# Patient Record
Sex: Female | Born: 1935 | Race: White | Hispanic: No | Smoking: Never smoker
Health system: Southern US, Community
[De-identification: ages and names within clinical notes are randomized; demographics above are authoritative.]

## PROBLEM LIST (undated history)

## (undated) DIAGNOSIS — I4891 Unspecified atrial fibrillation: Secondary | ICD-10-CM

## (undated) DIAGNOSIS — Z950 Presence of cardiac pacemaker: Secondary | ICD-10-CM

## (undated) DIAGNOSIS — I5189 Other ill-defined heart diseases: Secondary | ICD-10-CM

## (undated) DIAGNOSIS — I2510 Atherosclerotic heart disease of native coronary artery without angina pectoris: Secondary | ICD-10-CM

## (undated) DIAGNOSIS — I509 Heart failure, unspecified: Secondary | ICD-10-CM

## (undated) DIAGNOSIS — I495 Sick sinus syndrome: Secondary | ICD-10-CM

## (undated) DIAGNOSIS — F0390 Unspecified dementia without behavioral disturbance: Secondary | ICD-10-CM

## (undated) DIAGNOSIS — K922 Gastrointestinal hemorrhage, unspecified: Secondary | ICD-10-CM

## (undated) DIAGNOSIS — I10 Essential (primary) hypertension: Secondary | ICD-10-CM

## (undated) HISTORY — DX: Essential (primary) hypertension: I10

## (undated) HISTORY — DX: Unspecified atrial fibrillation: I48.91

## (undated) HISTORY — DX: Sick sinus syndrome: I49.5

## (undated) HISTORY — DX: Unspecified dementia, unspecified severity, without behavioral disturbance, psychotic disturbance, mood disturbance, and anxiety: F03.90

## (undated) HISTORY — DX: Heart failure, unspecified: I50.9

## (undated) HISTORY — DX: Gastrointestinal hemorrhage, unspecified: K92.2

## (undated) HISTORY — DX: Other ill-defined heart diseases: I51.89

---

## 2005-08-13 ENCOUNTER — Emergency Department (HOSPITAL_COMMUNITY): Admission: EM | Admit: 2005-08-13 | Discharge: 2005-08-14 | Payer: Self-pay | Admitting: Emergency Medicine

## 2009-05-24 ENCOUNTER — Ambulatory Visit: Payer: Self-pay | Admitting: Infectious Diseases

## 2009-05-24 ENCOUNTER — Ambulatory Visit: Payer: Self-pay | Admitting: Vascular Surgery

## 2009-05-26 ENCOUNTER — Encounter (INDEPENDENT_AMBULATORY_CARE_PROVIDER_SITE_OTHER): Payer: Self-pay | Admitting: Emergency Medicine

## 2009-05-27 ENCOUNTER — Inpatient Hospital Stay (HOSPITAL_COMMUNITY): Admission: EM | Admit: 2009-05-27 | Discharge: 2009-05-28 | Payer: Self-pay | Admitting: Emergency Medicine

## 2009-08-08 ENCOUNTER — Emergency Department (HOSPITAL_COMMUNITY): Admission: EM | Admit: 2009-08-08 | Discharge: 2009-08-08 | Payer: Self-pay | Admitting: Emergency Medicine

## 2009-08-23 ENCOUNTER — Encounter (INDEPENDENT_AMBULATORY_CARE_PROVIDER_SITE_OTHER): Payer: Self-pay | Admitting: Internal Medicine

## 2009-08-23 ENCOUNTER — Inpatient Hospital Stay (HOSPITAL_COMMUNITY): Admission: EM | Admit: 2009-08-23 | Discharge: 2009-09-05 | Payer: Self-pay | Admitting: Emergency Medicine

## 2009-08-26 ENCOUNTER — Ambulatory Visit: Payer: Self-pay | Admitting: Vascular Surgery

## 2009-08-26 ENCOUNTER — Encounter (INDEPENDENT_AMBULATORY_CARE_PROVIDER_SITE_OTHER): Payer: Self-pay | Admitting: Internal Medicine

## 2009-09-02 HISTORY — PX: PACEMAKER INSERTION: SHX728

## 2010-01-09 ENCOUNTER — Inpatient Hospital Stay (HOSPITAL_COMMUNITY): Admission: EM | Admit: 2010-01-09 | Discharge: 2010-01-14 | Payer: Self-pay | Admitting: Emergency Medicine

## 2010-01-13 ENCOUNTER — Encounter (INDEPENDENT_AMBULATORY_CARE_PROVIDER_SITE_OTHER): Payer: Self-pay | Admitting: Internal Medicine

## 2010-04-20 ENCOUNTER — Emergency Department (HOSPITAL_COMMUNITY): Admission: EM | Admit: 2010-04-20 | Discharge: 2010-04-20 | Payer: Self-pay | Admitting: Emergency Medicine

## 2010-04-28 ENCOUNTER — Ambulatory Visit: Payer: Self-pay | Admitting: Cardiology

## 2010-06-06 ENCOUNTER — Encounter: Payer: Self-pay | Admitting: Internal Medicine

## 2010-06-17 ENCOUNTER — Emergency Department (HOSPITAL_COMMUNITY): Admission: EM | Admit: 2010-06-17 | Discharge: 2010-06-17 | Payer: Self-pay | Admitting: Emergency Medicine

## 2010-07-09 ENCOUNTER — Ambulatory Visit: Payer: Self-pay | Admitting: Internal Medicine

## 2010-08-28 ENCOUNTER — Ambulatory Visit: Payer: Self-pay | Admitting: Cardiology

## 2010-09-25 ENCOUNTER — Inpatient Hospital Stay (HOSPITAL_COMMUNITY)
Admission: EM | Admit: 2010-09-25 | Discharge: 2010-09-28 | Payer: Self-pay | Source: Home / Self Care | Attending: Internal Medicine | Admitting: Internal Medicine

## 2010-09-25 LAB — CBC
Hemoglobin: 9.1 g/dL — ABNORMAL LOW (ref 12.0–15.0)
MCHC: 34 g/dL (ref 30.0–36.0)
RBC: 3.14 MIL/uL — ABNORMAL LOW (ref 3.87–5.11)
WBC: 5.4 10*3/uL (ref 4.0–10.5)

## 2010-09-25 LAB — DIFFERENTIAL
Basophils Absolute: 0 10*3/uL (ref 0.0–0.1)
Basophils Relative: 1 % (ref 0–1)
Monocytes Absolute: 0.7 10*3/uL (ref 0.1–1.0)
Neutro Abs: 2.2 10*3/uL (ref 1.7–7.7)
Neutrophils Relative %: 40 % — ABNORMAL LOW (ref 43–77)

## 2010-09-25 LAB — PROTIME-INR
INR: 1.04 (ref 0.00–1.49)
Prothrombin Time: 13.8 seconds (ref 11.6–15.2)

## 2010-09-25 LAB — COMPREHENSIVE METABOLIC PANEL
ALT: 21 U/L (ref 0–35)
Calcium: 8.9 mg/dL (ref 8.4–10.5)
Glucose, Bld: 96 mg/dL (ref 70–99)
Sodium: 134 mEq/L — ABNORMAL LOW (ref 135–145)
Total Protein: 5.7 g/dL — ABNORMAL LOW (ref 6.0–8.3)

## 2010-09-25 LAB — DIGOXIN LEVEL: Digoxin Level: 0.4 ng/mL — ABNORMAL LOW (ref 0.8–2.0)

## 2010-09-25 LAB — OCCULT BLOOD, POC DEVICE: Fecal Occult Bld: POSITIVE

## 2010-09-26 LAB — BASIC METABOLIC PANEL
CO2: 25 mEq/L (ref 19–32)
Calcium: 8.4 mg/dL (ref 8.4–10.5)
Chloride: 105 mEq/L (ref 96–112)
GFR calc Af Amer: >60 mL/min (ref >60)
Glucose, Bld: 103 mg/dL — ABNORMAL HIGH (ref 70–99)
Potassium: 3.7 mEq/L (ref 3.5–5.1)
Sodium: 137 mEq/L (ref 135–145)

## 2010-09-26 LAB — CBC
HCT: 23 % — ABNORMAL LOW (ref 36.0–46.0)
Hemoglobin: 7.7 g/dL — ABNORMAL LOW (ref 12.0–15.0)
RBC: 2.71 MIL/uL — ABNORMAL LOW (ref 3.87–5.11)
WBC: 4.5 10*3/uL (ref 4.0–10.5)

## 2010-09-26 LAB — GLUCOSE, CAPILLARY

## 2010-09-26 LAB — PREPARE RBC (CROSSMATCH)

## 2010-09-27 ENCOUNTER — Encounter: Payer: Self-pay | Admitting: Gastroenterology

## 2010-09-27 LAB — BASIC METABOLIC PANEL
BUN: 10 mg/dL (ref 6–23)
Chloride: 107 mEq/L (ref 96–112)
Creatinine, Ser: 0.45 mg/dL (ref 0.4–1.2)
Glucose, Bld: 78 mg/dL (ref 70–99)
Potassium: 3.6 mEq/L (ref 3.5–5.1)

## 2010-09-27 LAB — TYPE AND SCREEN
ABO/RH(D): O POS
Unit division: 0

## 2010-09-27 LAB — CBC
HCT: 28.1 % — ABNORMAL LOW (ref 36.0–46.0)
HCT: 27.1 % — ABNORMAL LOW (ref 36.0–46.0)
Hemoglobin: 9.5 g/dL — ABNORMAL LOW (ref 12.0–15.0)
MCH: 28.4 pg (ref 26.0–34.0)
MCH: 28.6 pg (ref 26.0–34.0)
MCHC: 33.8 g/dL (ref 30.0–36.0)
MCV: 85.2 fL (ref 78.0–100.0)
Platelets: 158 10*3/uL (ref 150–400)
RBC: 3.34 MIL/uL — ABNORMAL LOW (ref 3.87–5.11)
RDW: 14.8 % (ref 11.5–15.5)
WBC: 4.6 10*3/uL (ref 4.0–10.5)

## 2010-09-28 LAB — CBC
HCT: 32 % — ABNORMAL LOW (ref 36.0–46.0)
MCHC: 33.4 g/dL (ref 30.0–36.0)
MCV: 85.6 fL (ref 78.0–100.0)
Platelets: 205 10*3/uL (ref 150–400)
RDW: 14.6 % (ref 11.5–15.5)

## 2010-09-28 NOTE — H&P (Addendum)
Andrea Koch, Andrea Koch             ACCOUNT NO.:  [id number]  MEDICAL RECORD NO.:  [id number]          PATIENT TYPE:  EMS  LOCATION:  ED                           FACILITY:  [facility]  PHYSICIAN:  Peggye Pitt, M.D. DATE OF BIRTH:  1932/08/06  DATE OF ADMISSION:  09/25/2010 DATE OF DISCHARGE:                             HISTORY & PHYSICAL   PRIMARY CARE PHYSICIAN:  Marjory Lies, M.D.  CHIEF COMPLAINTS:  Melena.  HISTORY OF PRESENT ILLNESS:  Andrea Koch is a 77 year old Caucasian lady with a history of sick sinus syndrome, status post pacemaker implantation and insomnia who presents to the hospital today after PCP told her to come back for a low hemoglobin.  Koch of the history is obtained by the patient's son, Andrea Koch as she is somewhat confused of the time line of events.  The patient had been feeling "sick with the flu" for the past 2 to 3 days and because of this went to see her PCP today.  This morning, she had her first episode of black tarry stools. At Dr. Mellody Life office they did a guaiac which was positive and also did some blood work and the patient was sent home.  The patient's son, Andrea Koch was contacted later this evening and asked to bring his mother to the emergency department for findings of hemoglobin of 9.2.  Because of this, he brought his mother into the emergency department today. Charles phone numbers are [phone], [phone], and [phone].  He has asked that we contact him and there tomorrow once we have details of his mother's hospital course.  ALLERGIES:  SHE HAS ALLERGIES TO SULFA DRUGS WHICH CAUSE A RASH.  PAST MEDICAL HISTORY: 1. Significant for sick sinus syndrome. 2. Bilateral lower extremity edema secondary to venous insufficiency. 3. Anemia of chronic disease. 4. History of atrial fibrillation, not maintained on chronic     anticoagulation with Coumadin given history of falls.  HOME MEDICATIONS: 1. Aspirin 81 mg daily. 2. Klonopin 0.5 mg at  bedtime.  She takes half a tablet. 3. Digoxin 0.125 mg daily. 4. Lasix 20 mg daily as needed for lower extremity swelling. 5. Metoprolol 25 mg twice daily. 6. Fluticasone nasal spray 2 sprays in each nostril daily.  SOCIAL HISTORY:  Denies any alcohol, tobacco or illicit drug use.  Lives with her son, Andrea Koch and his wife Andrea Koch.  FAMILY HISTORY:  Nonsignificant for heart disease, stroke and cancer.  REVIEW OF SYSTEMS:  Negative except as mentioned in the history of present illness.  PHYSICAL EXAMINATION:  VITALS:  On admission blood pressure 131/75, heart rate 82, respirations 16, sats of 100% on room air, temperature 97.4. GENERAL:  She is alert, awake, oriented x3, in no distress. HEENT:  Normocephalic, atraumatic.  Her pupils are equally round and reactive to light. NECK:  Supple.  No JVD, no lymphadenopathy, no bruits, no goiter. HEART:  Irregular rhythm.  I do not auscultate any murmurs, rubs or gallops. LUNGS:  Her lungs appear clear to auscultation bilaterally. ABDOMEN:  Soft, nontender, nondistended.  Positive bowel sounds. EXTREMITIES:  No edema and positive pedal pulses. NEUROLOGIC EXAM:  Grossly intact and nonfocal.  LABORATORY  DATA:  Labs on admission, sodium 134, potassium 3.8, chloride 100, bicarb 29, BUN 44, creatinine 0.50, glucose of 96.  All of her LFTs are within normal limits with the exception of a slightly low albumin at 3.1.  WBC is 5.4, hemoglobin 9.1 with an MCV of 85.4, platelet count of 194.  Her coags are within normal limits.  Her digoxin level is subtherapeutic at 0.4 and her FOBT is positive.  ASSESSMENT AND PLAN: 1. Melena.  At this point, we will place 2 large-bore IVs.  We will     cycle her CBCs every 8 hours.  We will type and cross and hold 2     units of PRBCs.  At this point, I do not believe that she requires     transfusion.  I will transfuse if it drops below 7.  Given the fact     that she is hemodynamically stable and her degree of  anemia is not     that significant, I have decided that there is no need to consult     GI emergently overnight and this consult can be obtained in the     morning.  Possibility of endoscopy has been discussed with the     patient and her son, Andrea Koch and they are in agreement.  Please see     H and P for Gary phone numbers.  He wants to be contacted when     we know of the timing of these procedures if they are to be done.     It is likely that the aspirin that she has been taking long-term     for her atrial fibrillation and heart disease may have caused some     degree of gastritis or peptic ulcer disease.  I will give her     Protonix 40 mg IV b.i.d. for now.  We will keep her n.p.o. for now     in anticipation of a possible procedure in the morning. 2. For deep venous thrombosis prophylaxis, I will place her on SCDs.     Peggye Pitt, M.D.     EH/MEDQ  D:  09/25/2010  T:  09/25/2010  Job:  [id number]  cc:   Marjory Lies, M.D. Fax: [phone]  Electronically Signed by Peggye Pitt M.D. on 09/28/2010 08:11:27 AM

## 2010-09-29 NOTE — Discharge Summary (Signed)
Andrea Koch, Andrea Koch             ACCOUNT NO.:  [id number]  MEDICAL RECORD NO.:  [id number]          PATIENT TYPE:  INP  LOCATION:  1306                         FACILITY:  [facility]  PHYSICIAN:  Andreas Blower, MD       DATE OF BIRTH:  [birth year]  DATE OF ADMISSION:  09/25/2010 DATE OF DISCHARGE:                              DISCHARGE SUMMARY   PRIMARY CARE PROVIDER:  Marjory Lies, MD  DISCHARGE DIAGNOSES: 1. 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 2. Hypotension due to GI bleed and dehydration. 3. History of sick sinus syndrome. 4. Anemia due to gastrointestinal bleed and anemia of chronic disease. 5. Hyponatremia 6. History of atrial fibrillation. 7. Hypertension. 8. Bilateral lower extremity edema secondary to venous insufficiency. 9. Dehydration. 10.Dementia.  DISCHARGE MEDICATIONS: 1. Omeprazole 20 mg p.o. twice daily for 1 month, then daily     thereafter. 2. Metoprolol 12.5 mg p.o. twice daily. 3. Clonazepam 0.25 mg p.o. daily at bedtime. 4. Digoxin 0.125 mg p.o. daily. 5. Fluticasone 50 mcg 2 sprays nasally daily. 6. Furosemide 20 mg p.o. daily.  BRIEF ADMITTING HISTORY AND PHYSICAL:  Andrea Koch is a 77 year old Caucasian female with history of sick sinus syndrome, status post pacemaker placement, who presented with melena and low hemoglobin.  PROCEDURES:  The patient had an EGD on September 27, 2010 which showed a clean based gastric ulcer, otherwise normal examination.  This gastric ulcer was deemed most likely the cause for her melena.  RADIOLOGY/IMAGING:  The patient has no imaging.  LABORATORY DATA:  CBC shows white count of 6.9, hemoglobin 10.7, hematocrit 32.0, platelet count 205.  Initially, hemoglobin on presentation was 9.1, dropped to 7.7.  INR was 1.04.  Electrolytes normal with a BUN of 10, creatinine 0.45.  Liver function tests normal except alk phos was 122, total protein was 5.7, albumin was 3.1, and digoxin was 0.4.  DISPOSITION  AND FOLLOWUP:  The patient to follow with Dr. Charna Elizabeth, Gastroenterology, in approximately 2 months and the patient will need repeat EGD at that time to confirm the ulcer is healing.  The patient will need to follow up with her primary care physician in 1-2 weeks. The patient's primary care physician or Dr. Loreta Ave to address the need as to when the aspirin could be restarted.  Her primary care physician to titrate antihypertensive medications as necessary.  HOSPITAL COURSE BY PROBLEM: 1. GI bleed, melena.  The patient received 2 units of PRBC, had an EGD     on September 27, 2010 which showed a clean based gastric ulcer which     is the most likely the cause for her melena.  The patient also had     biopsies and the results are pending.  GI to follow up on the     biopsies for H pylori.  The patient was started on PPI which she     will continue twice daily for 1 month, then daily thereafter.  Will     need a repeat EGD in 2 months to confirm that the gastric ulcer is     healing. 2. Hypotension secondary to GI bleed and  dehydration, improved with IV     hydration and blood. 3. History of sick sinus syndrome, stable.  Status post pacemaker     placement in the past. 4. Anemia due to chronic disease and GI bleed, hemoglobin stable after     transfusion.  No further episodes of bleed. 5. Hyponatremia, resolved. 6. History of atrial fibrillation.  The patient was rate controlled.     Initially held her metoprolol during the course of hospital stay     due to hypotension.  However, after her blood pressure improved,     metoprolol was restarted at half a dose.  Further titration of     antihypertensive medications to be done as an outpatient, will     defer to her primary care physician. 7. Bilateral lower extremity edema secondary to chronic venous stasis     insufficiency, stable during the hospital stay. 8. Dememtia. Stable.  Time spent on discharge talking to the patient, talking to  the patient's son, and coordinating care was 35 minutes.   Andreas Blower, MD   SR/MEDQ  D:  09/28/2010  T:  09/28/2010  Job:  [id number]  cc:   Marjory Lies, M.D. Fax: [phone]  Electronically Signed by Wardell Heath Tatia Petrucci  on 09/29/2010 10:40:39 PM

## 2010-09-29 NOTE — Procedures (Signed)
Summary: pacer ck medtroinc/mt   Current Medications (verified): 1)  Digoxin 0.125 Mg Tabs (Digoxin) .... One By Mouth Daily 2)  Metoprolol Tartrate 25 Mg Tabs (Metoprolol Tartrate) .... One By Mouth Two Times A Day 3)  Aspir-Low 81 Mg Tbec (Aspirin) .... One By Mouth Daily  Allergies (verified): 1)  ! Sulfa  PPM Specifications Following MD:  Hillis Range, MD     Referring MD:  Rolla Plate PPM Vendor:  Medtronic     PPM Model Number:  [id number]     PPM Serial Number:  [id number] PPM DOI:  09/02/2009     PPM Implanting MD:  Rolla Plate  Lead 1    Location: RA     DOI: 09/02/2009     Model #: [id number]     Serial #: [id number]     Status: active Lead 2    Location: RV     DOI: 09/02/2009     Model #: [id number]     Serial #: [id number]     Status: active  Magnet Response Rate:  BOL 85 ERI 65  Indications:  A-fib   PPM Follow Up Remote Check?  No Battery Voltage:  2.79 V     Battery Est. Longevity:  11 years     Pacer Dependent:  Yes       PPM Device Measurements Atrium  Impedance: 654 ohms, Threshold: 0.375 V at 0.4 msec Right Ventricle  Amplitude: 8.0 mV, Impedance: 548 ohms, Threshold: 0.875 V at 0.4 msec  Episodes MS Episodes:  41     Percent Mode Switch:  0.5%     Coumadin:  No Ventricular High Rate:  0     Atrial Pacing:  99.1%     Ventricular Pacing:  1.8%  Parameters Mode:  DDDR     Lower Rate Limit:  60     Upper Rate Limit:  130 Paced AV Delay:  150     Sensed AV Delay:  120 Next Cardiology Appt Due:  12/29/2010 Tech Comments:  No parameter changes. Mode switch episodes 0.5%,- coumadin.  No Carelink @ this time.  ROV 6months with Dr. Johney Frame. Altha Harm, LPN  July 09, 2010 4:42 PM

## 2010-09-29 NOTE — Cardiovascular Report (Signed)
Summary: Office Visit   Office Visit   Imported By: Roderic Ovens 07/20/2010 15:47:02  _____________________________________________________________________  External Attachment:    Type:   Image     Comment:   External Document

## 2010-09-29 NOTE — Miscellaneous (Signed)
Summary: Device preload  Clinical Lists Changes  Observations: Added new observation of PPM INDICATN: A-fib (06/06/2010 15:45) Added new observation of MAGNET RTE: BOL 85 ERI 65 (06/06/2010 15:45) Added new observation of PPMLEADSTAT2: active (06/06/2010 15:45) Added new observation of PPMLEADSER2: [id number] (06/06/2010 15:45) Added new observation of PPMLEADMOD2: 5076  (06/06/2010 15:45) Added new observation of PPMLEADDOI2: 09/02/2009  (06/06/2010 15:45) Added new observation of PPMLEADLOC2: RV  (06/06/2010 15:45) Added new observation of PPMLEADSTAT1: active  (06/06/2010 15:45) Added new observation of PPMLEADSER1: [id number]  (06/06/2010 15:45) Added new observation of PPMLEADMOD1: 5076  (06/06/2010 15:45) Added new observation of PPMLEADDOI1: 09/02/2009  (06/06/2010 15:45) Added new observation of PPMLEADLOC1: RA  (06/06/2010 15:45) Added new observation of PPM IMP MD: Duffy Rhody Tennant,MD  (06/06/2010 15:45) Added new observation of PPM DOI: 09/02/2009  (06/06/2010 15:45) Added new observation of PPM SERL#: [id number]  (06/06/2010 15:45) Added new observation of PPM MODL#: [id number]  (06/06/2010 96:29) Added new observation of PACEMAKERMFG: Medtronic  (06/06/2010 15:45) Added new observation of PPM REFER MD: Duffy Rhody Tennant,MD  (06/06/2010 15:45) Added new observation of PACEMAKER MD: Hillis Range, MD  (06/06/2010 15:45)      PPM Specifications Following MD:  Hillis Range, MD     Referring MD:  Rolla Plate PPM Vendor:  Medtronic     PPM Model Number:  [id number]     PPM Serial Number:  [id number] PPM DOI:  09/02/2009     PPM Implanting MD:  Rolla Plate  Lead 1    Location: RA     DOI: 09/02/2009     Model #: [id number]     Serial #: [id number]     Status: active Lead 2    Location: RV     DOI: 09/02/2009     Model #: [id number]     Serial #: [id number]     Status: active  Magnet Response Rate:  BOL 85 ERI 65  Indications:  A-fib

## 2010-09-30 DIAGNOSIS — K922 Gastrointestinal hemorrhage, unspecified: Secondary | ICD-10-CM

## 2010-09-30 HISTORY — DX: Gastrointestinal hemorrhage, unspecified: K92.2

## 2010-10-06 ENCOUNTER — Emergency Department (HOSPITAL_COMMUNITY)
Admission: EM | Admit: 2010-10-06 | Discharge: 2010-10-07 | Disposition: A | Discharge status: Nursing Home (Includes ICF) | Payer: Medicare Other | Source: Home / Self Care | Attending: Emergency Medicine | Admitting: Emergency Medicine

## 2010-10-06 DIAGNOSIS — I10 Essential (primary) hypertension: Secondary | ICD-10-CM | POA: Insufficient documentation

## 2010-10-06 DIAGNOSIS — I509 Heart failure, unspecified: Secondary | ICD-10-CM | POA: Insufficient documentation

## 2010-10-06 DIAGNOSIS — Z79899 Other long term (current) drug therapy: Secondary | ICD-10-CM | POA: Insufficient documentation

## 2010-10-06 DIAGNOSIS — K922 Gastrointestinal hemorrhage, unspecified: Secondary | ICD-10-CM | POA: Insufficient documentation

## 2010-10-06 DIAGNOSIS — R195 Other fecal abnormalities: Secondary | ICD-10-CM | POA: Insufficient documentation

## 2010-10-06 LAB — COMPREHENSIVE METABOLIC PANEL
ALT: 19 U/L (ref 0–35)
Alkaline Phosphatase: 118 U/L — ABNORMAL HIGH (ref 39–117)
CO2: 30 mEq/L (ref 19–32)
Chloride: 99 mEq/L (ref 96–112)
Glucose, Bld: 87 mg/dL (ref 70–99)
Potassium: 4.1 mEq/L (ref 3.5–5.1)
Sodium: 134 mEq/L — ABNORMAL LOW (ref 135–145)
Total Bilirubin: 0.6 mg/dL (ref 0.3–1.2)
Total Protein: 6.2 g/dL (ref 6.0–8.3)

## 2010-10-06 LAB — PROTIME-INR: Prothrombin Time: 14.3 seconds (ref 11.6–15.2)

## 2010-10-06 LAB — CBC
MCH: 28.3 pg (ref 26.0–34.0)
MCHC: 32.6 g/dL (ref 30.0–36.0)
Platelets: 255 10*3/uL (ref 150–400)
RDW: 14.2 % (ref 11.5–15.5)

## 2010-10-06 LAB — DIFFERENTIAL
Basophils Absolute: 0 10*3/uL (ref 0.0–0.1)
Basophils Relative: 1 % (ref 0–1)
Eosinophils Absolute: 0.3 10*3/uL (ref 0.0–0.7)
Eosinophils Relative: 8 % — ABNORMAL HIGH (ref 0–5)
Monocytes Absolute: 0.5 10*3/uL (ref 0.1–1.0)
Monocytes Relative: 12 % (ref 3–12)

## 2010-10-07 ENCOUNTER — Inpatient Hospital Stay (HOSPITAL_COMMUNITY)
Admission: EM | Admit: 2010-10-07 | Discharge: 2010-10-10 | DRG: 378 | Disposition: A | Discharge status: Skilled Nursing Facility | Payer: Medicare Other | Source: Ambulatory Visit | Attending: Internal Medicine | Admitting: Internal Medicine

## 2010-10-07 DIAGNOSIS — I509 Heart failure, unspecified: Secondary | ICD-10-CM | POA: Diagnosis present

## 2010-10-07 DIAGNOSIS — E871 Hypo-osmolality and hyponatremia: Secondary | ICD-10-CM | POA: Diagnosis present

## 2010-10-07 DIAGNOSIS — I495 Sick sinus syndrome: Secondary | ICD-10-CM | POA: Diagnosis present

## 2010-10-07 DIAGNOSIS — E876 Hypokalemia: Secondary | ICD-10-CM | POA: Diagnosis present

## 2010-10-07 DIAGNOSIS — Z950 Presence of cardiac pacemaker: Secondary | ICD-10-CM

## 2010-10-07 DIAGNOSIS — A048 Other specified bacterial intestinal infections: Secondary | ICD-10-CM | POA: Diagnosis present

## 2010-10-07 DIAGNOSIS — Z781 Physical restraint status: Secondary | ICD-10-CM | POA: Diagnosis not present

## 2010-10-07 DIAGNOSIS — I10 Essential (primary) hypertension: Secondary | ICD-10-CM | POA: Diagnosis present

## 2010-10-07 DIAGNOSIS — D62 Acute posthemorrhagic anemia: Secondary | ICD-10-CM | POA: Diagnosis present

## 2010-10-07 DIAGNOSIS — F0390 Unspecified dementia without behavioral disturbance: Secondary | ICD-10-CM | POA: Diagnosis present

## 2010-10-07 DIAGNOSIS — I4891 Unspecified atrial fibrillation: Secondary | ICD-10-CM | POA: Diagnosis not present

## 2010-10-07 DIAGNOSIS — K254 Chronic or unspecified gastric ulcer with hemorrhage: Principal | ICD-10-CM | POA: Diagnosis present

## 2010-10-07 DIAGNOSIS — R109 Unspecified abdominal pain: Secondary | ICD-10-CM | POA: Diagnosis present

## 2010-10-07 LAB — RENAL FUNCTION PANEL
Albumin: 3.3 g/dL — ABNORMAL LOW (ref 3.5–5.2)
BUN: 12 mg/dL (ref 6–23)
Creatinine, Ser: 0.53 mg/dL (ref 0.4–1.2)
Phosphorus: 3 mg/dL (ref 2.3–4.6)
Potassium: 3.9 mEq/L (ref 3.5–5.1)

## 2010-10-07 LAB — CBC
MCV: 87.2 fL (ref 78.0–100.0)
Platelets: 232 10*3/uL (ref 150–400)
RDW: 14.3 % (ref 11.5–15.5)
WBC: 4.2 10*3/uL (ref 4.0–10.5)

## 2010-10-07 LAB — TYPE AND SCREEN: Antibody Screen: NEGATIVE

## 2010-10-07 NOTE — Procedures (Signed)
Summary: Upper Endoscopy  Patient: [name] Note: All result statuses are Final unless otherwise noted.  Tests: (1) Upper Endoscopy (EGD)   EGD Upper Endoscopy       DONE     [facility]     [address] [place], [place]  [address]           ENDOSCOPY PROCEDURE REPORT           PATIENT:  Andrea, Koch  MR#:  [id number]     BIRTHDATE:  15-Dec-1931, 78 yrs. old  GENDER:  female     ENDOSCOPIST:  Rachael Fee, MD (covering Dr. Loreta Ave this weekend     for "unassigned at [facility]")     PROCEDURE DATE:  09/27/2010     PROCEDURE:  EGD with biopsy, [address]     ASA CLASS:  Class II     INDICATIONS:  melena, anemia     MEDICATIONS:  Fentanyl 50 mcg IV, Versed 4 mg IV     TOPICAL ANESTHETIC:  none     DESCRIPTION OF PROCEDURE:   After the risks benefits and     alternatives of the procedure were thoroughly explained, informed     consent was obtained.  The Pentax Gastroscope [id number] endoscope     was introduced through the mouth and advanced to the second     portion of the duodenum, without limitations.  The instrument was     slowly withdrawn as the mucosa was fully examined.     <<PROCEDUREIMAGES>>     There was a benign appearing antral ulcer that measured 1cm     across. The ulcer was shallow, clean based without bleeding.     Surrounding mucosa was edematous. There was mild, non-specific pan     gastritis that was biopsied to check for H. pylori (see image6 and     image2).  Otherwise the examination was normal (see image3,     image4, and image1).    Retroflexed views revealed no     abnormalities.    The scope was then withdrawn from the patient     and the procedure completed.     COMPLICATIONS:  None           ENDOSCOPIC IMPRESSION:     1) Clean based gastric ulcer. This is the cause of her melena.     Gastritis, biopsied to check for H. pylori.     2) Otherwise normal examination           RECOMMENDATIONS:     If biopsies show H. pylori, she will be  started on appropriate     antibiotics.     She should stay on PPI twice daily for one month, then OK to     decrease to once daily indefinitely.     She will need repeat EGD to confirm ulcer healing in about 2     months.  I will leave this to Dr. Loreta Ave for whom I am covering this     weekend.           ______________________________     Rachael Fee, MD           n.     eSIGNED:   Rachael Fee at 09/27/2010 08:30 AM           [name], [place], [id number]  Note: An exclamation mark (!) indicates a result that was not dispersed into the flowsheet. Document Creation  Date: 09/27/2010 8:31 AM _______________________________________________________________________  (1) Order result status: Final Collection or observation date-time: 09/27/2010 08:22 Requested date-time:  Receipt date-time:  Reported date-time:  Referring Physician:   Ordering Physician: Rob Bunting [phone]) Specimen Source:  Source: Launa Grill Order Number: [id number] Lab site:

## 2010-10-08 LAB — CBC
Hemoglobin: 10.5 g/dL — ABNORMAL LOW (ref 12.0–15.0)
MCH: 28.5 pg (ref 26.0–34.0)
MCV: 87.5 fL (ref 78.0–100.0)
RBC: 3.68 MIL/uL — ABNORMAL LOW (ref 3.87–5.11)

## 2010-10-08 LAB — BASIC METABOLIC PANEL
BUN: 4 mg/dL — ABNORMAL LOW (ref 6–23)
CO2: 26 mEq/L (ref 19–32)
Chloride: 104 mEq/L (ref 96–112)
Creatinine, Ser: 0.5 mg/dL (ref 0.4–1.2)

## 2010-10-09 LAB — BASIC METABOLIC PANEL
BUN: 4 mg/dL — ABNORMAL LOW (ref 6–23)
CO2: 25 mEq/L (ref 19–32)
Calcium: 8.4 mg/dL (ref 8.4–10.5)
GFR calc non Af Amer: >60 mL/min (ref >60)
Glucose, Bld: 80 mg/dL (ref 70–99)

## 2010-10-09 LAB — CBC
HCT: 35.1 % — ABNORMAL LOW (ref 36.0–46.0)
Hemoglobin: 11.4 g/dL — ABNORMAL LOW (ref 12.0–15.0)
MCH: 28.6 pg (ref 26.0–34.0)
MCHC: 32.5 g/dL (ref 30.0–36.0)
MCV: 88 fL (ref 78.0–100.0)
RDW: 14.3 % (ref 11.5–15.5)

## 2010-10-09 LAB — DIGOXIN LEVEL: Digoxin Level: <0.2 ng/mL — ABNORMAL LOW (ref 0.8–2.0)

## 2010-10-10 LAB — BASIC METABOLIC PANEL
BUN: 7 mg/dL (ref 6–23)
CO2: 28 mEq/L (ref 19–32)
Calcium: 8.3 mg/dL — ABNORMAL LOW (ref 8.4–10.5)
GFR calc non Af Amer: >60 mL/min (ref >60)
Glucose, Bld: 85 mg/dL (ref 70–99)
Potassium: 4.3 mEq/L (ref 3.5–5.1)
Sodium: 138 mEq/L (ref 135–145)

## 2010-10-10 LAB — CBC
HCT: 31.1 % — ABNORMAL LOW (ref 36.0–46.0)
Hemoglobin: 10.1 g/dL — ABNORMAL LOW (ref 12.0–15.0)
MCHC: 32.5 g/dL (ref 30.0–36.0)
MCV: 87.1 fL (ref 78.0–100.0)
RDW: 14.2 % (ref 11.5–15.5)

## 2010-10-29 NOTE — Consult Note (Signed)
NAME:  Andrea Koch, Andrea Koch             ACCOUNT NO.:  [id number]  MEDICAL RECORD NO.:  [id number]           PATIENT TYPE:  I  LOCATION:  6706                         FACILITY:  [facility]  PHYSICIAN:  Bernette Redbird, M.D.   DATE OF BIRTH:  June 21, 1932  DATE OF CONSULTATION:  10/07/2010 DATE OF DISCHARGE:                                CONSULTATION   GASTROENTEROLOGY CONSULTATION  Dr. Ladell Pier of the [facility] asked [place] to see this 77 year old female because of GI bleeding.  The patient was discharged from the hospital about 10 days ago following an upper GI bleed, characterized by heme-positive stool and drop in hemoglobin.  At that time, the patient had been on a daily 81 mg aspirin and was discharged on omeprazole 20 mg p.o. b.i.d.  Biopsies of the antrum at that time, performed by another gastroenterologist as an unassigned patient, showed H. pylori infection.  The patient was brought to the hospital today because of a couple of dark stools.  The history is obtained from the chart and the attending physician since the patient herself is currently disoriented.  Of note, the patient's hemoglobin on admission and following admission is actually higher than it was 10 days ago at the time of hospital discharge, and her BUN is in the normal range, arguing against an active upper GI bleed.  The patient was not sent home on aspirin but at the moment, it is not 100% clear whether or not the patient was possibly taking aspirin following her discharge.  Allergy to SULFA.  OUTPATIENT MEDICATIONS:  Theoretically: 1. Omeprazole 20 b.i.d. 2. Metoprolol 12.5 mg b.i.d. 3. Clonazepam 0.25 mg nightly. 4. Digoxin 0.125 mg daily. 5. Fluticasone nasal spray daily. 6. Furosemide 20 mg daily.  MEDICAL ILLNESSES: 1. Sick sinus syndrome status post pacemaker. 2. History of atrial fibrillation, not on chronic anticoagulation, but     on aspirin up until her last hospitalization a week  ago. 3. Hypertension. 4. CHF. 5. Bilateral chronic venous insufficiency. 6. Anemia.  It is noted that the patient's hemoglobin in May 2011,     approximately 9 months ago, was 10.0, so she does run a chronic     anemia.  PHYSICAL EXAMINATION:  GENERAL:  This is a pleasant Caucasian female, eating her clear liquid supper at the time of my evaluation.  She is anicteric and without frank pallor. SKIN:  Warm and dry and the radial pulses full. VITAL SIGNS:  Blood pressure 109/43, pulse 67, temperature 98.2. HEENT:  Anicteric, no frank pallor. CHEST:  Clear anteriorly. HEART:  Without murmurs or arrhythmias. ABDOMEN:  Minimally adipose, nontender. NEUROLOGIC:  Mental status at this time is oriented to name only, not oriented to place, year, or situation.  LABORATORY DATA:  Hemoglobin 10.9 in the emergency room yesterday, 11.2 this morning, 10.1 on recheck.  BUN was 18 last night in the emergency room and is 12 after overnight hydration.  Liver chemistries essentially normal, albumin 3.3.  Fecal occult blood test positive.  IMPRESSION: 1. History of dark stool. 2. Heme-positive stool. 3. Recent gastrointestinal bleed, approximately 10 days ago, due to an  antral ulcer associated with chronic low-dose aspirin therapy     without proton pump inhibitor coverage. 4. Posthemorrhagic anemia, chronic, stable from last admission.  DISCUSSION:  The patient does not appear to have had a clinically significant GI bleed in view of the fact that her hemoglobin is at baseline and not appreciably changed from the time of her last admission, plus her BUN did not go up significantly.  The heme positivity could reflect low-grade GI blood loss or simply residual blood from her recent hospitalization, depending on the frequency of her bowel movements.  I wonder about the possibility whether she could be inadvertently getting aspirin, although she was not discharged on that following her recent GI  bleed.  RECOMMENDATIONS: 1. I do not feel that endoscopy is needed at this time in view of her     stable hemoglobin and normal BUN. 2. I do certainly agree with continued high-dose PPI therapy.  She can     probably be converted to oral therapy in the morning if she is     stable. 3. For safety sake, I will add sucralfate as an adjunctive anti-peptic     agent at least while she is in-house and is confirmed to be stable. 4. I would probably keep this patient off aspirin therapy for a month,     and thereafter, we would keep her on prophylactic PPI therapy at     least once daily.  If resumption of aspirin prophylaxis is felt to     be clinically necessary, in which case I would use the lowest dose     of aspirin that would be thought to be clinically effective. 5. The patient was found to be H. pylori positive at the time of her     last visit.  Since it is thought that H. pylori may be a     facilitative factor in the development of aspirin-induced     ulceration, it would probably be prudent to try to eradicate it, so     I agree with the attempt to treat it while in the hospital.  To     help lessen the risk of antibiotic associated C. diff colitis, I     will add Florastor probiotic therapy to her regimen and we would     recommend that this be continued for approximately 2 weeks     following the completion of her H. pylori     therapy.  The patient should probably have followup endoscopy in 2     months and it appears that Dr. Christella Hartigan has planned for Dr. Loreta Ave to     do this, although Dr. Loreta Ave declined to see the patient on this     hospitalization as an "unassigned" the patient, so I will put her     on my followup list for that purpose.          ______________________________ Bernette Redbird, M.D.     RB/MEDQ  D:  10/07/2010  T:  10/08/2010  Job:  [id number]  cc:   Marjory Lies, M.D.  Electronically Signed by Bernette Redbird M.D. on 10/29/2010 07:57:00 AM

## 2010-11-02 NOTE — H&P (Signed)
NAMERUCHA, WISSINGER             ACCOUNT NO.:  [id number]  MEDICAL RECORD NO.:  [id number]           PATIENT TYPE:  I  LOCATION:  6706                         FACILITY:  [facility]  PHYSICIAN:  Della Goo, M.D. DATE OF BIRTH:  [birth year]  DATE OF ADMISSION:  10/07/2010 DATE OF DISCHARGE:                             HISTORY & PHYSICAL   DATE OF ADMISSION:  October 07, 2010.  PRIMARY CARE PHYSICIAN:  Marjory Lies, M.D.  CHIEF COMPLAINT:  Black stools.  HISTORY OF PRESENT ILLNESS:  This is a 77 year old female who presents to the emergency department secondary to complaints of passing 2 black tarry stools in the afternoon.  The patient reports having some lower abdominal discomfort.  She denies having any weakness or dizziness or shortness of breath or chest pain.  She denies having any nausea, vomiting, or hematemesis.  The patient was recently hospitalized January 21 until January 30 for a GI bleed where she underwent a workup which included an EGD performed by Dr. Christella Hartigan which revealed a gastric ulcer. The patient was started on treatment with a proton pump inhibitor and H. Pylori studies were done at that time.  The results of H. Pylori studies have returned and are positive.  PAST MEDICAL HISTORY:  Significant for; 1. Sick sinus syndrome, status post pacemaker placement. 2. Atrial fibrillation, but not on anticoagulation with Coumadin     secondary to falls and now because of GI bleeding. 3. Hypertension. 4. Congestive heart failure syndrome. 5. Chronic venous insufficiency of bilateral lower extremities. 6. Anemia.  PAST SURGICAL HISTORY:  History of surgeries to the left lower extremities secondary to a complex laceration.  MEDICATIONS:  Her medications will need to be further verified but the medications on discharge were; 1. Omeprazole 20 mg one p.o. b.i.d. for 1 month and once daily. 2. Metoprolol 12.5 mg one p.o. b.i.d. 3. Clonazepam 0.25 mg one p.o.  q.h.s. 4. Digoxin 0.125 mg one p.o. daily. 5. Fluticasone nasal spray 2 sprays each nostril daily. 6. Furosemide 20 mg 1 p.o. daily.  ALLERGIES:  To SULFA causing a rash.  SOCIAL HISTORY:  Negative for tobacco, alcohol, or illicit drug usage. She lives with her son and daughter-in-law.  FAMILY HISTORY:  Positive for cancer, unknown type in her mother.  REVIEW OF SYSTEMS:  Pertinents as mentioned above.  PHYSICAL EXAMINATION FINDINGS:  GENERAL:  This is an elderly obese 14- year-old Caucasian female who is currently in no acute distress. VITAL SIGNS:  Her temperature 97.5, blood pressure 119/74, heart rate 62, respirations 22, O2 sats 99%. HEENT EXAMINATION:  Normocephalic, atraumatic.  Pupils equally round and reactive to light.  Extraocular movements are intact.  Funduscopic benign.  There is no scleral icterus.  Nares are patent bilaterally. Oropharynx is clear.  The patient has poor sparse dentition. NECK:  Supple.  Full range of motion.  No thyromegaly, adenopathy, jugular venous distention.  No carotid bruits.  CARDIOVASCULAR:  Regular rate and rhythm.  No murmurs, gallops, or rubs appreciated. LUNGS:  Clear to auscultation bilaterally.  No rales, rhonchi, wheezes. ABDOMEN:  Positive bowel sounds.  Soft, nontender, nondistended.  No hepatosplenomegaly.  No rebound or guarding.  EXTREMITIES:  Without cyanosis, clubbing.  She does have some venous stasis changes and has 2+ edema in the bilateral lower extremities which is chronic. NEUROLOGIC EXAMINATION:  The patient is alert and oriented x3.  Her cranial nerves are intact.  There are no focal deficits on examination. RECTAL EXAMINATION:  Performed by the EDP, found to be Hemoccult positive.  LABORATORY STUDIES:  White blood cell count 4.5, hemoglobin 10.9, hematocrit 33.4, MCV 86.8, platelets 255,000, neutrophils 44%, lymphocytes 36%.  Sodium 134, potassium 4.1, chloride 99, carbon dioxide 30, BUN 18, creatinine 0.61,  glucose 87.  Protime 14.3, INR 1.09, and PTT 37.  ASSESSMENT:  A 77 year old female being admitted with; 1. Gastrointestinal bleeding/melena. 2. Anemia secondary gastrointestinal bleeding. 3. Abdominal pain. 4. Mild hyponatremia.  PLAN:  The patient will be admitted.  She has requested to transfer to the [facility].  The patient will be transferred to [facility] hospitalist.  A type and screen has been sent and the patient has been placed on IV fluids for fluid resuscitation at this time and serial H and H will be performed.  The patient will be placed on clear liquids at this time and an IV Protonix drip has been ordered.  [facility] will be notified of the patient's admission.  Her regular medications will be reconciled and DVT prophylaxis with SCDs have been ordered.  Further workup will ensue pending results of the patient's clinical course and her studies.     Della Goo, M.D.     HJ/MEDQ  D:  10/07/2010  T:  10/07/2010  Job:  [id number]  cc:   Marjory Lies, M.D. Fax: [phone]  Electronically Signed by Della Goo M.D. on 11/02/2010 08:12:27 PM

## 2010-11-05 NOTE — Discharge Summary (Signed)
Andrea Koch, Andrea Koch             ACCOUNT NO.:  [id number]  MEDICAL RECORD NO.:  [id number]           PATIENT TYPE:  I  LOCATION:  6706                         FACILITY:  [facility]  PHYSICIAN:  Ladell Pier, M.D.   DATE OF BIRTH:  Mar 19, 1932  DATE OF ADMISSION:  10/07/2010 DATE OF DISCHARGE:  10/10/2010                              DISCHARGE SUMMARY   DISCHARGE DIAGNOSES: 1. Gastrointestinal bleed/melena secondary to gastric ulcer. 2. Acute blood loss anemia. 3. Abdominal pain. 4. Mild hyponatremia. 5. Sick sinus syndrome status post pacemaker placement. 6. Atrial fibrillation, not a Coumadin candidate secondary to falls     and now gastrointestinal bleed. 7. Hypertension. 8. Congestive heart failure syndrome. 9. Chronic venous insufficiency of bilateral lower extremity. 10.Anemia. 11.Helicobacter pylori positive infection. 12.Dementia.  DISCHARGE MEDICATIONS: 1. Amoxicillin 1000 mg twice daily for 11 days. 2. Clarithromycin 500 mg twice daily for 11 days. 3. Prevacid 30 mg twice daily for 2 months until follow up with GI. 4. Sucralfate 1 g/10 mL, 1 gram 4 times daily for 1 week. 5. Clonazepam half tablet daily at bedtime. 6. Digoxin 0.125 mg every other day while on Prevpac and then back to     daily. 7. Fluticasone nasal spray 50 mcg 2 sprays per nostril daily as     needed. 8. Metoprolol 12.5 mg twice daily, hold for systolic blood pressure     less than 110. 9. Lasix 20 mg every other day.  FOLLOW-UP APPOINTMENT: 1. The patient is to follow up with Dr. Matthias Hughs.  Dr. Donavan Burnet office     will call the patient for an appointment.  The patient have repeat     upper endoscopy in 2 months. 2. Follow up with Dr. Doristine Counter in 1 week.  PROCEDURES:  None.  CONSULTANTS:  [facility] Dr. Matthias Hughs.  HISTORY OF PRESENT ILLNESS:  The patient is a 77 year old female, who presents to the emergency department secondary to complaints of passing two black tarry stools in the  afternoon. The patient reports having some lower abdominal discomfort.  She denies having any weakness or dizziness or shortness of breath or chest pain.  She denies having any nausea, vomiting, or hematemesis.  The patient was recently hospitalized on January 21 until January 30 for GI bleed where she underwent workup with EGD by Dr. Christella Hartigan, which revealed a gastric also.  The patient was started on treatment with PPI.  H. pylori test was done, which now comes back and his positive.  Please see admission note for remainder of history.  Past medical history, family history, social history, meds, allergies, review of systems per admission H and P.  PHYSICAL EXAM:  VITAL SIGNS:  At the time of discharge, temperature 98.1, pulse 69, respirations 18, blood pressure 95/56, pulse ox 98% on room air. GENERAL:  The patient is sitting up in bed, well-nourished white female. HEENT:  Normocephalic, atraumatic.  Pupils are reactive to light without erythema.  Poor dentition. CARDIOVASCULAR:  Regular rate and rhythm. LUNGS:  Clear bilaterally. ABDOMEN:  Soft, nontender, nondistended.  Positive bowel sounds. EXTREMITIES:  Trace edema bilaterally.  HOSPITAL COURSE: 1. GI bleed secondary to  gastric ulcer.  The patient was admitted to     the hospital, given IV fluids.  Hemoglobin remained stable.  GI Dr.     Matthias Hughs was consulted and think that since the patient just had a     EGD and is not actively bleeding at this time, to treat her     conservatively with sucralfate, a PPI, and to treat her H. pylori     infection and repeat endoscopy in 2 months.  The patient remained     stable.  Hemoglobin remained stable.  The patient was treated     accordingly and will be discharged with the treatments and to     follow up with Dr. Matthias Hughs in 2 months.  This was discussed with     the patient's son. 2. Acute blood loss anemia.  Hemoglobin is now stable.  No further     bleeding noted. 3. AFib:  The  patient is on metoprolol for atrial fibrillation;     however, her blood pressure has been running low in the 120s to     upper 90s, so discussed with son to check a blood pressure prior to     giving medication, and if blood pressure is less than 110, to hold     onto her metoprolol also with the digoxin, we will change to every     other day while she is on Prevpac as per pharmacy, and the     clarithromycin can increase the digoxin level; however, checking     the digoxin level it is low, but I did not rate, we will change     digoxin to every other day while she is on the Prevpac and then go     back to daily while when she completes that, also the Lasix since     her blood pressure is running low, we will change it to every other     day, I think she takes that for CHF syndrome.  DISCHARGE LABS:  Sodium 138, potassium 4.3, chloride 107, CO2 of 28, glucose 85, BUN 7, creatinine 0.51.  WBC 5.2, hemoglobin 10.1, MCV 87.1, platelets 218.  Digoxin level less than 0.2.  Time spent with the patient and discussing treatment plan with her son doing is approximately 45 minutes.     Ladell Pier, M.D.     N[place]/MEDQ  D:  10/10/2010  T:  10/10/2010  Job:  [id number]  cc:   Marjory Lies, M.D. Bernette Redbird, M.D.  Electronically Signed by Ladell Pier M.D. on 11/05/2010 07:53:39 AM

## 2010-11-11 LAB — URINE CULTURE: Culture: NO GROWTH

## 2010-11-11 LAB — CBC
HCT: 36.2 % (ref 36.0–46.0)
MCHC: 33.3 g/dL (ref 30.0–36.0)
MCV: 86.5 fL (ref 78.0–100.0)
Platelets: 182 10*3/uL (ref 150–400)
RDW: 13.5 % (ref 11.5–15.5)

## 2010-11-11 LAB — URINALYSIS, ROUTINE W REFLEX MICROSCOPIC
Bilirubin Urine: NEGATIVE
Hgb urine dipstick: NEGATIVE
Ketones, ur: NEGATIVE mg/dL
Protein, ur: NEGATIVE mg/dL
Specific Gravity, Urine: 1.018 (ref 1.005–1.030)
Urobilinogen, UA: 1 mg/dL (ref 0.0–1.0)

## 2010-11-11 LAB — DIFFERENTIAL
Basophils Absolute: 0.2 10*3/uL — ABNORMAL HIGH (ref 0.0–0.1)
Basophils Relative: 2 % — ABNORMAL HIGH (ref 0–1)
Eosinophils Absolute: 0 10*3/uL (ref 0.0–0.7)
Eosinophils Relative: 1 % (ref 0–5)
Monocytes Absolute: 0.4 10*3/uL (ref 0.1–1.0)

## 2010-11-11 LAB — BASIC METABOLIC PANEL
BUN: 17 mg/dL (ref 6–23)
Chloride: 99 mEq/L (ref 96–112)
Creatinine, Ser: 0.56 mg/dL (ref 0.4–1.2)
Glucose, Bld: 101 mg/dL — ABNORMAL HIGH (ref 70–99)
Potassium: 4.3 mEq/L (ref 3.5–5.1)

## 2010-11-11 LAB — DIGOXIN LEVEL: Digoxin Level: 0.7 ng/mL — ABNORMAL LOW (ref 0.8–2.0)

## 2010-11-15 LAB — BASIC METABOLIC PANEL
BUN: 4 mg/dL — ABNORMAL LOW (ref 6–23)
BUN: 8 mg/dL (ref 6–23)
BUN: 9 mg/dL (ref 6–23)
Calcium: 7.7 mg/dL — ABNORMAL LOW (ref 8.4–10.5)
Calcium: 8 mg/dL — ABNORMAL LOW (ref 8.4–10.5)
Chloride: 105 mEq/L (ref 96–112)
Creatinine, Ser: 0.5 mg/dL (ref 0.4–1.2)
Creatinine, Ser: 0.52 mg/dL (ref 0.4–1.2)
GFR calc Af Amer: >60 mL/min (ref >60)
GFR calc Af Amer: >60 mL/min (ref >60)
GFR calc non Af Amer: >60 mL/min (ref >60)
GFR calc non Af Amer: >60 mL/min (ref >60)
GFR calc non Af Amer: >60 mL/min (ref >60)
GFR calc non Af Amer: >60 mL/min (ref >60)
Glucose, Bld: 104 mg/dL — ABNORMAL HIGH (ref 70–99)
Potassium: 3.5 mEq/L (ref 3.5–5.1)
Potassium: 3.9 mEq/L (ref 3.5–5.1)
Potassium: 4 mEq/L (ref 3.5–5.1)
Sodium: 135 mEq/L (ref 135–145)
Sodium: 135 mEq/L (ref 135–145)

## 2010-11-15 LAB — PHOSPHORUS
Phosphorus: 2.2 mg/dL — ABNORMAL LOW (ref 2.3–4.6)
Phosphorus: 2.6 mg/dL (ref 2.3–4.6)

## 2010-11-15 LAB — CBC
HCT: 34.9 % — ABNORMAL LOW (ref 36.0–46.0)
HCT: 35.7 % — ABNORMAL LOW (ref 36.0–46.0)
Hemoglobin: 11.4 g/dL — ABNORMAL LOW (ref 12.0–15.0)
Hemoglobin: 11.9 g/dL — ABNORMAL LOW (ref 12.0–15.0)
MCV: 78.9 fL (ref 78.0–100.0)
Platelets: 260 10*3/uL (ref 150–400)
Platelets: 274 10*3/uL (ref 150–400)
RBC: 4.3 MIL/uL (ref 3.87–5.11)
RBC: 4.4 MIL/uL (ref 3.87–5.11)
RDW: 15.6 % — ABNORMAL HIGH (ref 11.5–15.5)
RDW: 15.8 % — ABNORMAL HIGH (ref 11.5–15.5)
WBC: 4.8 10*3/uL (ref 4.0–10.5)
WBC: 5 10*3/uL (ref 4.0–10.5)
WBC: 5.9 10*3/uL (ref 4.0–10.5)

## 2010-11-15 LAB — DIFFERENTIAL
Basophils Absolute: 0 10*3/uL (ref 0.0–0.1)
Eosinophils Relative: 8 % — ABNORMAL HIGH (ref 0–5)
Lymphocytes Relative: 16 % (ref 12–46)
Lymphocytes Relative: 27 % (ref 12–46)
Lymphs Abs: 0.8 10*3/uL (ref 0.7–4.0)
Monocytes Absolute: 0.5 10*3/uL (ref 0.1–1.0)
Monocytes Relative: 15 % — ABNORMAL HIGH (ref 3–12)
Neutro Abs: 2.5 10*3/uL (ref 1.7–7.7)
Neutrophils Relative %: 61 % (ref 43–77)

## 2010-11-15 LAB — BRAIN NATRIURETIC PEPTIDE
Pro B Natriuretic peptide (BNP): 108 pg/mL — ABNORMAL HIGH (ref 0.0–100.0)
Pro B Natriuretic peptide (BNP): 110 pg/mL — ABNORMAL HIGH (ref 0.0–100.0)
Pro B Natriuretic peptide (BNP): 155 pg/mL — ABNORMAL HIGH (ref 0.0–100.0)

## 2010-11-15 LAB — PROTIME-INR
INR: 1.13 (ref 0.00–1.49)
Prothrombin Time: 14.4 seconds (ref 11.6–15.2)

## 2010-11-15 LAB — APTT: aPTT: 37 seconds (ref 24–37)

## 2010-11-15 LAB — MAGNESIUM: Magnesium: 2.2 mg/dL (ref 1.5–2.5)

## 2010-11-16 LAB — BASIC METABOLIC PANEL
BUN: 10 mg/dL (ref 6–23)
BUN: 13 mg/dL (ref 6–23)
BUN: 15 mg/dL (ref 6–23)
CO2: 26 mEq/L (ref 19–32)
CO2: 27 mEq/L (ref 19–32)
Calcium: 8 mg/dL — ABNORMAL LOW (ref 8.4–10.5)
Calcium: 8.4 mg/dL (ref 8.4–10.5)
Calcium: 8.4 mg/dL (ref 8.4–10.5)
Chloride: 101 mEq/L (ref 96–112)
Chloride: 95 mEq/L — ABNORMAL LOW (ref 96–112)
Chloride: 96 mEq/L (ref 96–112)
Creatinine, Ser: 0.45 mg/dL (ref 0.4–1.2)
Creatinine, Ser: 0.56 mg/dL (ref 0.4–1.2)
Creatinine, Ser: 0.63 mg/dL (ref 0.4–1.2)
GFR calc Af Amer: >60 mL/min (ref >60)
GFR calc Af Amer: >60 mL/min (ref >60)
GFR calc Af Amer: >60 mL/min (ref >60)
GFR calc non Af Amer: >60 mL/min (ref >60)
GFR calc non Af Amer: >60 mL/min (ref >60)
GFR calc non Af Amer: >60 mL/min (ref >60)
Glucose, Bld: 80 mg/dL (ref 70–99)
Glucose, Bld: 90 mg/dL (ref 70–99)
Glucose, Bld: 99 mg/dL (ref 70–99)
Potassium: 3.6 mEq/L (ref 3.5–5.1)
Potassium: 3.7 mEq/L (ref 3.5–5.1)
Potassium: 3.8 mEq/L (ref 3.5–5.1)
Sodium: 126 mEq/L — ABNORMAL LOW (ref 135–145)
Sodium: 127 mEq/L — ABNORMAL LOW (ref 135–145)

## 2010-11-16 LAB — HEPATIC FUNCTION PANEL
ALT: 26 U/L (ref 0–35)
Albumin: 2.6 g/dL — ABNORMAL LOW (ref 3.5–5.2)
Alkaline Phosphatase: 189 U/L — ABNORMAL HIGH (ref 39–117)
Indirect Bilirubin: 1 mg/dL — ABNORMAL HIGH (ref 0.3–0.9)
Total Protein: 5.6 g/dL — ABNORMAL LOW (ref 6.0–8.3)

## 2010-11-16 LAB — CBC
Platelets: 233 10*3/uL (ref 150–400)
RDW: 13.4 % (ref 11.5–15.5)
WBC: 6.1 10*3/uL (ref 4.0–10.5)

## 2010-11-16 LAB — CARDIAC PANEL(CRET KIN+CKTOT+MB+TROPI)
CK, MB: 7.4 ng/mL (ref 0.3–4.0)
Relative Index: 2 (ref 0.0–2.5)
Total CK: 366 U/L — ABNORMAL HIGH (ref 7–177)

## 2010-11-16 LAB — TSH: TSH: 4.776 u[IU]/mL — ABNORMAL HIGH (ref 0.350–4.500)

## 2010-11-16 LAB — CORTISOL-AM, BLOOD: Cortisol - AM: 16 ug/dL (ref 4.3–22.4)

## 2010-11-16 LAB — MAGNESIUM: Magnesium: 1.8 mg/dL (ref 1.5–2.5)

## 2010-11-17 LAB — BASIC METABOLIC PANEL
BUN: 6 mg/dL (ref 6–23)
BUN: 6 mg/dL (ref 6–23)
BUN: 9 mg/dL (ref 6–23)
CO2: 25 mEq/L (ref 19–32)
CO2: 26 mEq/L (ref 19–32)
CO2: 27 mEq/L (ref 19–32)
CO2: 27 mEq/L (ref 19–32)
Calcium: 8.1 mg/dL — ABNORMAL LOW (ref 8.4–10.5)
Calcium: 8.3 mg/dL — ABNORMAL LOW (ref 8.4–10.5)
Calcium: 8.3 mg/dL — ABNORMAL LOW (ref 8.4–10.5)
Chloride: 90 mEq/L — ABNORMAL LOW (ref 96–112)
Chloride: 91 mEq/L — ABNORMAL LOW (ref 96–112)
Chloride: 95 mEq/L — ABNORMAL LOW (ref 96–112)
Creatinine, Ser: 0.37 mg/dL — ABNORMAL LOW (ref 0.4–1.2)
Creatinine, Ser: 0.44 mg/dL (ref 0.4–1.2)
Creatinine, Ser: 0.47 mg/dL (ref 0.4–1.2)
GFR calc Af Amer: >60 mL/min (ref >60)
GFR calc Af Amer: >60 mL/min (ref >60)
GFR calc Af Amer: >60 mL/min (ref >60)
GFR calc non Af Amer: >60 mL/min (ref >60)
Glucose, Bld: 126 mg/dL — ABNORMAL HIGH (ref 70–99)
Glucose, Bld: 130 mg/dL — ABNORMAL HIGH (ref 70–99)
Glucose, Bld: 88 mg/dL (ref 70–99)
Glucose, Bld: 96 mg/dL (ref 70–99)
Potassium: 3.7 mEq/L (ref 3.5–5.1)
Potassium: 3.8 mEq/L (ref 3.5–5.1)
Sodium: 123 mEq/L — ABNORMAL LOW (ref 135–145)
Sodium: 128 mEq/L — ABNORMAL LOW (ref 135–145)

## 2010-11-17 LAB — CARDIAC PANEL(CRET KIN+CKTOT+MB+TROPI)
CK, MB: 10.7 ng/mL (ref 0.3–4.0)
Relative Index: 2.9 — ABNORMAL HIGH (ref 0.0–2.5)
Relative Index: 3.5 — ABNORMAL HIGH (ref 0.0–2.5)
Total CK: 303 U/L — ABNORMAL HIGH (ref 7–177)
Total CK: 448 U/L — ABNORMAL HIGH (ref 7–177)
Troponin I: 0.02 ng/mL (ref 0.00–0.06)
Troponin I: 0.04 ng/mL (ref 0.00–0.06)

## 2010-11-17 LAB — COMPREHENSIVE METABOLIC PANEL
ALT: 20 U/L (ref 0–35)
AST: 31 U/L (ref 0–37)
Albumin: 3.5 g/dL (ref 3.5–5.2)
CO2: 24 mEq/L (ref 19–32)
Calcium: 8.7 mg/dL (ref 8.4–10.5)
Creatinine, Ser: 0.48 mg/dL (ref 0.4–1.2)
GFR calc Af Amer: >60 mL/min (ref >60)
GFR calc non Af Amer: >60 mL/min (ref >60)
Sodium: 123 mEq/L — ABNORMAL LOW (ref 135–145)
Total Protein: 6.6 g/dL (ref 6.0–8.3)

## 2010-11-17 LAB — CREATININE, URINE, RANDOM: Creatinine, Urine: 32.2 mg/dL

## 2010-11-17 LAB — CK TOTAL AND CKMB (NOT AT ARMC)
CK, MB: 7.7 ng/mL (ref 0.3–4.0)
Relative Index: 3.6 — ABNORMAL HIGH (ref 0.0–2.5)

## 2010-11-17 LAB — BRAIN NATRIURETIC PEPTIDE
Pro B Natriuretic peptide (BNP): 267 pg/mL — ABNORMAL HIGH (ref 0.0–100.0)
Pro B Natriuretic peptide (BNP): 406 pg/mL — ABNORMAL HIGH (ref 0.0–100.0)

## 2010-11-17 LAB — LIPID PANEL
Cholesterol: 151 mg/dL (ref 0–200)
HDL: 66 mg/dL (ref >39)
LDL Cholesterol: 76 mg/dL (ref 0–99)

## 2010-11-17 LAB — CBC
MCHC: 33.7 g/dL (ref 30.0–36.0)
MCV: 83.5 fL (ref 78.0–100.0)
Platelets: 288 10*3/uL (ref 150–400)
RDW: 13.9 % (ref 11.5–15.5)

## 2010-11-17 LAB — POCT CARDIAC MARKERS: Troponin i, poc: <0.05 ng/mL (ref 0.00–0.09)

## 2010-11-17 LAB — DIFFERENTIAL
Eosinophils Relative: 2 % (ref 0–5)
Lymphocytes Relative: 24 % (ref 12–46)
Lymphs Abs: 1.5 10*3/uL (ref 0.7–4.0)
Monocytes Relative: 8 % (ref 3–12)

## 2010-11-17 LAB — HEMOGLOBIN A1C: Hgb A1c MFr Bld: 5.4 % (ref <5.7)

## 2010-11-17 LAB — PHOSPHORUS: Phosphorus: 3.1 mg/dL (ref 2.3–4.6)

## 2010-11-17 LAB — APTT: aPTT: 35 seconds (ref 24–37)

## 2010-11-17 LAB — SODIUM, URINE, RANDOM: Sodium, Ur: 95 mEq/L

## 2010-11-30 LAB — POCT I-STAT, CHEM 8
BUN: 11 mg/dL (ref 6–23)
Calcium, Ion: 1.04 mmol/L — ABNORMAL LOW (ref 1.12–1.32)
Chloride: 104 mEq/L (ref 96–112)
Creatinine, Ser: 0.7 mg/dL (ref 0.4–1.2)
Glucose, Bld: 94 mg/dL (ref 70–99)

## 2010-11-30 LAB — CBC
HCT: 33.6 % — ABNORMAL LOW (ref 36.0–46.0)
HCT: 34.1 % — ABNORMAL LOW (ref 36.0–46.0)
HCT: 35.7 % — ABNORMAL LOW (ref 36.0–46.0)
HCT: 37.4 % (ref 36.0–46.0)
HCT: 37.7 % (ref 36.0–46.0)
Hemoglobin: 11.3 g/dL — ABNORMAL LOW (ref 12.0–15.0)
Hemoglobin: 12.3 g/dL (ref 12.0–15.0)
MCHC: 33 g/dL (ref 30.0–36.0)
MCHC: 33.3 g/dL (ref 30.0–36.0)
MCHC: 33.6 g/dL (ref 30.0–36.0)
MCV: 78.9 fL (ref 78.0–100.0)
MCV: 79 fL (ref 78.0–100.0)
MCV: 79.2 fL (ref 78.0–100.0)
MCV: 79.2 fL (ref 78.0–100.0)
MCV: 79.3 fL (ref 78.0–100.0)
Platelets: 257 10*3/uL (ref 150–400)
Platelets: 261 10*3/uL (ref 150–400)
Platelets: 268 10*3/uL (ref 150–400)
Platelets: 286 10*3/uL (ref 150–400)
Platelets: 296 10*3/uL (ref 150–400)
RBC: 4.21 MIL/uL (ref 3.87–5.11)
RBC: 4.26 MIL/uL (ref 3.87–5.11)
RBC: 4.76 MIL/uL (ref 3.87–5.11)
RDW: 15 % (ref 11.5–15.5)
RDW: 15.1 % (ref 11.5–15.5)
WBC: 4.9 10*3/uL (ref 4.0–10.5)
WBC: 7.2 10*3/uL (ref 4.0–10.5)
WBC: 7.2 10*3/uL (ref 4.0–10.5)

## 2010-11-30 LAB — LIPID PANEL
HDL: 63 mg/dL (ref >39)
HDL: 65 mg/dL (ref >39)
LDL Cholesterol: 82 mg/dL (ref 0–99)
Total CHOL/HDL Ratio: 2.4 RATIO
Triglycerides: 46 mg/dL (ref <150)
Triglycerides: 51 mg/dL (ref <150)
VLDL: 9 mg/dL (ref 0–40)

## 2010-11-30 LAB — BASIC METABOLIC PANEL
BUN: 10 mg/dL (ref 6–23)
BUN: 11 mg/dL (ref 6–23)
BUN: 11 mg/dL (ref 6–23)
BUN: 12 mg/dL (ref 6–23)
BUN: 5 mg/dL — ABNORMAL LOW (ref 6–23)
CO2: 25 mEq/L (ref 19–32)
Calcium: 8.2 mg/dL — ABNORMAL LOW (ref 8.4–10.5)
Chloride: 100 mEq/L (ref 96–112)
Chloride: 100 mEq/L (ref 96–112)
Chloride: 103 mEq/L (ref 96–112)
Chloride: 104 mEq/L (ref 96–112)
Creatinine, Ser: 0.65 mg/dL (ref 0.4–1.2)
Creatinine, Ser: 0.75 mg/dL (ref 0.4–1.2)
Creatinine, Ser: 0.86 mg/dL (ref 0.4–1.2)
GFR calc Af Amer: >60 mL/min (ref >60)
GFR calc Af Amer: >60 mL/min (ref >60)
GFR calc Af Amer: >60 mL/min (ref >60)
GFR calc non Af Amer: >60 mL/min (ref >60)
GFR calc non Af Amer: >60 mL/min (ref >60)
Glucose, Bld: 88 mg/dL (ref 70–99)
Glucose, Bld: 94 mg/dL (ref 70–99)
Potassium: 3.5 mEq/L (ref 3.5–5.1)
Potassium: 3.5 mEq/L (ref 3.5–5.1)
Sodium: 135 mEq/L (ref 135–145)

## 2010-11-30 LAB — DIFFERENTIAL
Basophils Absolute: 0.1 10*3/uL (ref 0.0–0.1)
Basophils Relative: 0 % (ref 0–1)
Eosinophils Absolute: 0.2 10*3/uL (ref 0.0–0.7)
Eosinophils Absolute: 0.6 10*3/uL (ref 0.0–0.7)
Eosinophils Relative: 12 % — ABNORMAL HIGH (ref 0–5)
Eosinophils Relative: 9 % — ABNORMAL HIGH (ref 0–5)
Lymphocytes Relative: 16 % (ref 12–46)
Lymphocytes Relative: 23 % (ref 12–46)
Lymphs Abs: 0.8 10*3/uL (ref 0.7–4.0)
Lymphs Abs: 2.4 10*3/uL (ref 0.7–4.0)
Monocytes Absolute: 0.5 10*3/uL (ref 0.1–1.0)
Monocytes Absolute: 0.7 10*3/uL (ref 0.1–1.0)
Monocytes Relative: 10 % (ref 3–12)
Monocytes Relative: 12 % (ref 3–12)
Neutro Abs: 4.6 10*3/uL (ref 1.7–7.7)
Neutrophils Relative %: 58 % (ref 43–77)

## 2010-11-30 LAB — PHOSPHORUS
Phosphorus: 3 mg/dL (ref 2.3–4.6)
Phosphorus: 3.1 mg/dL (ref 2.3–4.6)

## 2010-11-30 LAB — URINALYSIS, ROUTINE W REFLEX MICROSCOPIC
Bilirubin Urine: NEGATIVE
Glucose, UA: NEGATIVE mg/dL
Hgb urine dipstick: NEGATIVE
Ketones, ur: NEGATIVE mg/dL
pH: 8 (ref 5.0–8.0)

## 2010-11-30 LAB — POCT CARDIAC MARKERS
Myoglobin, poc: 90.7 ng/mL (ref 12–200)
Troponin i, poc: <0.05 ng/mL (ref 0.00–0.09)

## 2010-11-30 LAB — WOUND CULTURE
Gram Stain: NONE SEEN
Gram Stain: NONE SEEN

## 2010-11-30 LAB — BRAIN NATRIURETIC PEPTIDE: Pro B Natriuretic peptide (BNP): 77 pg/mL (ref 0.0–100.0)

## 2010-11-30 LAB — MAGNESIUM
Magnesium: 1.6 mg/dL (ref 1.5–2.5)
Magnesium: 1.6 mg/dL (ref 1.5–2.5)

## 2010-11-30 LAB — TROPONIN I
Troponin I: 0.01 ng/mL (ref 0.00–0.06)
Troponin I: 0.03 ng/mL (ref 0.00–0.06)

## 2010-11-30 LAB — HEPARIN LEVEL (UNFRACTIONATED): Heparin Unfractionated: 0.31 IU/mL (ref 0.30–0.70)

## 2010-11-30 LAB — CARDIAC PANEL(CRET KIN+CKTOT+MB+TROPI)
CK, MB: 15.7 ng/mL — ABNORMAL HIGH (ref 0.3–4.0)
Relative Index: 4.3 — ABNORMAL HIGH (ref 0.0–2.5)
Relative Index: 4.6 — ABNORMAL HIGH (ref 0.0–2.5)
Total CK: 408 U/L — ABNORMAL HIGH (ref 7–177)
Troponin I: 0.02 ng/mL (ref 0.00–0.06)
Troponin I: 0.02 ng/mL (ref 0.00–0.06)
Troponin I: 0.06 ng/mL (ref 0.00–0.06)

## 2010-12-01 LAB — BASIC METABOLIC PANEL
CO2: 28 mEq/L (ref 19–32)
Calcium: 8.6 mg/dL (ref 8.4–10.5)
GFR calc Af Amer: >60 mL/min (ref >60)
GFR calc non Af Amer: >60 mL/min (ref >60)
Potassium: 4 mEq/L (ref 3.5–5.1)
Sodium: 136 mEq/L (ref 135–145)

## 2010-12-01 LAB — URINE MICROSCOPIC-ADD ON

## 2010-12-01 LAB — URINALYSIS, ROUTINE W REFLEX MICROSCOPIC
Bilirubin Urine: NEGATIVE
Glucose, UA: NEGATIVE mg/dL
Hgb urine dipstick: NEGATIVE
Specific Gravity, Urine: 1.019 (ref 1.005–1.030)
pH: 6.5 (ref 5.0–8.0)

## 2010-12-01 LAB — CBC
HCT: 34.2 % — ABNORMAL LOW (ref 36.0–46.0)
Hemoglobin: 10.9 g/dL — ABNORMAL LOW (ref 12.0–15.0)
RBC: 4.24 MIL/uL (ref 3.87–5.11)

## 2010-12-01 LAB — DIFFERENTIAL
Eosinophils Relative: 6 % — ABNORMAL HIGH (ref 0–5)
Lymphocytes Relative: 30 % (ref 12–46)
Monocytes Absolute: 0.7 10*3/uL (ref 0.1–1.0)
Monocytes Relative: 8 % (ref 3–12)
Neutro Abs: 4.6 10*3/uL (ref 1.7–7.7)

## 2010-12-04 ENCOUNTER — Emergency Department (HOSPITAL_COMMUNITY)
Admission: EM | Admit: 2010-12-04 | Discharge: 2010-12-04 | Disposition: A | Discharge status: Home / Self Care | Payer: Medicare Other | Attending: Emergency Medicine | Admitting: Emergency Medicine

## 2010-12-04 DIAGNOSIS — Y92009 Unspecified place in unspecified non-institutional (private) residence as the place of occurrence of the external cause: Secondary | ICD-10-CM | POA: Insufficient documentation

## 2010-12-04 DIAGNOSIS — W010XXA Fall on same level from slipping, tripping and stumbling without subsequent striking against object, initial encounter: Secondary | ICD-10-CM | POA: Insufficient documentation

## 2010-12-04 DIAGNOSIS — Z950 Presence of cardiac pacemaker: Secondary | ICD-10-CM | POA: Insufficient documentation

## 2010-12-04 DIAGNOSIS — I10 Essential (primary) hypertension: Secondary | ICD-10-CM | POA: Insufficient documentation

## 2010-12-04 DIAGNOSIS — S81009A Unspecified open wound, unspecified knee, initial encounter: Secondary | ICD-10-CM | POA: Insufficient documentation

## 2010-12-04 DIAGNOSIS — I509 Heart failure, unspecified: Secondary | ICD-10-CM | POA: Insufficient documentation

## 2010-12-04 LAB — LACTIC ACID, PLASMA: Lactic Acid, Venous: 2.3 mmol/L — ABNORMAL HIGH (ref 0.5–2.2)

## 2010-12-04 LAB — T4, FREE: Free T4: 1.16 ng/dL (ref 0.80–1.80)

## 2010-12-04 LAB — CBC
HCT: 28.2 % — ABNORMAL LOW (ref 36.0–46.0)
HCT: 29.3 % — ABNORMAL LOW (ref 36.0–46.0)
HCT: 30.2 % — ABNORMAL LOW (ref 36.0–46.0)
HCT: 30.9 % — ABNORMAL LOW (ref 36.0–46.0)
HCT: 31 % — ABNORMAL LOW (ref 36.0–46.0)
Hemoglobin: 10.1 g/dL — ABNORMAL LOW (ref 12.0–15.0)
Hemoglobin: 10.3 g/dL — ABNORMAL LOW (ref 12.0–15.0)
Hemoglobin: 10.6 g/dL — ABNORMAL LOW (ref 12.0–15.0)
Hemoglobin: 11 g/dL — ABNORMAL LOW (ref 12.0–15.0)
MCHC: 32.7 g/dL (ref 30.0–36.0)
MCHC: 33.3 g/dL (ref 30.0–36.0)
MCHC: 33.4 g/dL (ref 30.0–36.0)
MCHC: 33.8 g/dL (ref 30.0–36.0)
MCV: 85.3 fL (ref 78.0–100.0)
MCV: 85.5 fL (ref 78.0–100.0)
MCV: 85.9 fL (ref 78.0–100.0)
MCV: 87.5 fL (ref 78.0–100.0)
Platelets: 188 10*3/uL (ref 150–400)
Platelets: 196 10*3/uL (ref 150–400)
Platelets: 207 10*3/uL (ref 150–400)
RBC: 3.65 MIL/uL — ABNORMAL LOW (ref 3.87–5.11)
RBC: 3.84 MIL/uL — ABNORMAL LOW (ref 3.87–5.11)
RBC: 4.36 MIL/uL (ref 3.87–5.11)
RDW: 13.4 % (ref 11.5–15.5)
RDW: 14 % (ref 11.5–15.5)
RDW: 14.1 % (ref 11.5–15.5)
RDW: 14.2 % (ref 11.5–15.5)
WBC: 4.8 10*3/uL (ref 4.0–10.5)
WBC: 5.2 10*3/uL (ref 4.0–10.5)
WBC: 5.9 10*3/uL (ref 4.0–10.5)
WBC: 7.9 10*3/uL (ref 4.0–10.5)
WBC: 9.1 10*3/uL (ref 4.0–10.5)
WBC: 9.4 10*3/uL (ref 4.0–10.5)

## 2010-12-04 LAB — BASIC METABOLIC PANEL
BUN: 6 mg/dL (ref 6–23)
BUN: 7 mg/dL (ref 6–23)
BUN: 8 mg/dL (ref 6–23)
CO2: 23 mEq/L (ref 19–32)
CO2: 25 mEq/L (ref 19–32)
Chloride: 105 mEq/L (ref 96–112)
Chloride: 105 mEq/L (ref 96–112)
Chloride: 106 mEq/L (ref 96–112)
Chloride: 106 mEq/L (ref 96–112)
Creatinine, Ser: 0.47 mg/dL (ref 0.4–1.2)
GFR calc non Af Amer: >60 mL/min (ref >60)
GFR calc non Af Amer: >60 mL/min (ref >60)
Glucose, Bld: 134 mg/dL — ABNORMAL HIGH (ref 70–99)
Glucose, Bld: 139 mg/dL — ABNORMAL HIGH (ref 70–99)
Glucose, Bld: 93 mg/dL (ref 70–99)
Potassium: 3.6 mEq/L (ref 3.5–5.1)
Potassium: 3.7 mEq/L (ref 3.5–5.1)
Potassium: 3.9 mEq/L (ref 3.5–5.1)
Potassium: 4 mEq/L (ref 3.5–5.1)
Sodium: 135 mEq/L (ref 135–145)
Sodium: 137 mEq/L (ref 135–145)
Sodium: 137 mEq/L (ref 135–145)

## 2010-12-04 LAB — COMPREHENSIVE METABOLIC PANEL
ALT: 13 U/L (ref 0–35)
Calcium: 8.2 mg/dL — ABNORMAL LOW (ref 8.4–10.5)
Creatinine, Ser: 0.66 mg/dL (ref 0.4–1.2)
GFR calc Af Amer: >60 mL/min (ref >60)
Glucose, Bld: 205 mg/dL — ABNORMAL HIGH (ref 70–99)
Sodium: 137 mEq/L (ref 135–145)
Total Protein: 5.5 g/dL — ABNORMAL LOW (ref 6.0–8.3)

## 2010-12-04 LAB — CROSSMATCH: ABO/RH(D): O POS

## 2010-12-04 LAB — POCT CARDIAC MARKERS: CKMB, poc: <1 ng/mL — ABNORMAL LOW (ref 1.0–8.0)

## 2010-12-04 LAB — URINALYSIS, ROUTINE W REFLEX MICROSCOPIC
Glucose, UA: NEGATIVE mg/dL
Hgb urine dipstick: NEGATIVE
Ketones, ur: 15 mg/dL — AB
Protein, ur: NEGATIVE mg/dL

## 2010-12-04 LAB — POCT I-STAT, CHEM 8
BUN: 14 mg/dL (ref 6–23)
Calcium, Ion: 1.03 mmol/L — ABNORMAL LOW (ref 1.12–1.32)
Chloride: 104 mEq/L (ref 96–112)
HCT: 38 % (ref 36.0–46.0)
Sodium: 138 mEq/L (ref 135–145)
TCO2: 26 mmol/L (ref 0–100)

## 2010-12-04 LAB — PREPARE PLATELETS

## 2010-12-04 LAB — DIFFERENTIAL
Basophils Relative: 0 % (ref 0–1)
Eosinophils Absolute: 0.1 10*3/uL (ref 0.0–0.7)
Eosinophils Relative: 1 % (ref 0–5)
Lymphocytes Relative: 8 % — ABNORMAL LOW (ref 12–46)
Lymphs Abs: 0.4 10*3/uL — ABNORMAL LOW (ref 0.7–4.0)
Lymphs Abs: 1.6 10*3/uL (ref 0.7–4.0)
Monocytes Absolute: 0 10*3/uL — ABNORMAL LOW (ref 0.1–1.0)
Monocytes Absolute: 0.7 10*3/uL (ref 0.1–1.0)
Monocytes Relative: 1 % — ABNORMAL LOW (ref 3–12)
Monocytes Relative: 8 % (ref 3–12)
Neutro Abs: 4.7 10*3/uL (ref 1.7–7.7)
Neutrophils Relative %: 91 % — ABNORMAL HIGH (ref 43–77)

## 2010-12-04 LAB — URINE CULTURE
Colony Count: NO GROWTH
Culture: NO GROWTH

## 2010-12-04 LAB — CK TOTAL AND CKMB (NOT AT ARMC)
CK, MB: 2.1 ng/mL (ref 0.3–4.0)
Total CK: 57 U/L (ref 7–177)

## 2010-12-04 LAB — CARDIAC PANEL(CRET KIN+CKTOT+MB+TROPI)
CK, MB: 10.7 ng/mL — ABNORMAL HIGH (ref 0.3–4.0)
CK, MB: 30.4 ng/mL — ABNORMAL HIGH (ref 0.3–4.0)
CK, MB: 51 ng/mL — ABNORMAL HIGH (ref 0.3–4.0)
Relative Index: 6.9 — ABNORMAL HIGH (ref 0.0–2.5)
Relative Index: 7.4 — ABNORMAL HIGH (ref 0.0–2.5)
Total CK: 165 U/L (ref 7–177)
Troponin I: 0.02 ng/mL (ref 0.00–0.06)

## 2010-12-04 LAB — APTT: aPTT: 27 seconds (ref 24–37)

## 2010-12-04 LAB — ABO/RH: ABO/RH(D): O POS

## 2010-12-04 LAB — PROTIME-INR
INR: 1 (ref 0.00–1.49)
Prothrombin Time: 13.1 seconds (ref 11.6–15.2)

## 2011-02-01 ENCOUNTER — Encounter: Payer: Self-pay | Admitting: Internal Medicine

## 2011-02-19 ENCOUNTER — Encounter: Payer: Self-pay | Admitting: *Deleted

## 2011-02-19 ENCOUNTER — Encounter: Payer: Self-pay | Admitting: Internal Medicine

## 2011-02-22 ENCOUNTER — Ambulatory Visit (INDEPENDENT_AMBULATORY_CARE_PROVIDER_SITE_OTHER): Payer: Medicare Other | Admitting: Internal Medicine

## 2011-02-22 ENCOUNTER — Encounter: Payer: Self-pay | Admitting: Internal Medicine

## 2011-02-22 DIAGNOSIS — I4891 Unspecified atrial fibrillation: Secondary | ICD-10-CM

## 2011-02-22 DIAGNOSIS — R609 Edema, unspecified: Secondary | ICD-10-CM | POA: Insufficient documentation

## 2011-02-22 DIAGNOSIS — I495 Sick sinus syndrome: Secondary | ICD-10-CM

## 2011-02-22 DIAGNOSIS — I10 Essential (primary) hypertension: Secondary | ICD-10-CM

## 2011-02-22 NOTE — Assessment & Plan Note (Signed)
Presently maintaining sinus rhythm and rate controlled She is not a candidate for coumadin or even ASA at this time due to recent GI bleeding.  Check CBC today given recent GI bleed. Check digoxin level today.

## 2011-02-22 NOTE — Progress Notes (Signed)
Andrea Koch is a pleasant 77 y.o. yo patient with a h/o paroxysmal atrial fibrillation and tachycardia/ bradycardia syndrome sp PPM (MDT) by Dr Andrea Koch  who presents today to establish care in the Electrophysiology device clinic.   The patient is frail and chronically ill.  She has dementia and is frequently aggitated according to her son.  She developed acute GI bleed 2/12 for which she was found to have an ulcer due to ASA.  She is therefore felt to not be a candidate for anticoagulation.  She has had no further syncope since her pacemaker.  She reports occasional CP at night as well as infrequent SOB.  Her son feels that she is doing reasonably well.  She has stable edema.   Today, she  denies symptoms of palpitations, chest pain, orthopnea, PND, dizziness, presyncope, syncope, or neurologic sequela.  The patientis tolerating medications without difficulties and is otherwise without complaint today.   Past Medical History  Diagnosis Date  . Dementia   . HTN (hypertension)   . Atrial fibrillation     paroxysmal  . Tachycardia-bradycardia syndrome     s/p PPM by Dr Andrea Koch  . GI bleed 2/12    ulcer to ASA  . Diastolic dysfunction     Past Surgical History  Procedure Date  . Pacemaker insertion 09/02/09    by Dr Andrea Koch due to tachycardia bradycardia syndrome and syncope (MDT)    History   Social History  . Marital Status: Widowed    Spouse Name: N/A    Number of Children: N/A  . Years of Education: N/A   Occupational History  . Not on file.   Social History Main Topics  . Smoking status: Never Smoker   . Smokeless tobacco: Not on file  . Alcohol Use: No  . Drug Use: No  . Sexually Active: Not on file   Other Topics Concern  . Not on file   Social History Narrative  . No narrative on file    No family history on file.  Allergies  Allergen Reactions  . Sulfonamide Derivatives     Current Outpatient Prescriptions  Medication Sig Dispense Refill  . clonazePAM  (KLONOPIN) 0.5 MG tablet 1/2 to 1 tab po prn       . digoxin (LANOXIN) 0.125 MG tablet Take 125 mcg by mouth daily.        . furosemide (LASIX) 20 MG tablet Take 20 mg by mouth 2 (two) times daily.        . metoprolol tartrate (LOPRESSOR) 25 MG tablet 1/2 tab po qd if lower then 110 hold medication      . DISCONTD: amoxicillin (AMOXIL) 500 MG capsule Take 1,000 mg by mouth 2 (two) times daily. Take for 14 days.       Marland Kitchen DISCONTD: aspirin (ASPIR-LOW) 81 MG EC tablet Take 81 mg by mouth daily.        Marland Kitchen DISCONTD: clarithromycin (BIAXIN) 500 MG tablet Take 500 mg by mouth 2 (two) times daily. Take for 2 weeks.         ROS- pt unable to provide due to dementia  Physical Exam: Filed Vitals:   02/22/11 1601  BP: 153/73  Pulse: 86  Resp: 16  Height: 5\' 7"  (1.702 m)  Weight: 120 lb (54.432 kg)    GEN- The patient is thin, frail, and ill appearing, alert but confused.   Head- normocephalic, atraumatic, alopecia Eyes-  Sclera clear, conjunctiva pink Ears- hearing intact Oropharynx- clear Neck- supple,  no JVP Lymph- no cervical lymphadenopathy Lungs- Clear to ausculation bilaterally, normal work of breathing Chest- pacemaker pocket is well healed Heart- Regular rate and rhythm, no murmurs, rubs or gallops, PMI not laterally displaced GI- soft, NT, ND, + BS Extremities- no clubbing, cyanosis, 2+ pitting edema MS-  Diffuse muscle atrophy, ambulating by wheelchair Skin- diffuse severe ecchymosis over arms Psych- pleasantly demented mood, full affect Neuro- strength and sensation are intact  Pacemaker interrogation- reviewed in detail today,  See PACEART report  Assessment and Plan:

## 2011-02-22 NOTE — Assessment & Plan Note (Signed)
Normal pacemaker function See Pace Art report No changes today  

## 2011-02-22 NOTE — Assessment & Plan Note (Signed)
Preserved EF Due to venous insufficiency She declines support hose Salt restriction advised  Caution with lasix to avoid intravascular dehydration bmet today

## 2011-02-22 NOTE — Patient Instructions (Signed)
Your physician wants you to follow-up in: 6 months in the device clinic  You will receive a reminder letter in the mail two months in advance. If you don't receive a letter, please call our office to schedule the follow-up appointment.   Lab work today---BMP/CBC/DIG Level

## 2011-02-23 LAB — CBC WITH DIFFERENTIAL/PLATELET
Basophils Absolute: 0.1 10*3/uL (ref 0.0–0.1)
Basophils Relative: 1.1 % (ref 0.0–3.0)
Eosinophils Absolute: 0.1 10*3/uL (ref 0.0–0.7)
Lymphocytes Relative: 18.6 % (ref 12.0–46.0)
MCHC: 33.3 g/dL (ref 30.0–36.0)
Monocytes Relative: 7.7 % (ref 3.0–12.0)
Neutrophils Relative %: 70.3 % (ref 43.0–77.0)
RBC: 4 Mil/uL (ref 3.87–5.11)

## 2011-02-23 LAB — BASIC METABOLIC PANEL
CO2: 28 mEq/L (ref 19–32)
Calcium: 8.6 mg/dL (ref 8.4–10.5)
Creatinine, Ser: 0.6 mg/dL (ref 0.4–1.2)
GFR: 113.85 mL/min (ref >60.00)
Sodium: 130 mEq/L — ABNORMAL LOW (ref 135–145)

## 2011-03-30 ENCOUNTER — Other Ambulatory Visit: Payer: Self-pay | Admitting: *Deleted

## 2011-03-30 MED ORDER — DIGOXIN 125 MCG PO TABS: 125.0000 ug | ORAL_TABLET | Freq: Every day | ORAL | Status: DC

## 2011-08-05 ENCOUNTER — Emergency Department (HOSPITAL_COMMUNITY)
Admission: EM | Admit: 2011-08-05 | Discharge: 2011-08-05 | Disposition: A | Discharge status: Home / Self Care | Payer: Medicare Other | Attending: Emergency Medicine | Admitting: Emergency Medicine

## 2011-08-05 ENCOUNTER — Emergency Department (HOSPITAL_COMMUNITY): Payer: Medicare Other

## 2011-08-05 ENCOUNTER — Encounter (HOSPITAL_COMMUNITY): Payer: Self-pay | Admitting: Emergency Medicine

## 2011-08-05 DIAGNOSIS — R231 Pallor: Secondary | ICD-10-CM | POA: Insufficient documentation

## 2011-08-05 DIAGNOSIS — W19XXXA Unspecified fall, initial encounter: Secondary | ICD-10-CM

## 2011-08-05 DIAGNOSIS — I10 Essential (primary) hypertension: Secondary | ICD-10-CM | POA: Insufficient documentation

## 2011-08-05 DIAGNOSIS — F0390 Unspecified dementia without behavioral disturbance: Secondary | ICD-10-CM | POA: Insufficient documentation

## 2011-08-05 DIAGNOSIS — W1809XA Striking against other object with subsequent fall, initial encounter: Secondary | ICD-10-CM | POA: Insufficient documentation

## 2011-08-05 DIAGNOSIS — S0180XA Unspecified open wound of other part of head, initial encounter: Secondary | ICD-10-CM | POA: Insufficient documentation

## 2011-08-05 DIAGNOSIS — S1093XA Contusion of unspecified part of neck, initial encounter: Secondary | ICD-10-CM | POA: Insufficient documentation

## 2011-08-05 DIAGNOSIS — M542 Cervicalgia: Secondary | ICD-10-CM | POA: Insufficient documentation

## 2011-08-05 DIAGNOSIS — S0003XA Contusion of scalp, initial encounter: Secondary | ICD-10-CM | POA: Insufficient documentation

## 2011-08-05 DIAGNOSIS — M4802 Spinal stenosis, cervical region: Secondary | ICD-10-CM | POA: Insufficient documentation

## 2011-08-05 DIAGNOSIS — S0181XA Laceration without foreign body of other part of head, initial encounter: Secondary | ICD-10-CM

## 2011-08-05 MED ORDER — TETANUS-DIPHTHERIA TOXOIDS TD 5-2 LFU IM INJ
0.5000 mL | INJECTION | Freq: Once | INTRAMUSCULAR | Status: AC
  Administered 2011-08-05: 0.5 mL via INTRAMUSCULAR
  Filled 2011-08-05: qty 0.5

## 2011-08-05 MED ORDER — LIDOCAINE-EPINEPHRINE (PF) 2 %-1:200000 IJ SOLN
20.0000 mL | Freq: Once | INTRAMUSCULAR | Status: DC
  Filled 2011-08-05: qty 20

## 2011-08-05 MED ORDER — BACITRACIN-NEOMYCIN-POLYMYXIN OINTMENT TUBE
TOPICAL_OINTMENT | Freq: Every day | CUTANEOUS | Status: DC
  Administered 2011-08-05: 07:00:00 via TOPICAL
  Filled 2011-08-05: qty 15

## 2011-08-05 NOTE — ED Notes (Signed)
Per EMS pt fell at home and has a laceration to her forehead  Bleeding controlled at this time  Pt was back in bed upon EMS arrival  Denies LOC   Pt lives at home with her son

## 2011-08-05 NOTE — ED Provider Notes (Signed)
History     CSN: [id number] Arrival date & time: 08/05/2011  2:18 AM   First MD Initiated Contact with Patient 08/05/11 0257      Chief Complaint  Patient presents with  . Fall    (Consider location/radiation/quality/duration/timing/severity/associated sxs/prior treatment) HPI Comments: Frail elderly, demented female got out of bed to use the bathroom, fell for unknown reason, striking her head on unknown object, now with laceration to right anterior for head.  Her son heard her fall and immediately went to the room.  There was no loss of consciousness.  No vomiting.  EMS called for transport  Patient is a 77 y.o. female presenting with fall. The history is provided by a relative and a caregiver. The history is limited by the condition of the patient.  Fall The accident occurred less than 1 hour ago. The fall occurred while walking. She fell from a height of 3 to 5 ft. She landed on a hard floor. The volume of blood lost was moderate. The point of impact was the head.    Past Medical History  Diagnosis Date  . Dementia   . HTN (hypertension)   . Atrial fibrillation     paroxysmal  . Tachycardia-bradycardia syndrome     s/p PPM by Dr Deborah Chalk  . GI bleed 2/12    ulcer to ASA  . Diastolic dysfunction     Past Surgical History  Procedure Date  . Pacemaker insertion 09/02/09    by Dr Deborah Chalk due to tachycardia bradycardia syndrome and syncope (MDT)    No family history on file.  History  Substance Use Topics  . Smoking status: Never Smoker   . Smokeless tobacco: Not on file  . Alcohol Use: No    OB History    Grav Para Term Preterm Abortions TAB SAB Ect Mult Living                  Review of Systems  Constitutional: Negative.   HENT: Positive for neck pain.   Eyes: Negative.   Respiratory: Negative.   Cardiovascular: Negative.   Gastrointestinal: Negative.   Genitourinary: Negative.   Skin: Positive for wound.  Neurological: Negative.   Hematological:  Negative.   Psychiatric/Behavioral: Negative.     Allergies  Sulfonamide derivatives  Home Medications   Current Outpatient Rx  Name Route Sig Dispense Refill  . CLONAZEPAM 0.5 MG PO TABS  1/2 to 1 tab po prn     . DIGOXIN 0.125 MG PO TABS Oral Take 1 tablet (125 mcg total) by mouth daily. 30 tablet 5  . METOPROLOL TARTRATE 25 MG PO TABS  1/2 tab po qd if lower then 110 hold medication    . FUROSEMIDE 20 MG PO TABS Oral Take 20 mg by mouth 2 (two) times daily.        BP 122/64  Pulse 62  Resp 18  SpO2 100%  Physical Exam  Constitutional: She appears well-developed. No distress.  HENT:  Head:         4 cm laceration in u shape  Neck: Spinous process tenderness present. Rigidity present. Decreased range of motion present.  Cardiovascular: Normal rate.   Pulmonary/Chest: Effort normal.  Abdominal: Soft.  Musculoskeletal: She exhibits no edema and no tenderness.       Bruises noted over both anterior knees, elbows, shins.  Pelvis stable, range of motion.  Knees, ankles, elbows, wrists, normal for patient, although diminished due to arthritis.  No obvious deformities  Skin: Skin  is warm. There is pallor.    ED Course  LACERATION REPAIR Date/Time: 08/05/2011 6:17 AM Performed by: Arman Filter Authorized by: Arman Filter Consent: Verbal consent obtained. Consent given by: guardian Site marked: the operative site was not marked Imaging studies: imaging studies available Time out: Immediately prior to procedure a "time out" was called to verify the correct patient, procedure, equipment, support staff and site/side marked as required. Body area: head/neck Location details: forehead Laceration length: 3 cm Foreign bodies: wood Tendon involvement: none Nerve involvement: none Vascular damage: no Anesthesia: local infiltration Local anesthetic: lidocaine 1% without epinephrine Patient sedated: no Irrigation solution: saline Skin closure: 6-0 Prolene Number of  sutures: 5 Technique: simple Approximation: close Approximation difficulty: simple Dressing: antibiotic ointment Patient tolerance: Patient tolerated the procedure well with no immediate complications.   (including critical care time)  Labs Reviewed - No data to display Ct Head Wo Contrast  08/05/2011  *RADIOLOGY REPORT*  Clinical Data:  Status post fall, with a scalp hematoma at the left forehead.  Concern for cervical spine injury.  CT HEAD WITHOUT CONTRAST AND CT CERVICAL SPINE WITHOUT CONTRAST  Technique:  Multidetector CT imaging of the head and cervical spine was performed following the standard protocol without intravenous contrast.  Multiplanar CT image reconstructions of the cervical spine were also generated.  Comparison: CT of the head performed 08/23/2009, and CT of the cervical spine performed 05/24/2009  CT HEAD  Findings: There is no evidence of acute infarction, mass lesion, or intra- or extra-axial hemorrhage on CT.  Prominence of the ventricles and sulci reflects moderate cortical volume loss.  Cerebellar atrophy is noted.  Diffuse periventricular and subcortical white matter change likely reflects small vessel ischemic microangiopathy.  The brainstem and fourth ventricle are within normal limits.  The basal ganglia are unremarkable in appearance.  The cerebral hemispheres demonstrate grossly normal gray-white differentiation. No mass effect or midline shift is seen.  There is no evidence of fracture; visualized osseous structures are unremarkable in appearance.  The visualized portions of the orbits are within normal limits.  The paranasal sinuses and mastoid air cells are well-aerated.  Focal scalp hematoma is noted overlying the left frontal calvarium.  IMPRESSION:  1.  No evidence of traumatic intracranial injury or fracture. 2.  Focal scalp hematoma overlying the left frontal calvarium. 3.  Moderate cortical volume loss and diffuse small vessel ischemic microangiopathy.  CT CERVICAL  SPINE  Findings: There is no evidence of acute fracture or subluxation. There is grade 1 retrolisthesis of C3 on C4.  As before, this results in narrowing of the central spinal canal to 7 mm in AP dimension on sagittal images.  In addition, there is significant bilateral foraminal narrowing at C3-C4, worse on the right.  There is diffuse narrowing of the intervertebral disc spaces along the cervical spine.  Mild grade 1 anterolisthesis is noted of C6 on C7.  Prevertebral soft tissues are within normal limits.  There is incomplete fusion of the posterior spinous process at C3.  The thyroid gland is unremarkable in appearance.  The visualized lung apices are clear.  Prominent calcification is noted at the carotid bifurcations bilaterally, worse on the right.  IMPRESSION:  1.  No evidence of acute fracture or subluxation along the cervical spine. 2.  Stable significant degenerative change noted along the cervical spine, particularly at C3-C4.  Central canal stenosis and foraminal narrowing noted at C3-C4, unchanged from 2010. 3.  Prominent calcification at the carotid bifurcations bilaterally, worse  on the right.  Carotid ultrasound would be helpful for further evaluation, when and as deemed clinically appropriate.  Original Report Authenticated By: Tonia Ghent, M.D.   Ct Cervical Spine Wo Contrast  08/05/2011  *RADIOLOGY REPORT*  Clinical Data:  Status post fall, with a scalp hematoma at the left forehead.  Concern for cervical spine injury.  CT HEAD WITHOUT CONTRAST AND CT CERVICAL SPINE WITHOUT CONTRAST  Technique:  Multidetector CT imaging of the head and cervical spine was performed following the standard protocol without intravenous contrast.  Multiplanar CT image reconstructions of the cervical spine were also generated.  Comparison: CT of the head performed 08/23/2009, and CT of the cervical spine performed 05/24/2009  CT HEAD  Findings: There is no evidence of acute infarction, mass lesion, or intra- or  extra-axial hemorrhage on CT.  Prominence of the ventricles and sulci reflects moderate cortical volume loss.  Cerebellar atrophy is noted.  Diffuse periventricular and subcortical white matter change likely reflects small vessel ischemic microangiopathy.  The brainstem and fourth ventricle are within normal limits.  The basal ganglia are unremarkable in appearance.  The cerebral hemispheres demonstrate grossly normal gray-white differentiation. No mass effect or midline shift is seen.  There is no evidence of fracture; visualized osseous structures are unremarkable in appearance.  The visualized portions of the orbits are within normal limits.  The paranasal sinuses and mastoid air cells are well-aerated.  Focal scalp hematoma is noted overlying the left frontal calvarium.  IMPRESSION:  1.  No evidence of traumatic intracranial injury or fracture. 2.  Focal scalp hematoma overlying the left frontal calvarium. 3.  Moderate cortical volume loss and diffuse small vessel ischemic microangiopathy.  CT CERVICAL SPINE  Findings: There is no evidence of acute fracture or subluxation. There is grade 1 retrolisthesis of C3 on C4.  As before, this results in narrowing of the central spinal canal to 7 mm in AP dimension on sagittal images.  In addition, there is significant bilateral foraminal narrowing at C3-C4, worse on the right.  There is diffuse narrowing of the intervertebral disc spaces along the cervical spine.  Mild grade 1 anterolisthesis is noted of C6 on C7.  Prevertebral soft tissues are within normal limits.  There is incomplete fusion of the posterior spinous process at C3.  The thyroid gland is unremarkable in appearance.  The visualized lung apices are clear.  Prominent calcification is noted at the carotid bifurcations bilaterally, worse on the right.  IMPRESSION:  1.  No evidence of acute fracture or subluxation along the cervical spine. 2.  Stable significant degenerative change noted along the cervical  spine, particularly at C3-C4.  Central canal stenosis and foraminal narrowing noted at C3-C4, unchanged from 2010. 3.  Prominent calcification at the carotid bifurcations bilaterally, worse on the right.  Carotid ultrasound would be helpful for further evaluation, when and as deemed clinically appropriate.  Original Report Authenticated By: Tonia Ghent, M.D.     1. Fall   2. Laceration of face       MDM  Physical exam difficult due to patient's mental status does wince with movement or palpation of neck and under her laceration of right anterior forehead.  Will CT head and neck suture passer        Arman Filter, NP 08/05/11 0416  Arman Filter, NP 08/05/11 1610  Arman Filter, NP 08/05/11 9604  Arman Filter, NP 08/05/11 [phone]

## 2011-08-05 NOTE — ED Notes (Signed)
Family at bedside. 

## 2011-08-05 NOTE — ED Notes (Signed)
[id number]<AV> Expected date:<BR> Expected time:<BR> Means of arrival:<BR> Comments:<BR> 77 yo female-fall at home- 1 &quot; lac to forehead

## 2011-08-30 NOTE — ED Provider Notes (Signed)
Medical screening examination/treatment/procedure(s) were conducted as a shared visit with non-physician practitioner(s) and myself.  I personally evaluated the patient during the encounter.  Pt with unwitnessed fall, has history of same in past.  Laceration repaired. CT unremarkable  Olivia Mackie, MD 08/30/11 1308

## 2011-09-17 ENCOUNTER — Encounter: Payer: Self-pay | Admitting: *Deleted

## 2011-10-14 ENCOUNTER — Encounter: Payer: Self-pay | Admitting: Internal Medicine

## 2011-10-14 ENCOUNTER — Telehealth: Payer: Self-pay | Admitting: Internal Medicine

## 2011-10-14 NOTE — Telephone Encounter (Signed)
10-14-11 called pt to set up pacemaker ck with device, was due in December, n/a unable to leave msg, will send past due letter/mt

## 2011-11-27 ENCOUNTER — Emergency Department (HOSPITAL_COMMUNITY): Payer: Medicare Other

## 2011-11-27 ENCOUNTER — Encounter: Payer: Self-pay | Admitting: Ophthalmology

## 2011-11-27 ENCOUNTER — Inpatient Hospital Stay (HOSPITAL_COMMUNITY)
Admission: EM | Admit: 2011-11-27 | Discharge: 2011-12-04 | DRG: 939 | Disposition: A | Discharge status: Skilled Nursing Facility | Payer: Medicare Other | Attending: Internal Medicine | Admitting: Internal Medicine

## 2011-11-27 ENCOUNTER — Other Ambulatory Visit: Payer: Self-pay

## 2011-11-27 ENCOUNTER — Encounter (HOSPITAL_COMMUNITY): Payer: Self-pay | Admitting: *Deleted

## 2011-11-27 DIAGNOSIS — H2510 Age-related nuclear cataract, unspecified eye: Secondary | ICD-10-CM | POA: Diagnosis present

## 2011-11-27 DIAGNOSIS — R680 Hypothermia, not associated with low environmental temperature: Principal | ICD-10-CM | POA: Diagnosis present

## 2011-11-27 DIAGNOSIS — E43 Unspecified severe protein-calorie malnutrition: Secondary | ICD-10-CM | POA: Diagnosis present

## 2011-11-27 DIAGNOSIS — H04309 Unspecified dacryocystitis of unspecified lacrimal passage: Secondary | ICD-10-CM | POA: Diagnosis present

## 2011-11-27 DIAGNOSIS — R460 Very low level of personal hygiene: Secondary | ICD-10-CM | POA: Diagnosis present

## 2011-11-27 DIAGNOSIS — D638 Anemia in other chronic diseases classified elsewhere: Secondary | ICD-10-CM | POA: Diagnosis present

## 2011-11-27 DIAGNOSIS — L8993 Pressure ulcer of unspecified site, stage 3: Secondary | ICD-10-CM | POA: Diagnosis present

## 2011-11-27 DIAGNOSIS — I10 Essential (primary) hypertension: Secondary | ICD-10-CM | POA: Diagnosis present

## 2011-11-27 DIAGNOSIS — R1310 Dysphagia, unspecified: Secondary | ICD-10-CM | POA: Diagnosis present

## 2011-11-27 DIAGNOSIS — B952 Enterococcus as the cause of diseases classified elsewhere: Secondary | ICD-10-CM | POA: Diagnosis present

## 2011-11-27 DIAGNOSIS — E039 Hypothyroidism, unspecified: Secondary | ICD-10-CM | POA: Diagnosis present

## 2011-11-27 DIAGNOSIS — I509 Heart failure, unspecified: Secondary | ICD-10-CM | POA: Diagnosis present

## 2011-11-27 DIAGNOSIS — I4891 Unspecified atrial fibrillation: Secondary | ICD-10-CM | POA: Diagnosis present

## 2011-11-27 DIAGNOSIS — Z66 Do not resuscitate: Secondary | ICD-10-CM | POA: Diagnosis present

## 2011-11-27 DIAGNOSIS — F0390 Unspecified dementia without behavioral disturbance: Secondary | ICD-10-CM | POA: Diagnosis present

## 2011-11-27 DIAGNOSIS — L89153 Pressure ulcer of sacral region, stage 3: Secondary | ICD-10-CM | POA: Diagnosis present

## 2011-11-27 DIAGNOSIS — Z681 Body mass index (BMI) 19 or less, adult: Secondary | ICD-10-CM

## 2011-11-27 DIAGNOSIS — I495 Sick sinus syndrome: Secondary | ICD-10-CM | POA: Diagnosis present

## 2011-11-27 DIAGNOSIS — R64 Cachexia: Secondary | ICD-10-CM | POA: Diagnosis present

## 2011-11-27 DIAGNOSIS — N390 Urinary tract infection, site not specified: Secondary | ICD-10-CM | POA: Diagnosis present

## 2011-11-27 DIAGNOSIS — E871 Hypo-osmolality and hyponatremia: Secondary | ICD-10-CM | POA: Diagnosis present

## 2011-11-27 DIAGNOSIS — I959 Hypotension, unspecified: Secondary | ICD-10-CM | POA: Diagnosis present

## 2011-11-27 DIAGNOSIS — R627 Adult failure to thrive: Secondary | ICD-10-CM | POA: Diagnosis present

## 2011-11-27 DIAGNOSIS — Z79899 Other long term (current) drug therapy: Secondary | ICD-10-CM

## 2011-11-27 DIAGNOSIS — T68XXXA Hypothermia, initial encounter: Secondary | ICD-10-CM | POA: Diagnosis present

## 2011-11-27 DIAGNOSIS — R7989 Other specified abnormal findings of blood chemistry: Secondary | ICD-10-CM | POA: Diagnosis present

## 2011-11-27 DIAGNOSIS — H04002 Unspecified dacryoadenitis, left lacrimal gland: Secondary | ICD-10-CM | POA: Diagnosis present

## 2011-11-27 DIAGNOSIS — Z950 Presence of cardiac pacemaker: Secondary | ICD-10-CM

## 2011-11-27 DIAGNOSIS — I5032 Chronic diastolic (congestive) heart failure: Secondary | ICD-10-CM | POA: Diagnosis present

## 2011-11-27 DIAGNOSIS — I5189 Other ill-defined heart diseases: Secondary | ICD-10-CM | POA: Diagnosis present

## 2011-11-27 DIAGNOSIS — L89109 Pressure ulcer of unspecified part of back, unspecified stage: Secondary | ICD-10-CM | POA: Diagnosis present

## 2011-11-27 HISTORY — DX: Presence of cardiac pacemaker: Z95.0

## 2011-11-27 HISTORY — DX: Atherosclerotic heart disease of native coronary artery without angina pectoris: I25.10

## 2011-11-27 LAB — CARDIAC PANEL(CRET KIN+CKTOT+MB+TROPI)
CK, MB: 7.9 ng/mL (ref 0.3–4.0)
Total CK: 131 U/L (ref 7–177)
Total CK: 117 U/L (ref 7–177)
Troponin I: <0.3 ng/mL (ref <0.30)

## 2011-11-27 LAB — COMPREHENSIVE METABOLIC PANEL
ALT: 98 U/L — ABNORMAL HIGH (ref 0–35)
Calcium: 8.3 mg/dL — ABNORMAL LOW (ref 8.4–10.5)
GFR calc Af Amer: >90 mL/min (ref >90)
Glucose, Bld: 74 mg/dL (ref 70–99)
Sodium: 124 mEq/L — ABNORMAL LOW (ref 135–145)
Total Protein: 5.2 g/dL — ABNORMAL LOW (ref 6.0–8.3)

## 2011-11-27 LAB — URINE MICROSCOPIC-ADD ON

## 2011-11-27 LAB — IRON AND TIBC
Saturation Ratios: 34 % (ref 20–55)
TIBC: 232 ug/dL — ABNORMAL LOW (ref 250–470)

## 2011-11-27 LAB — CBC
MCH: 30.9 pg (ref 26.0–34.0)
MCV: 87.6 fL (ref 78.0–100.0)
Platelets: 193 10*3/uL (ref 150–400)
RDW: 13.8 % (ref 11.5–15.5)

## 2011-11-27 LAB — DIFFERENTIAL
Eosinophils Absolute: 0.1 10*3/uL (ref 0.0–0.7)
Eosinophils Relative: 1 % (ref 0–5)
Lymphs Abs: 1.5 10*3/uL (ref 0.7–4.0)

## 2011-11-27 LAB — URINALYSIS, ROUTINE W REFLEX MICROSCOPIC
Bilirubin Urine: NEGATIVE
Nitrite: NEGATIVE
Specific Gravity, Urine: 1.017 (ref 1.005–1.030)
pH: 6 (ref 5.0–8.0)

## 2011-11-27 LAB — GLUCOSE, CAPILLARY
Glucose-Capillary: 77 mg/dL (ref 70–99)
Glucose-Capillary: 86 mg/dL (ref 70–99)

## 2011-11-27 LAB — RETICULOCYTES
RBC.: 3.23 MIL/uL — ABNORMAL LOW (ref 3.87–5.11)
Retic Ct Pct: 0.8 % (ref 0.4–3.1)

## 2011-11-27 LAB — LIPASE, BLOOD: Lipase: 18 U/L (ref 11–59)

## 2011-11-27 LAB — FERRITIN: Ferritin: 333 ng/mL — ABNORMAL HIGH (ref 10–291)

## 2011-11-27 LAB — OSMOLALITY, URINE: Osmolality, Ur: 520 mOsm/kg (ref 390–1090)

## 2011-11-27 MED ORDER — DIGOXIN 125 MCG PO TABS
125.0000 ug | ORAL_TABLET | Freq: Every day | ORAL | Status: DC
  Administered 2011-11-27: 125 ug via ORAL
  Filled 2011-11-27 (×2): qty 1

## 2011-11-27 MED ORDER — SODIUM CHLORIDE 0.9 % IV BOLUS (SEPSIS)
500.0000 mL | Freq: Once | INTRAVENOUS | Status: AC
  Administered 2011-11-27: 500 mL via INTRAVENOUS

## 2011-11-27 MED ORDER — ONDANSETRON HCL 4 MG/2ML IJ SOLN
4.0000 mg | Freq: Four times a day (QID) | INTRAMUSCULAR | Status: DC | PRN
  Administered 2011-12-01: 4 mg via INTRAVENOUS
  Filled 2011-11-27: qty 2

## 2011-11-27 MED ORDER — MAGNESIUM SULFATE 40 MG/ML IJ SOLN
2.0000 g | Freq: Once | INTRAMUSCULAR | Status: AC
  Administered 2011-11-27: 2 g via INTRAVENOUS
  Filled 2011-11-27: qty 50

## 2011-11-27 MED ORDER — SODIUM CHLORIDE 0.9 % IV SOLN: Freq: Once | INTRAVENOUS | Status: DC

## 2011-11-27 MED ORDER — ONDANSETRON HCL 4 MG PO TABS: 4.0000 mg | ORAL_TABLET | Freq: Four times a day (QID) | ORAL | Status: DC | PRN

## 2011-11-27 MED ORDER — SODIUM CHLORIDE 0.9 % IJ SOLN
3.0000 mL | Freq: Two times a day (BID) | INTRAMUSCULAR | Status: DC
  Administered 2011-11-27 – 2011-11-29 (×5): 3 mL via INTRAVENOUS
  Administered 2011-11-29: 11:00:00 via INTRAVENOUS
  Administered 2011-11-30 – 2011-12-04 (×5): 3 mL via INTRAVENOUS

## 2011-11-27 MED ORDER — ALBUTEROL SULFATE (5 MG/ML) 0.5% IN NEBU: 2.5000 mg | INHALATION_SOLUTION | RESPIRATORY_TRACT | Status: DC | PRN

## 2011-11-27 MED ORDER — PIPERACILLIN-TAZOBACTAM 3.375 G IVPB
3.3750 g | Freq: Three times a day (TID) | INTRAVENOUS | Status: DC
  Administered 2011-11-27 – 2011-12-03 (×18): 3.375 g via INTRAVENOUS
  Filled 2011-11-27 (×23): qty 50

## 2011-11-27 MED ORDER — GUAIFENESIN-DM 100-10 MG/5ML PO SYRP
5.0000 mL | ORAL_SOLUTION | ORAL | Status: DC | PRN
  Filled 2011-11-27: qty 5

## 2011-11-27 MED ORDER — SODIUM CHLORIDE 0.9 % IV SOLN
INTRAVENOUS | Status: AC
  Administered 2011-11-27: 11:00:00 via INTRAVENOUS

## 2011-11-27 MED ORDER — CIPROFLOXACIN HCL 0.3 % OP SOLN
2.0000 [drp] | Freq: Two times a day (BID) | OPHTHALMIC | Status: DC
  Administered 2011-11-27 – 2011-12-04 (×14): 2 [drp] via OPHTHALMIC
  Filled 2011-11-27 (×3): qty 2.5

## 2011-11-27 MED ORDER — SODIUM CHLORIDE 0.9 % IV SOLN
INTRAVENOUS | Status: AC
  Administered 2011-11-27 (×2): via INTRAVENOUS

## 2011-11-27 MED ORDER — HYDROCODONE-ACETAMINOPHEN 5-325 MG PO TABS
1.0000 | ORAL_TABLET | ORAL | Status: DC | PRN
  Filled 2011-11-27: qty 1

## 2011-11-27 NOTE — Progress Notes (Signed)
PT Cancellation Note  Treatment cancelled today due to medical issues with patient which prohibited therapy. Spoke with RN who suggested to hold PT eval until Monday, pt is severely dehydrated. Will attempt evaluation Monday pending medical stability.  Thanks, 11/27/2011 Milana Kidney DPT PAGER: [phone] OFFICE: [phone]    Milana Kidney 11/27/2011, 3:16 PM

## 2011-11-27 NOTE — H&P (Addendum)
Andrea Koch [id number]  Outpatient Primary MD for the patient is Provider Not In System  With History of -  Past Medical History  Diagnosis Date  . Dementia   . HTN (hypertension)   . Atrial fibrillation     paroxysmal  . Tachycardia-bradycardia syndrome     s/p PPM by Dr Deborah Chalk  . GI bleed 2/12    ulcer to ASA  . Diastolic dysfunction   . CHF (congestive heart failure)       Past Surgical History  Procedure Date  . Pacemaker insertion 09/02/09    by Dr Deborah Chalk due to tachycardia bradycardia syndrome and syncope (MDT)    in for   Chief Complaint  Patient presents with  . Altered Mental Status     HPI  Andrea Koch  is a 78 y.o. female, with advanced dementia, atrial fibrillation, sick sinus syndrome who lives with her son, was brought in after son noted that her blood pressure was high according to the son they had a family member visiting who had a blood pressure instrument he proceeded with checking her blood pressure and found it to be high and called EMS for patient to be brought to the hospital.  Upon arrival to the hospital patient was noted to to be cachectic, hypothermic, extremely poor hygiene, hypotensive tachycardic hyponatremic with multiple skin tears, left lacrimal gland infection, cellulitis in her sacral region and I was called to admit the patient.    Review of Systems    Unobtainable due to dementia   Social History History  Substance Use Topics  . Smoking status: Never Smoker   . Smokeless tobacco: Not on file  . Alcohol Use: No     Family History No dementia in the family her son  Prior to Admission medications   Medication Sig Start Date End Date Taking? Authorizing Provider  clonazePAM (KLONOPIN) 0.5 MG tablet Take 0.25-0.5 mg by mouth at bedtime as needed. For anxiety / sleep   Yes Historical Provider, MD  digoxin (LANOXIN) 0.125 MG tablet Take 1 tablet (125 mcg total) by mouth daily. 03/30/11  Yes Peter M  [place], MD  furosemide (LASIX) 20 MG tablet Take 20 mg by mouth daily as needed. For leg swelling   Yes Historical Provider, MD  metoprolol tartrate (LOPRESSOR) 25 MG tablet Take 12.5-25 mg by mouth 2 (two) times daily. if lower then 110 hold medication   Yes Historical Provider, MD    Allergies  Allergen Reactions  . Aspirin Other (See Comments)    Ulcer - bleeding  . Sulfonamide Derivatives     Physical Exam  Vitals  Blood pressure 97/47, pulse 61, temperature 94.6 F (34.8 C), temperature source Rectal, resp. rate 16, SpO2 96.00%.   1. General frail cachectic elderly white female in extremely poor personal hygiene lying in bed in NAD,   2. Awake Alert, Oriented X0. Unreliable historian  3. No F.N deficits, ALL C.Nerves Intact, Strength 5/5 all 4 extremities, Sensation intact all 4 extremities, Plantars down going.  4. Swollen left lacrimal gland with evidence of Dacryoadenitis of left lacrimal gland , PERRLA. Moist Oral Mucosa.  5. Supple Neck, No JVD, No cervical lymphadenopathy appriciated, No Carotid Bruits.  6. Symmetrical Chest wall movement, Good air movement bilaterally, CTAB.  7. RRR, No Gallops, Rubs or Murmurs, No Parasternal Heave.  8. Positive Bowel Sounds, Abdomen Soft, Non tender, No organomegaly appriciated,No rebound -guarding or rigidity.  9.  No Cyanosis, Normal Skin Turgor, multiple skin tears and decubitus  ulcer stage III between the gluteal folds noted with surrounding cellulitis  10. Good muscle tone,  joints appear normal , no effusions, Normal ROM.  11. No Palpable Lymph Nodes in Neck or Axillae     Data Review  CBC  Lab 11/27/11 0747  WBC 4.2  HGB 10.2*  HCT 28.9*  PLT 193  MCV 87.6  MCH 30.9  MCHC 35.3  RDW 13.8  LYMPHSABS 1.5  MONOABS 0.3  EOSABS 0.1  BASOSABS 0.0  BANDABS --   ------------------------------------------------------------------------------------------------------------------ Chemistries   Lab 11/27/11 0747   NA 124*  K 4.2  CL 88*  CO2 26  GLUCOSE 74  BUN 17  CREATININE 0.36*  CALCIUM 8.3*  MG --  AST 79*  ALT 98*  ALKPHOS 217*  BILITOT 0.4   ------------------------------------------------------------------------------------------------------------------ CrCl is unknown because both a height and weight (above a minimum accepted value) are required for this calculation. ------------------------------------------------------------------------------------------------------------------ No results found for this basename: TSH,T4TOTAL,FREET3,T3FREE,THYROIDAB in the last 72 hours  Coagulation profile No results found for this basename: INR:5,PROTIME:5 in the last 168 hours ------------------------------------------------------------------------------------------------------------------- No results found for this basename: DDIMER:2 in the last 72 hours ------------------------------------------------------------------------------------------------------------------- Cardiac Enzymes  Lab 11/27/11 0747  CKMB 7.9*  TROPONINI <0.30  MYOGLOBIN --   ------------------------------------------------------------------------------------------------------------------ No components found with this basename: POCBNP:3 ------------------------------------------------------------------------------------------------------------------  Imaging results:   Dg Chest 1 View  11/27/2011  *RADIOLOGY REPORT*  Clinical Data: Dimension, CHF, atrial fibrillation, elevated blood pressure  CHEST - 1 VIEW  Comparison: June 17, 2010  Findings: The cardiac silhouette, mediastinum, pulmonary vasculature are within normal limits.  Pacemaker leads are intact and stable, in good position.  Both lungs are clear.  IMPRESSION: Stable chest x-ray with no evidence of acute cardiac or pulmonary process.  Original Report Authenticated By: Brandon Melnick, M.D.   Ct Head Wo Contrast  11/27/2011  *RADIOLOGY REPORT*  Clinical  Data: Mental status change  CT HEAD WITHOUT CONTRAST  Technique:  Contiguous axial images were obtained from the base of the skull through the vertex without contrast.  Comparison: 08/05/2011  Findings: There is diffuse patchy low density throughout the subcortical and periventricular white matter consistent with chronic small vessel ischemic change.  There is prominence of the sulci and ventricles consistent with brain atrophy.  There is no evidence for acute brain infarct, hemorrhage or mass.  The mastoid air cells and paranasal sinuses are clear.  The skull appears intact.  IMPRESSION:  1.  Small vessel ischemic change and brain atrophy. 2.  No acute intracranial abnormalities.  Original Report Authenticated By: Rosealee Albee, M.D.    My personal review of EKG: Rhythm NSR, Rate 60 /min, QTc 424 , Lat T wave inversions     Assessment & Plan  1.Hypothermia -in a patient with dementia which is advanced, in extremely poor hygiene, cachexia with family unable to take care of her -patient already is on a warming blanket it will be continued, she will be monitored in step down, as her temperature is rising blood pressure is starting to drop, we'll give her normal saline bolus as her systolic is an 80 tachycardia, she clearly is dehydrated by visual inspection, she will continue to get temperature monitoring in step down unit.   2. Hyponatremia, cachexia, poor personal hygiene, dehydration - IV fluids, social work PT OT to see patient, will have speech also see the patient and evaluate for aspiration risk, nursing staff follow full aspiration precautions and assist patient with feeding, IV fluids for hydration for  now, nutritionist input will be obtained.   3. History of  Atrial fibrillation, Sinoatrial node dysfunction, Tachycardia-bradycardia syndrome, chronic Diastolic dysfunction -for now digoxin will be continued, I will obtain a digoxin level, beta blocker will be resumed once blood pressure is  better, patient currently appears to be in sinus rhythm, on bedside telemetry monitor QTC did prolonged due to hypothermia and 2 g of magnesium already been given IV. She will get telemetry, he is extremely poor candidate for anti-coagulation, and is allergic to aspirin.   4. T wave depression on EKG, patient denies any chest pain although review of systems is unreliable, cardiac enzymes are negative, beta blocker will be initiated once blood pressure is better, she is allergic to aspirin.   5.Dacryoadenitis of left lacrimal gland -I have requested ophthalmologist on call to see the patient left message for Dr. Karleen Hampshire, for now Zosyn will be continued as for #6 below.   6.Sacral decubitus ulcer, stage III-old care consultation and Zosyn to be continued.   7.Hyponatremia -due to dehydration, urine and serum osmolality and urine sodium ordered.   8. Elevated liver enzymes unclear etiology Will check right upper quadrant ultrasound right upper quadrant of the abdomen does not appear to be tender. We'll check lipase.   9. Anemia of chronic disease will monitor likely to fall after hydration, will order anemia panel.    DVT Prophylaxis  SCDs    AM Labs Ordered, also please review Full Orders  Admission, patients condition and plan of care including tests being ordered have been discussed with the patient and son who indicate understanding and agree with the plan and Code Status.  Code Status DNR  Condition GUARDED   Leroy Sea M.D on 11/27/2011 at 10:31 AM  [facility] Office  [phone]

## 2011-11-27 NOTE — Consult Note (Signed)
ANTIBIOTIC CONSULT NOTE - INITIAL  Pharmacy Consult for Zosyn Indication: sacral decubitus ulcer w/ cellulitis  Allergies  Allergen Reactions  . Aspirin Other (See Comments)    Ulcer - bleeding  . Sulfonamide Derivatives    Patient Measurements: Height: 5' 6.93" (170 cm) Weight: 119 lb 14.9 oz (54.4 kg) IBW/kg (Calculated) : 61.44   Vital Signs: Temp: 95.2 F (35.1 C) (03/30 1130) Temp src: Rectal (03/30 1039) BP: 80/41 mmHg (03/30 1130) Pulse Rate: 62  (03/30 1130)  Labs:  [facility] 11/27/11 0747  WBC 4.2  HGB 10.2*  PLT 193  LABCREA --  CREATININE 0.36*   Estimated Creatinine Clearance: 50.6 ml/min (by C-G formula based on Cr of 0.36).  Microbiology: No results found for this or any previous visit (from the past 720 hour(s)).  Medical History: Past Medical History  Diagnosis Date  . Dementia   . HTN (hypertension)   . Atrial fibrillation     paroxysmal  . Tachycardia-bradycardia syndrome     s/p PPM by Dr Deborah Chalk  . GI bleed 2/12    ulcer to ASA  . Diastolic dysfunction   . CHF (congestive heart failure)    Assessment: 79yof with a stage III sacral decubitus ulcer and surrounding cellulitis to begin empiric zosyn.   Goal of Therapy:  Resolution of infection  Plan:  1) Zosyn 3.375g q8 2) Follow up renal function, cultures, clinical course   Fredrik Rigger 11/27/2011,1:22 PM

## 2011-11-27 NOTE — ED Notes (Signed)
Attempted to call report. Extension taken and nurse will call back

## 2011-11-27 NOTE — Consult Note (Addendum)
Reason for Consult:79 yo WF with Periocular inflammation os and lacrimal sac inflammation R/O dacryocystitis os Referring Physician: Thedore Mins MD  Andrea Koch is an 78 y.o. female.  HPI:  78 year old female with a history of hyponatremia hypotension and decubiti she has been consulted for a periocular inflammation with minimal discharge, and swelling of the nasal lacrimal sac left of the left eye. Past Medical History  Diagnosis Date  . Dementia   . HTN (hypertension)   . Atrial fibrillation     paroxysmal  . Tachycardia-bradycardia syndrome     s/p PPM by Dr Deborah Chalk  . GI bleed 2/12    ulcer to ASA  . Diastolic dysfunction   . CHF (congestive heart failure)     Past Surgical History  Procedure Date  . Pacemaker insertion 09/02/09    by Dr Deborah Chalk due to tachycardia bradycardia syndrome and syncope (MDT)    History reviewed. No pertinent family history.  Social History:  reports that she has never smoked. She does not have any smokeless tobacco history on file. She reports that she does not drink alcohol or use illicit drugs.  Allergies:  Allergies  Allergen Reactions  . Aspirin Other (See Comments)    Ulcer - bleeding  . Sulfonamide Derivatives     Medications: I have reviewed the patient's current medications.  Results for orders placed during the hospital encounter of 11/27/11 (from the past 48 hour(s))  CBC     Status: Abnormal   Collection Time   11/27/11  7:47 AM      Component Value Range Comment   WBC 4.2  4.0 - 10.5 (K/uL)    RBC 3.30 (*) 3.87 - 5.11 (MIL/uL)    Hemoglobin 10.2 (*) 12.0 - 15.0 (g/dL)    HCT 14.7 (*) 82.9 - 46.0 (%)    MCV 87.6  78.0 - 100.0 (fL)    MCH 30.9  26.0 - 34.0 (pg)    MCHC 35.3  30.0 - 36.0 (g/dL)    RDW 56.2  13.0 - 86.5 (%)    Platelets 193  150 - 400 (K/uL)   DIFFERENTIAL     Status: Normal   Collection Time   11/27/11  7:47 AM      Component Value Range Comment   Neutrophils Relative 55  43 - 77 (%)    Neutro Abs 2.3  1.7  - 7.7 (K/uL)    Lymphocytes Relative 37  12 - 46 (%)    Lymphs Abs 1.5  0.7 - 4.0 (K/uL)    Monocytes Relative 7  3 - 12 (%)    Monocytes Absolute 0.3  0.1 - 1.0 (K/uL)    Eosinophils Relative 1  0 - 5 (%)    Eosinophils Absolute 0.1  0.0 - 0.7 (K/uL)    Basophils Relative 1  0 - 1 (%)    Basophils Absolute 0.0  0.0 - 0.1 (K/uL)   COMPREHENSIVE METABOLIC PANEL     Status: Abnormal   Collection Time   11/27/11  7:47 AM      Component Value Range Comment   Sodium 124 (*) 135 - 145 (mEq/L)    Potassium 4.2  3.5 - 5.1 (mEq/L)    Chloride 88 (*) 96 - 112 (mEq/L)    CO2 26  19 - 32 (mEq/L)    Glucose, Bld 74  70 - 99 (mg/dL)    BUN 17  6 - 23 (mg/dL)    Creatinine, Ser 7.84 (*) 0.50 - 1.10 (mg/dL)  Calcium 8.3 (*) 8.4 - 10.5 (mg/dL)    Total Protein 5.2 (*) 6.0 - 8.3 (g/dL)    Albumin 2.6 (*) 3.5 - 5.2 (g/dL)    AST 79 (*) 0 - 37 (U/L)    ALT 98 (*) 0 - 35 (U/L)    Alkaline Phosphatase 217 (*) 39 - 117 (U/L)    Total Bilirubin 0.4  0.3 - 1.2 (mg/dL)    GFR calc non Af Amer >90  >90 (mL/min)    GFR calc Af Amer >90  >90 (mL/min)   LACTIC ACID, PLASMA     Status: Normal   Collection Time   11/27/11  7:47 AM      Component Value Range Comment   Lactic Acid, Venous 1.0  0.5 - 2.2 (mmol/L)   DIGOXIN LEVEL     Status: Abnormal   Collection Time   11/27/11  7:47 AM      Component Value Range Comment   Digoxin Level 0.6 (*) 0.8 - 2.0 (ng/mL)   CARDIAC PANEL(CRET KIN+CKTOT+MB+TROPI)     Status: Abnormal   Collection Time   11/27/11  7:47 AM      Component Value Range Comment   Total CK 131  7 - 177 (U/L)    CK, MB 7.9 (*) 0.3 - 4.0 (ng/mL) CRITICAL VALUE NOTED.  VALUE IS CONSISTENT WITH PREVIOUSLY REPORTED AND CALLED VALUE.   Troponin I <0.30  <0.30 (ng/mL)    Relative Index 6.0 (*) 0.0 - 2.5    GLUCOSE, CAPILLARY     Status: Normal   Collection Time   11/27/11  7:50 AM      Component Value Range Comment   Glucose-Capillary 86  70 - 99 (mg/dL)   GLUCOSE, CAPILLARY     Status: Normal    Collection Time   11/27/11  7:52 AM      Component Value Range Comment   Glucose-Capillary 77  70 - 99 (mg/dL)    Comment 1 Documented in Chart      Comment 2 Notify RN     URINALYSIS, ROUTINE W REFLEX MICROSCOPIC     Status: Abnormal   Collection Time   11/27/11  8:04 AM      Component Value Range Comment   Color, Urine YELLOW  YELLOW     APPearance CLEAR  CLEAR     Specific Gravity, Urine 1.017  1.005 - 1.030     pH 6.0  5.0 - 8.0     Glucose, UA NEGATIVE  NEGATIVE (mg/dL)    Hgb urine dipstick NEGATIVE  NEGATIVE     Bilirubin Urine NEGATIVE  NEGATIVE     Ketones, ur NEGATIVE  NEGATIVE (mg/dL)    Protein, ur NEGATIVE  NEGATIVE (mg/dL)    Urobilinogen, UA 1.0  0.0 - 1.0 (mg/dL)    Nitrite NEGATIVE  NEGATIVE     Leukocytes, UA SMALL (*) NEGATIVE    URINE MICROSCOPIC-ADD ON     Status: Normal   Collection Time   11/27/11  8:04 AM      Component Value Range Comment   Squamous Epithelial / LPF RARE  RARE     WBC, UA 0-2  <3 (WBC/hpf)   POCT I-STAT TROPONIN I     Status: Normal   Collection Time   11/27/11  8:43 AM      Component Value Range Comment   Troponin i, poc 0.00  0.00 - 0.08 (ng/mL)    Comment 3  OSMOLALITY     Status: Abnormal   Collection Time   11/27/11  9:40 AM      Component Value Range Comment   Osmolality 255 (*) 275 - 300 (mOsm/kg)   TSH     Status: Abnormal   Collection Time   11/27/11  9:40 AM      Component Value Range Comment   TSH 13.906 (*) 0.350 - 4.500 (uIU/mL)   T4     Status: Normal   Collection Time   11/27/11  9:40 AM      Component Value Range Comment   T4, Total 8.4  5.0 - 12.5 (ug/dL)   SODIUM, URINE, RANDOM     Status: Normal   Collection Time   11/27/11 10:29 AM      Component Value Range Comment   Sodium, Ur 33     LIPASE, BLOOD     Status: Normal   Collection Time   11/27/11 10:58 AM      Component Value Range Comment   Lipase 18  11 - 59 (U/L)   RETICULOCYTES     Status: Abnormal   Collection Time   11/27/11 10:58 AM       Component Value Range Comment   Retic Ct Pct 0.8  0.4 - 3.1 (%)    RBC. 3.23 (*) 3.87 - 5.11 (MIL/uL)    Retic Count, Manual 25.8  19.0 - 186.0 (K/uL)   MRSA PCR SCREENING     Status: Normal   Collection Time   11/27/11  1:16 PM      Component Value Range Comment   MRSA by PCR NEGATIVE  NEGATIVE    DIGOXIN LEVEL     Status: Abnormal   Collection Time   11/27/11  1:50 PM      Component Value Range Comment   Digoxin Level 0.5 (*) 0.8 - 2.0 (ng/mL)     Dg Chest 1 View  11/27/2011  *RADIOLOGY REPORT*  Clinical Data: Dimension, CHF, atrial fibrillation, elevated blood pressure  CHEST - 1 VIEW  Comparison: June 17, 2010  Findings: The cardiac silhouette, mediastinum, pulmonary vasculature are within normal limits.  Pacemaker leads are intact and stable, in good position.  Both lungs are clear.  IMPRESSION: Stable chest x-ray with no evidence of acute cardiac or pulmonary process.  Original Report Authenticated By: Brandon Melnick, M.D.   Ct Head Wo Contrast  11/27/2011  *RADIOLOGY REPORT*  Clinical Data: Mental status change  CT HEAD WITHOUT CONTRAST  Technique:  Contiguous axial images were obtained from the base of the skull through the vertex without contrast.  Comparison: 08/05/2011  Findings: There is diffuse patchy low density throughout the subcortical and periventricular white matter consistent with chronic small vessel ischemic change.  There is prominence of the sulci and ventricles consistent with brain atrophy.  There is no evidence for acute brain infarct, hemorrhage or mass.  The mastoid air cells and paranasal sinuses are clear.  The skull appears intact.  IMPRESSION:  1.  Small vessel ischemic change and brain atrophy. 2.  No acute intracranial abnormalities.  Original Report Authenticated By: Rosealee Albee, M.D.   US Abdomen Complete  11/27/2011  *RADIOLOGY REPORT*  Clinical Data:  Unresponsive, elevated LFTs  COMPLETE ABDOMINAL ULTRASOUND  Comparison:  None.  Findings:   Gallbladder:  Is distended with a striated thickened wall measuring up to 4 mm in thickness.  There is layering sludge in the lumen. Equivocal sonographic Murphy's sign.  There is a trace  amount of pericholecystic fluid.  Common bile duct:  4 mm diameter, unremarkable.  Liver:  No focal lesion identified.  Within normal limits in parenchymal echogenicity.  IVC:  Appears normal.  Pancreas:  Largely obscured by overlying bowel gas.  Spleen:  8.4 cm craniocaudal length, unremarkable  Right Kidney:  9.7 cm, isoechoic to the adjacent liver.  No focal lesion or hydronephrosis.  Left Kidney:  8.3 cm. No hydronephrosis.  Well-preserved cortex. Normal size and parenchymal echotexture without focal abnormalities.  Abdominal aorta:  Atheromatous calcifications without aneurysm.  Note made of a right pleural effusion.  IMPRESSION:  1. Distended gallbladder with thickened   wall and pericholecystic fluid, suggestive of cholecystitis.  If there is continued clinical uncertainty, hepatobiliary scintigraphy may be useful. 2.  Small right pleural effusion.  Original Report Authenticated By: Osa Craver, M.D.    Review of Systems  Eyes: Positive for discharge.  Skin: Positive for rash.  Neurological: Positive for speech change and weakness.   Blood pressure 102/38, pulse 65, temperature 97.6 F (36.4 C), temperature source Oral, resp. rate 10, height 5' 6.93" (1.7 m), weight 54.4 kg (119 lb 14.9 oz), SpO2 99.00%. Physical Exam  HENT:  Head:    Eyes: EOM are normal. Pupils are equal, round, and reactive to light. Left eye exhibits discharge.      Assessment/Plan: #1 dacryocystitis OS  #2 nuclear sclerotic cataracts each eye  #3 nuclear sclerotic cataracts each eye  #4 normal fundus each eye.  Plan: Nasolacrimal duct probing and irrigation with  topical anesthesia at bedside. # 2 :  Continue  Nasolacrimal sac massage QID :  Start Ciloxan gtts os BID . # 3 : Re- evaluate in 1 wk if  NI.  Danarius Mcconathy A 11/27/2011, 5:42 PM

## 2011-11-27 NOTE — ED Notes (Signed)
Son's work number if needed [phone]

## 2011-11-27 NOTE — Progress Notes (Signed)
INITIAL ADULT NUTRITION ASSESSMENT Date: 11/27/2011   Time: 1:37 PM  Reason for Assessment: Consult, Patient appears malnourished  ASSESSMENT: Female 78 y.o.  Dx: Hypothermia  Hx:  Past Medical History  Diagnosis Date  . Dementia   . HTN (hypertension)   . Atrial fibrillation     paroxysmal  . Tachycardia-bradycardia syndrome     s/p PPM by Dr Deborah Chalk  . GI bleed 2/12    ulcer to ASA  . Diastolic dysfunction   . CHF (congestive heart failure)     Related Meds:     . digoxin  125 mcg Oral Daily  . magnesium sulfate 1 - 4 g bolus IVPB  2 g Intravenous Once  . piperacillin-tazobactam (ZOSYN)  IV  3.375 g Intravenous Q8H  . sodium chloride  3 mL Intravenous Q12H  . DISCONTD: sodium chloride   Intravenous Once    Ht: 5' 6.93" (170 cm)  Wt: 119 lb 14.9 oz (54.4 kg)  Ideal Wt: 61.44 kg  % Ideal Wt: 89%  Usual Wt: Unknown % Usual Wt: Unknown  Body mass index is 18.82 kg/(m^2).  Food/Nutrition Related Hx: Unable to obtain history from patient due to advanced dementia. No family present at this time. Patient lives with son. Patient is cachectic, with stage 2 pressure ulcer on sacrum. No diet ordered at this time. SLP consult noted. Suspect some degree of malnutrition, but unable to diagnose due to lack of diet history.   Labs:  CMP     Component Value Date/Time   NA 124* 11/27/2011 0747   K 4.2 11/27/2011 0747   CL 88* 11/27/2011 0747   CO2 26 11/27/2011 0747   GLUCOSE 74 11/27/2011 0747   BUN 17 11/27/2011 0747   CREATININE 0.36* 11/27/2011 0747   CALCIUM 8.3* 11/27/2011 0747   PROT 5.2* 11/27/2011 0747   ALBUMIN 2.6* 11/27/2011 0747   AST 79* 11/27/2011 0747   ALT 98* 11/27/2011 0747   ALKPHOS 217* 11/27/2011 0747   BILITOT 0.4 11/27/2011 0747   GFRNONAA >90 11/27/2011 0747   GFRAA >90 11/27/2011 0747    No intake or output data in the 24 hours ending 11/27/11 1338   Diet Order:  None  Supplements/Tube Feeding: None  IVF:    sodium chloride Last Rate: 500 mL/hr  at 11/27/11 1037  sodium chloride Last Rate: 75 mL/hr at 11/27/11 1308    Estimated Nutritional Needs:   Kcal:1475-1600 kcal Protein:80-92 g Fluid:1.6 L  NUTRITION DIAGNOSIS: -Inadequate oral intake (NI-2.1).  Status: Ongoing  RELATED TO: advanced dementia  AS EVIDENCE BY: NPO status  MONITORING/EVALUATION(Goals): Patient will meet 90-100% of estimated nutrition needs.   Monitor: SLP assessment, diet advancement, weight trends  EDUCATION NEEDS: -No education needs identified at this time  INTERVENTION: 1. Advance diet per SLP recommendations.  2. Patient may benefit from nutrition supplement due to cachectic appearance and pressure ulcer. RD will monitor PO intake when diet advanced and add nutrition interventions as needed.   DOCUMENTATION CODES Per approved criteria  -Not Applicable    Andrea Koch 11/27/2011, 1:37 PM

## 2011-11-27 NOTE — ED Notes (Signed)
Home - son called b/c c/o cp but? When ems arrived pt. Not c/o cp and sbp 84 and now sbp 135. "she doesn't seem right." hx. Afib.

## 2011-11-27 NOTE — ED Provider Notes (Signed)
History     CSN: [id number]  Arrival date & time 11/27/11  0720   First MD Initiated Contact with Patient 11/27/11 [phone]      Chief Complaint  Patient presents with  . Altered Mental Status    (Consider location/radiation/quality/duration/timing/severity/associated sxs/prior treatment) Patient is a 78 y.o. female presenting with altered mental status. The history is provided by the EMS personnel. The history is limited by the condition of the patient.  Altered Mental Status This is a new problem. The current episode started 12 to 24 hours ago. The problem occurs constantly. The problem has not changed since onset.The symptoms are aggravated by nothing. The symptoms are relieved by nothing. She has tried nothing for the symptoms.  family called ems due to ams and possible chest pain--h/o dementia--family not here at this time  Past Medical History  Diagnosis Date  . Dementia   . HTN (hypertension)   . Atrial fibrillation     paroxysmal  . Tachycardia-bradycardia syndrome     s/p PPM by Dr Deborah Chalk  . GI bleed 2/12    ulcer to ASA  . Diastolic dysfunction   . CHF (congestive heart failure)     Past Surgical History  Procedure Date  . Pacemaker insertion 09/02/09    by Dr Deborah Chalk due to tachycardia bradycardia syndrome and syncope (MDT)    History reviewed. No pertinent family history.  History  Substance Use Topics  . Smoking status: Never Smoker   . Smokeless tobacco: Not on file  . Alcohol Use: No    OB History    Grav Para Term Preterm Abortions TAB SAB Ect Mult Living                  Review of Systems  Unable to perform ROS Psychiatric/Behavioral: Positive for altered mental status.    Allergies  Sulfonamide derivatives  Home Medications   Current Outpatient Rx  Name Route Sig Dispense Refill  . CLONAZEPAM 0.5 MG PO TABS  1/2 to 1 tab po prn     . DIGOXIN 0.125 MG PO TABS Oral Take 1 tablet (125 mcg total) by mouth daily. 30 tablet 5  . FUROSEMIDE  20 MG PO TABS Oral Take 20 mg by mouth 2 (two) times daily.      Marland Kitchen METOPROLOL TARTRATE 25 MG PO TABS  1/2 tab po qd if lower then 110 hold medication      BP 114/49  Pulse 62  Temp(Src) 95.5 F (35.3 C) (Rectal)  Resp 18  SpO2 98%  Physical Exam  Nursing note and vitals reviewed. Constitutional: She appears well-nourished. She appears lethargic. She appears cachectic. She appears toxic. She has a sickly appearance. She appears ill. No distress.  HENT:  Head: Normocephalic and atraumatic.       Dry mucus membranes  Eyes: Conjunctivae, EOM and lids are normal. Pupils are equal, round, and reactive to light.  Neck: Normal range of motion. Neck supple. No tracheal deviation present. No mass present.  Cardiovascular: Normal rate, regular rhythm and normal heart sounds.  Exam reveals no gallop.   No murmur heard. Pulmonary/Chest: Effort normal and breath sounds normal. No stridor. No respiratory distress. She has no decreased breath sounds. She has no wheezes. She has no rhonchi. She has no rales.  Abdominal: Soft. Normal appearance and bowel sounds are normal. She exhibits no distension. There is no tenderness. There is no rebound and no CVA tenderness.  Musculoskeletal: Normal range of motion. She exhibits no edema  and no tenderness.  Neurological: She appears lethargic.       Opens eyes to voice  Skin: Skin is warm and dry. No abrasion and no rash noted.       Skin breakdown at gluteal fold, bilateral le redness  Psychiatric: She is slowed.    ED Course  Procedures (including critical care time)   Labs Reviewed  CBC  DIFFERENTIAL  COMPREHENSIVE METABOLIC PANEL  URINALYSIS, ROUTINE W REFLEX MICROSCOPIC  URINE CULTURE  LACTIC ACID, PLASMA  DIGOXIN LEVEL   No results found.   No diagnosis found.    MDM   Date: 11/27/2011  Rate: 60  Rhythm: sinus bradycardia  QRS Axis: normal  Intervals: normal  ST/T Wave abnormalities: ST depressions inferiorly  Conduction  Disutrbances:none  Narrative Interpretation:   Old EKG Reviewed: changes noted  9:40 AM Patient placed on a warming blanket. Given IV fluids for her blood pressure. We assessed and remained stable. She'll be admitted to the triad hospitalist service  CRITICAL CARE Performed by: Toy Baker   Total critical care time: 50  Critical care time was exclusive of separately billable procedures and treating other patients.  Critical care was necessary to treat or prevent imminent or life-threatening deterioration.  Critical care was time spent personally by me on the following activities: development of treatment plan with patient and/or surrogate as well as nursing, discussions with consultants, evaluation of patient's response to treatment, examination of patient, obtaining history from patient or surrogate, ordering and performing treatments and interventions, ordering and review of laboratory studies, ordering and review of radiographic studies, pulse oximetry and re-evaluation of patient's condition.         Toy Baker, MD 11/27/11 [phone]

## 2011-11-27 NOTE — ED Notes (Signed)
Pt still not back from U/S. Will go to floor upon return to ED

## 2011-11-27 NOTE — ED Notes (Addendum)
[place] EMT and I cleaned pt up got rectal temp and I placed a temp foley in pt. 14 FRENCH Britney got pts CBG which was 77. 8:06AM JG.

## 2011-11-27 NOTE — ED Notes (Signed)
CBG- 77 

## 2011-11-27 NOTE — ED Notes (Signed)
Bed request changed to stepdown per Dr Thedore Mins

## 2011-11-27 NOTE — Procedures (Signed)
Preprocedure diagnosis: Nasolacrimal sac occusion OS. with dacryocystitis.  Procedure: Nasolacrimal duct Probing and irrigation with topical anesthetic.  Surgeon surgeon: Aura Camps M.D.  Indications for procedure: Dacryocystitis left eye.  Description of procedure:  Informed consent was obtained from the patient's relative by telephone.  The left nasolacrimal area including the lids and the  canaliculus was prepped and draped in a sterile fashion. Next a 23-gauge cannula attached to a syringe containing  saline was introduced via the left puncta and traversing a proximal obstruction , continued  to the nasal bone and rotated into the nasolacrimal sac: past the nasolacrimal sac obstruction and then into the nasopharynx .While probing was performed compression was applied to the nasolacrimal sac .   The system was irrigated and the cannula was removed pressure was applied to the nasolacrimal sac decompressing the cyst. At the conclusion of the procedure antibiotic drops were applied to the left nasolacrimal systemand TobraDex ointment was applied.  There were no apparent complications.

## 2011-11-28 ENCOUNTER — Inpatient Hospital Stay (HOSPITAL_COMMUNITY): Payer: Medicare Other

## 2011-11-28 ENCOUNTER — Encounter (HOSPITAL_COMMUNITY): Payer: Self-pay | Admitting: Nurse Practitioner

## 2011-11-28 DIAGNOSIS — K819 Cholecystitis, unspecified: Secondary | ICD-10-CM

## 2011-11-28 LAB — HEPATIC FUNCTION PANEL
AST: 61 U/L — ABNORMAL HIGH (ref 0–37)
Bilirubin, Direct: 0.1 mg/dL (ref 0.0–0.3)
Total Bilirubin: 0.5 mg/dL (ref 0.3–1.2)

## 2011-11-28 LAB — BASIC METABOLIC PANEL
Chloride: 93 mEq/L — ABNORMAL LOW (ref 96–112)
GFR calc Af Amer: >90 mL/min (ref >90)
GFR calc non Af Amer: >90 mL/min (ref >90)
Potassium: 3.9 mEq/L (ref 3.5–5.1)
Sodium: 124 mEq/L — ABNORMAL LOW (ref 135–145)

## 2011-11-28 LAB — CBC
Hemoglobin: 10.1 g/dL — ABNORMAL LOW (ref 12.0–15.0)
RBC: 3.23 MIL/uL — ABNORMAL LOW (ref 3.87–5.11)
WBC: 10.1 10*3/uL (ref 4.0–10.5)

## 2011-11-28 LAB — CARDIAC PANEL(CRET KIN+CKTOT+MB+TROPI)
CK, MB: 4.6 ng/mL — ABNORMAL HIGH (ref 0.3–4.0)
Troponin I: <0.3 ng/mL (ref <0.30)

## 2011-11-28 LAB — T4, FREE: Free T4: 0.72 ng/dL — ABNORMAL LOW (ref 0.80–1.80)

## 2011-11-28 MED ORDER — SODIUM CHLORIDE 0.9 % IV SOLN
INTRAVENOUS | Status: AC
  Administered 2011-11-28 – 2011-11-29 (×2): via INTRAVENOUS

## 2011-11-28 MED ORDER — DIGOXIN 0.25 MG/ML IJ SOLN
0.1250 mg | Freq: Every day | INTRAMUSCULAR | Status: DC
  Administered 2011-11-28 – 2011-12-02 (×5): 0.125 mg via INTRAVENOUS
  Filled 2011-11-28 (×6): qty 0.5

## 2011-11-28 MED ORDER — SODIUM CHLORIDE 0.9 % IV BOLUS (SEPSIS)
500.0000 mL | Freq: Once | INTRAVENOUS | Status: AC
  Administered 2011-11-28: 500 mL via INTRAVENOUS

## 2011-11-28 MED ORDER — SODIUM CHLORIDE 0.9 % IV BOLUS (SEPSIS)
500.0000 mL | Freq: Once | INTRAVENOUS | Status: AC
  Administered 2011-11-28: via INTRAVENOUS

## 2011-11-28 MED ORDER — TECHNETIUM TC 99M MEBROFENIN IV KIT
5.0000 | PACK | Freq: Once | INTRAVENOUS | Status: AC | PRN
  Administered 2011-11-28: 5 via INTRAVENOUS

## 2011-11-28 MED ORDER — PANTOPRAZOLE SODIUM 40 MG IV SOLR
40.0000 mg | Freq: Two times a day (BID) | INTRAVENOUS | Status: DC
Start: 2011-11-28 — End: 2011-11-30
  Administered 2011-11-28 – 2011-11-30 (×5): 40 mg via INTRAVENOUS
  Filled 2011-11-28 (×8): qty 40

## 2011-11-28 NOTE — Consult Note (Signed)
  This patient is not a surgical candidate regardless of what her HIDA scan shows.  If she becomes septic then she needs a percutaneous drain of her gallbladder.  Otherwise I would just treat her with antibiotics if her HIDA is positive.  She has no abdominal pain on examination.  Marta Lamas. Gae Bon, MD, FACS [phone] [facility]

## 2011-11-28 NOTE — Progress Notes (Signed)
Nursing Note:  Spoke with son today about his mother's baseline at home.  He says she was diagnosed with dementia about a year ago and has declined rapidly.  She was able to care for herself, but now lays in bed and refuses to eat or even drink.  She communicates very little and sleeps often.  He has not been able to lift her since a recent hernia surgery and wants her to be placed in a SNF at discharge to get full-time care. 

## 2011-11-28 NOTE — Progress Notes (Signed)
Andrea Koch ZOX:[id number],[id number] is a 78 y.o. female,  Outpatient Primary MD for the patient is Provider Not In System  Chief Complaint  Patient presents with  . Altered Mental Status        Subjective:   Andrea Koch today is in bed, again appears cachetic, unable to provide reliable ROS.  Objective:   Filed Vitals:   11/28/11 0213 11/28/11 0330 11/28/11 0430 11/28/11 0500  BP: 92/44 93/41 101/67   Pulse: 59 61    Temp:  97.7 F (36.5 C)    TempSrc:  Axillary    Resp: 19 18    Height:      Weight:    52.3 kg (115 lb 4.8 oz)  SpO2: 100% 100%      Wt Readings from Last 3 Encounters:  11/28/11 52.3 kg (115 lb 4.8 oz)  02/22/11 54.432 kg (120 lb)     Intake/Output Summary (Last 24 hours) at 11/28/11 0735 Last data filed at 11/28/11 0300  Gross per 24 hour  Intake    525 ml  Output    875 ml  Net   -350 ml    Exam Awake  No new F.N deficits, Normal affect [place].AT,L eye lacrimal duct skin  Inflammation better Supple Neck,No JVD, No cervical lymphadenopathy appriciated.  Symmetrical Chest wall movement, Good air movement bilaterally, CTAB RRR,No Gallops,Rubs or new Murmurs, No Parasternal Heave +ve B.Sounds, Abd Soft, Non tender, No organomegaly appriciated, No rebound -guarding or rigidity. No Cyanosis, Clubbing or edema, multiple old skin tears and bruises     Data Review  CBC  Lab 11/28/11 0423 11/27/11 0747  WBC 10.1 4.2  HGB 10.1* 10.2*  HCT 28.6* 28.9*  PLT 153 193  MCV 88.5 87.6  MCH 31.3 30.9  MCHC 35.3 35.3  RDW 14.1 13.8  LYMPHSABS -- 1.5  MONOABS -- 0.3  EOSABS -- 0.1  BASOSABS -- 0.0  BANDABS -- --    Chemistries   Lab 11/28/11 0423 11/27/11 0747  NA 124* 124*  K 3.9 4.2  CL 93* 88*  CO2 23 26  GLUCOSE 85 74  BUN 14 17  CREATININE 0.41* 0.36*  CALCIUM 8.2* 8.3*  MG -- --  AST -- 79*  ALT -- 98*  ALKPHOS -- 217*  BILITOT -- 0.4    ------------------------------------------------------------------------------------------------------------------ estimated creatinine clearance is 48.6 ml/min (by C-G formula based on Cr of 0.41). ------------------------------------------------------------------------------------------------------------------ No results found for this basename: HGBA1C:2 in the last 72 hours ------------------------------------------------------------------------------------------------------------------ No results found for this basename: CHOL:2,HDL:2,LDLCALC:2,TRIG:2,CHOLHDL:2,LDLDIRECT:2 in the last 72 hours ------------------------------------------------------------------------------------------------------------------  [facility] 11/27/11 1350 11/27/11 0940  TSH 11.780* --  T4TOTAL -- 8.4  T3FREE -- --  THYROIDAB -- --   ------------------------------------------------------------------------------------------------------------------  Alvira Philips 11/27/11 1058  VITAMINB12 556  FOLATE 6.7  FERRITIN 333*  TIBC 232*  IRON 78  RETICCTPCT 0.8    Coagulation profile No results found for this basename: INR:5,PROTIME:5 in the last 168 hours  No results found for this basename: DDIMER:2 in the last 72 hours  Cardiac Enzymes  Lab 11/28/11 0425 11/27/11 1742 11/27/11 0747  CKMB 4.6* 7.1* 7.9*  TROPONINI <0.30 <0.30 <0.30  MYOGLOBIN -- -- --   ------------------------------------------------------------------------------------------------------------------ No components found with this basename: POCBNP:3  Micro Results Recent Results (from the past 240 hour(s))  MRSA PCR SCREENING     Status: Normal   Collection Time   11/27/11  1:16 PM      Component Value Range Status Comment   MRSA by PCR NEGATIVE  NEGATIVE  Final     Radiology Reports Dg Chest 1 View  11/27/2011  *RADIOLOGY REPORT*  Clinical Data: Dimension, CHF, atrial fibrillation, elevated blood pressure  CHEST - 1 VIEW   Comparison: June 17, 2010  Findings: The cardiac silhouette, mediastinum, pulmonary vasculature are within normal limits.  Pacemaker leads are intact and stable, in good position.  Both lungs are clear.  IMPRESSION: Stable chest x-ray with no evidence of acute cardiac or pulmonary process.  Original Report Authenticated By: Brandon Melnick, M.D.   Ct Head Wo Contrast  11/27/2011  *RADIOLOGY REPORT*  Clinical Data: Mental status change  CT HEAD WITHOUT CONTRAST  Technique:  Contiguous axial images were obtained from the base of the skull through the vertex without contrast.  Comparison: 08/05/2011  Findings: There is diffuse patchy low density throughout the subcortical and periventricular white matter consistent with chronic small vessel ischemic change.  There is prominence of the sulci and ventricles consistent with brain atrophy.  There is no evidence for acute brain infarct, hemorrhage or mass.  The mastoid air cells and paranasal sinuses are clear.  The skull appears intact.  IMPRESSION:  1.  Small vessel ischemic change and brain atrophy. 2.  No acute intracranial abnormalities.  Original Report Authenticated By: Rosealee Albee, M.D.   US Abdomen Complete  11/27/2011  *RADIOLOGY REPORT*  Clinical Data:  Unresponsive, elevated LFTs  COMPLETE ABDOMINAL ULTRASOUND  Comparison:  None.  Findings:  Gallbladder:  Is distended with a striated thickened wall measuring up to 4 mm in thickness.  There is layering sludge in the lumen. Equivocal sonographic Murphy's sign.  There is a trace amount of pericholecystic fluid.  Common bile duct:  4 mm diameter, unremarkable.  Liver:  No focal lesion identified.  Within normal limits in parenchymal echogenicity.  IVC:  Appears normal.  Pancreas:  Largely obscured by overlying bowel gas.  Spleen:  8.4 cm craniocaudal length, unremarkable  Right Kidney:  9.7 cm, isoechoic to the adjacent liver.  No focal lesion or hydronephrosis.  Left Kidney:  8.3 cm. No hydronephrosis.   Well-preserved cortex. Normal size and parenchymal echotexture without focal abnormalities.  Abdominal aorta:  Atheromatous calcifications without aneurysm.  Note made of a right pleural effusion.  IMPRESSION:  1. Distended gallbladder with thickened   wall and pericholecystic fluid, suggestive of cholecystitis.  If there is continued clinical uncertainty, hepatobiliary scintigraphy may be useful. 2.  Small right pleural effusion.  Original Report Authenticated By: Osa Craver, M.D.    Scheduled Meds:   . ciprofloxacin  2 drop Left Eye BID  . digoxin  125 mcg Oral Daily  . magnesium sulfate 1 - 4 g bolus IVPB  2 g Intravenous Once  . piperacillin-tazobactam (ZOSYN)  IV  3.375 g Intravenous Q8H  . sodium chloride  500 mL Intravenous Once  . sodium chloride  500 mL Intravenous Once  . sodium chloride  500 mL Intravenous Once  . sodium chloride  3 mL Intravenous Q12H  . DISCONTD: sodium chloride   Intravenous Once   Continuous Infusions:   . sodium chloride 500 mL/hr at 11/27/11 1037  . sodium chloride 75 mL/hr at 11/27/11 1854  . sodium chloride     PRN Meds:.albuterol, guaiFENesin-dextromethorphan, HYDROcodone-acetaminophen, ondansetron (ZOFRAN) IV, ondansetron  Assessment & Plan    1.Hypothermia - resolved post Warming blankets.     2. Dehydration - Hyponatremia, cachexia, poor personal hygiene, dehydration - will bolus 1 lit now, then continue maintenance  IV fluids, Ur  sodium under 40, also Osm levels suggested combined SIADH, but in the presence of Hypotension needs fluids, will monitor.     3. History of Atrial fibrillation, Sinoatrial node dysfunction, Tachycardia-bradycardia syndrome, chronic Diastolic dysfunction - currently in NSR, for now digoxin will be continued, pharm to switch to IV as PO status ??, tele monitor, Dig level <1, pt is status post pacemaker in the past.     4. T wave depression on EKG, patient denies any chest pain although review of  systems is unreliable, cardiac enzymes are negative x 3 sets, beta blocker will be initiated once blood pressure is better, she is allergic to aspirin.      5.Dacryocystitis of left lacrimal gland - post bedside Nasolacrimal duct Probing and irrigation with topical anesthetic by Dr Karleen Hampshire on 11-28-11, continue Cipro eye drops, looks better.    6.Sacral decubitus ulcer, stage III-old care consultation and Zosyn to be continued.      9. Elevated liver enzymes unclear etiology- ? Cholecystitis on right upper quadrant ultrasound- pt unable to give any H/O pain, ?  right upper quadrant of the abdomen does not appear to be tender. stable lipase ,TB and Lactate . D/W Surgeon Dr Lindie Spruce - who will see the pt shortly, have ordered HIDA for now, may need GB drain, poor operative candidate. NPO except Meds for now, Hep panel- repeat, continue Zosyn.     9. Anemia of chronic disease will monitor likely to fall after hydration, stable anemia panel.    10. TSH elevated, will check free T4.    DVT Prophylaxis    SCDs      Leroy Sea M.D on 11/28/2011 at 7:35 AM  [facility] Office  [phone]

## 2011-11-28 NOTE — Progress Notes (Signed)
Pacemaker sometimes fails to capture.  Son says he was not able to get his mother to the Cardiologist's office in December for her scheduled checkup.  Dr. Maylon Cos placed the pacemaker 2 years ago.

## 2011-11-28 NOTE — Evaluation (Signed)
Clinical/Bedside Swallow Evaluation Patient Details  Name: Andrea Koch MRN: [id number] DOB: 04-24-1932 Today's Date: 11/28/2011  Past Medical History:  Past Medical History  Diagnosis Date  . Dementia   . HTN (hypertension)   . Atrial fibrillation     paroxysmal  . Tachycardia-bradycardia syndrome     s/p PPM by Dr Deborah Chalk  . GI bleed 2/12    ulcer to ASA  . Diastolic dysfunction   . CHF (congestive heart failure)   . Pacemaker   . Coronary artery disease    Past Surgical History:  Past Surgical History  Procedure Date  . Pacemaker insertion 09/02/09    by Dr Deborah Chalk due to tachycardia bradycardia syndrome and syncope (MDT)   HPI:  78 y/o female admitted to ED from home due to high blood pressure. Per medical report upon arrival to ED patient was noted to be cahectic, hypothermic, hypotensive, tachycardic hyponatremic wtih mulitple skin tears, left lacrimal gland infection, cellulitis in sacral region .  Patient NPO this am for  Hepatobiliary Imaging to evaluate cholecystitis as questioned from ultrasound.  MD confirmed to proceed with BSE and initiate appropriate diet this date when contacted by treating SLP.    Assessment/Recommendations/Treatment Plan    SLP Assessment Clinical Impression Statement: Moderate oral phase dysphagia marked by poor bolus awareness with delay in oral transit with all PO trials.  Minimal pharyngeal dysphagia characterized by delay in initiation with +s/s of aspiration s/p swallow of thin liquid by cup.  Patient noted with intermittent LOA but was arousable  for short periods of time.  Recommend to proceed with conservative diet of dysphagia 1 with nectar thick liquids with full supervision secondary to cognitive deficits.  ST to follow in acute care setting for diet tolerance and possible diet advancement.  Risk for Aspiration: Moderate Other Related Risk Factors: Lethargy;Cognitive impairment  Swallow Evaluation Recommendations Diet  Recommendations: Dysphagia 1 (Puree);Nectar-thick liquid Liquid Administration via: Cup Medication Administration: Crushed with puree Supervision: Full supervision/cueing for compensatory strategies Compensations: Slow rate;Small sips/bites;Check for pocketing Postural Changes and/or Swallow Maneuvers: Seated upright 90 degrees;Upright 30-60 min after meal Oral Care Recommendations: Oral care before and after PO Other Recommendations: Order thickener from pharmacy;Clarify dietary restrictions;Prohibited food (jello, ice cream, thin soups);Remove water pitcher;Have oral suction available Follow up Recommendations: Skilled Nursing facility  Treatment Plan Speech Therapy Frequency: min 2x/week Treatment Duration: 2 weeks Interventions: Trials of upgraded texture/liquids;Diet toleration management by SLP;Patient/family education  Prognosis Prognosis for Safe Diet Advancement: Good  Individuals Consulted Consulted and Agree with Results and Recommendations: RN;Patient unable/family or caregiver not available  Swallowing Goals  SLP Swallowing Goals Patient will consume recommended diet without observed clinical signs of aspiration with: Moderate assistance Patient will utilize recommended strategies during swallow to increase swallowing safety with: Moderate assistance  Swallow Study Prior Functional Status     General  Date of Onset: 11/27/11 HPI: 78 y/o female admitted to ED from home due to high blood pressure. Per medical report upon arrival to ED patient was noted to be cahectic, hypothermic, hypotensive, tachycardic hyponatremic wtih mulitple skin tears, left lacrimal gland infection, cellulitis in sacral region .  Patient NPO this am for  Hepatobiliary Imaging to evaluate cholecystitis as questioned from ultrasound.   Type of Study: Bedside swallow evaluation Diet Prior to this Study: NPO Temperature Spikes Noted: No History of Intubation: No Behavior/Cognition:  Lethargic;Cooperative;Confused;Requires cueing;Distractible Oral Cavity - Dentition: Missing dentition;Poor condition Vision: Impaired for self-feeding Patient Positioning: Upright in bed Baseline Vocal Quality: Clear Volitional  Cough: Weak Volitional Swallow: Unable to elicit  Oral Motor/Sensory Function  Overall Oral Motor/Sensory Function: Appears within functional limits for tasks assessed  Consistency Results  Ice Chips Ice chips: Impaired Presentation: Spoon Oral Phase Impairments: Impaired anterior to posterior transit Pharyngeal Phase Impairments: Delayed Swallow;Cough - Immediate  Thin Liquid Thin Liquid: Impaired Presentation: Cup;Spoon Oral Phase Impairments: Impaired anterior to posterior transit Oral Phase Functional Implications: Oral holding;Prolonged oral transit Pharyngeal  Phase Impairments: Delayed Swallow;Decreased hyoid-laryngeal movement;Cough - Immediate  Nectar Thick Liquid Nectar Thick Liquid: Impaired Presentation: Cup Pharyngeal Phase Impairments: Delayed Swallow;Decreased hyoid-laryngeal movement  Honey Thick Liquid Honey Thick Liquid: Not tested  Puree Puree: Impaired Presentation: Spoon Oral Phase Impairments: Impaired anterior to posterior transit;Poor awareness of bolus Oral Phase Functional Implications: Oral holding;Prolonged oral transit Pharyngeal Phase Impairments: Delayed Swallow;Decreased hyoid-laryngeal movement  Solid Solid: Not tested Moreen Fowler M.S., CCC-SLP [phone]  [facility] 11/28/2011,3:23 PM

## 2011-11-28 NOTE — Plan of Care (Signed)
Nursing Note:  Spoke with son today about his mother's baseline at home.  He says she was diagnosed with dementia about a year ago and has declined rapidly.  She was able to care for herself, but now lays in bed and refuses to eat or even drink.  She communicates very little and sleeps often.  He has not been able to lift her since a recent hernia surgery and wants her to be placed in a SNF at discharge to get full-time care.

## 2011-11-28 NOTE — Progress Notes (Signed)
PT Cancellation Note  Treatment cancelled today due to medical issues with patient which prohibited therapy. According to RN, pt is difficult to arouse, is having trouble breathing and is hypotensive. Patient is currently not appropriate for therapy, signing off on patient. Please reorder if necessary.   Thanks, 11/28/2011 Milana Kidney DPT PAGER: [phone] OFFICE: [phone]    Milana Kidney 11/28/2011, 12:41 PM

## 2011-11-29 DIAGNOSIS — E039 Hypothyroidism, unspecified: Secondary | ICD-10-CM | POA: Diagnosis present

## 2011-11-29 LAB — COMPREHENSIVE METABOLIC PANEL
ALT: 69 U/L — ABNORMAL HIGH (ref 0–35)
AST: 53 U/L — ABNORMAL HIGH (ref 0–37)
Albumin: 2.2 g/dL — ABNORMAL LOW (ref 3.5–5.2)
Alkaline Phosphatase: 197 U/L — ABNORMAL HIGH (ref 39–117)
CO2: 22 mEq/L (ref 19–32)
Chloride: 100 mEq/L (ref 96–112)
Creatinine, Ser: 0.42 mg/dL — ABNORMAL LOW (ref 0.50–1.10)
GFR calc non Af Amer: >90 mL/min (ref >90)
Potassium: 3.8 mEq/L (ref 3.5–5.1)
Sodium: 129 mEq/L — ABNORMAL LOW (ref 135–145)
Total Bilirubin: 0.4 mg/dL (ref 0.3–1.2)

## 2011-11-29 LAB — CBC
Platelets: 168 10*3/uL (ref 150–400)
RBC: 3.22 MIL/uL — ABNORMAL LOW (ref 3.87–5.11)
RDW: 14.4 % (ref 11.5–15.5)
WBC: 8.7 10*3/uL (ref 4.0–10.5)

## 2011-11-29 LAB — URINE CULTURE: Culture  Setup Time: 201303301326

## 2011-11-29 MED ORDER — SODIUM CHLORIDE 0.9 % IV BOLUS (SEPSIS)
500.0000 mL | Freq: Once | INTRAVENOUS | Status: AC
  Administered 2011-11-29: 500 mL via INTRAVENOUS

## 2011-11-29 MED ORDER — LEVOTHYROXINE SODIUM 100 MCG IV SOLR
12.5000 ug | Freq: Every day | INTRAVENOUS | Status: DC
  Administered 2011-11-29: 12.5 ug via INTRAVENOUS
  Administered 2011-11-30: 11:00:00 via INTRAVENOUS
  Filled 2011-11-29 (×2): qty 0.63

## 2011-11-29 MED ORDER — ENSURE PUDDING PO PUDG
1.0000 | Freq: Three times a day (TID) | ORAL | Status: DC
  Administered 2011-11-29 – 2011-12-04 (×10): 1 via ORAL

## 2011-11-29 MED ORDER — SODIUM CHLORIDE 0.9 % IJ SOLN
INTRAMUSCULAR | Status: AC
  Filled 2011-11-29: qty 10

## 2011-11-29 NOTE — Consult Note (Signed)
WOC consult Note Reason for Consult: eval. Wound of sacrum.  Pt at home with care from family, however noted she is bed bound for some time.   Wound type: Pressure ulcers x 2 Stage III, full thickness skin loss Pressure Ulcer POA: Yes Measurement: 3.5cm x 2.0cm x 0.2cm and 1.5cm x 1.0cm x 0.5cm Wound bed: pink and moist, minimal slough noted in proximal wound Drainage (amount, consistency, odor) minimal Periwound: intact without problems Dressing procedure/placement/frequency: will continue silicone foam dressings to sacral pressure ulcers, to be changed every 3 days and PRN soilage.  Pt needs air overlay if dc to home or to SNF.  On support surface in ICU at current time for pressure redistribution.  Re consult if needed, will not follow at this time. Thanks  Jaime Dome [employer], CWOCN [phone])

## 2011-11-29 NOTE — Progress Notes (Signed)
Utilization Review Completed.  Andrea Koch T  11/29/2011  

## 2011-11-29 NOTE — Progress Notes (Signed)
Occupational Therapy Discharge Note  Order received, chart reviewed, noted from notes that pt's son reports that pt has been diagnosed with dementia in the last year and has gone down hill fast. That she has not been able to take care of herself, often stays in the bed, and is not drinking or feeding herself. Do not feel that skilled OT will benefit pt since she will not be able to learn skills. Acute OT signing off.  Ignacia Palma, [place] [phone] 11/29/2011

## 2011-11-29 NOTE — Progress Notes (Signed)
Speech Language Pathology Dysphagia Treatment  Patient Details Name: Andrea Koch MRN: [id number] DOB: [birth year] Today's Date: 11/29/2011  SLP Assessment/Plan/Recommendation Assessment / Recommendations / Plan Clinical Impression: Pt with persisting lethargy; arousable to tactile/verbal cues.  Accepted minimal POs - ice chips and yogurt - with cueing required to maintain wakefulness.  Occasional single word responses. Delay in swallow initiation likely; no overt sympoms of aspiration but not sufficiently arousable to eat more than 4-5 boluses of puree.  RR fluctuating between 4-14.  Feeding discontinued. Continue with Current Diet: Dysphagia 1 (puree);Nectar-thick liquid pending improved alertness Medication Administration: Crushed with puree Supervision: Full supervision/cueing for compensatory strategies Compensations: Slow rate;Small sips/bites;Check for pocketing Postural Changes and/or Swallow Maneuvers: Seated upright 90 degrees;Upright 30-60 min after meal Oral Care Recommendations: Oral care before and after PO Plan: Continue with current plan of care Swallowing Goals not met    General Temperature Spikes Noted: No Respiratory Status: Supplemental O2 delivered via (comment) (2l [place]) Behavior/Cognition: Lethargic Oral Cavity - Dentition: Missing dentition Patient Positioning: Upright in bed  Oral Cavity - Oral Hygiene:  Oral care provided by SLP prior to session     Dysphagia Treatment Treatment focused on: Skilled observation of diet tolerance;Facilitation of oral preparatory phase Treatment Methods/Modalities: Differential diagnosis;Skilled observation Patient observed directly with PO's: Yes Type of PO's observed: Ice chips;Dysphagia 1 (puree) Feeding: Total assist Liquids provided via: Teaspoon Oral Phase Signs & Symptoms: Prolonged oral phase Pharyngeal Phase Signs & Symptoms: Suspected delayed swallow initiation;Changes in respirations (respirations fluctuating  between 4 and 14) Type of cueing: Tactile;Verbal Amount of cueing: Maximal   Andrea Koch Andrea Koch Andrea Koch, [place] CCC/SLP Pager [phone]  Andrea Koch Andrea Koch 11/29/2011, 10:00 AM

## 2011-11-29 NOTE — Progress Notes (Signed)
Subjective: Pleasantly confused.  Nodded no to pain.  Objective: Vital signs in last 24 hours: Filed Vitals:   11/29/11 1300 11/29/11 1400 11/29/11 1500 11/29/11 1600  BP: 94/46 85/49 101/72   Pulse: 64 60 73   Temp:    97.8 F (36.6 C)  TempSrc:    Oral  Resp: 15 14 18    Height:      Weight:      SpO2: 98% 100% 100%    Weight change: 0.9 kg (1 lb 15.8 oz)  Intake/Output Summary (Last 24 hours) at 11/29/11 1737 Last data filed at 11/29/11 1600  Gross per 24 hour  Intake   1410 ml  Output    802 ml  Net    608 ml    Physical Exam: General: Awake, No acute distress. HEENT: EOMI. Neck: Supple CV: S1 and S2 Lungs: Clear to ascultation bilaterally Abdomen: Soft, Nontender, Nondistended, +bowel sounds. Ext: Good pulses. Trace edema.  Lab Results:  Basename 11/29/11 0400 11/28/11 0423  NA 129* 124*  K 3.8 3.9  CL 100 93*  CO2 22 23  GLUCOSE 53* 85  BUN 12 14  CREATININE 0.42* 0.41*  CALCIUM 8.1* 8.2*  MG -- --  PHOS -- --    Basename 11/29/11 0400 11/28/11 0606  AST 53* 61*  ALT 69* 79*  ALKPHOS 197* 196*  BILITOT 0.4 0.5  PROT 4.6* 4.9*  ALBUMIN 2.2* 2.3*    Basename 11/27/11 1058  LIPASE 18  AMYLASE --    Basename 11/29/11 0400 11/28/11 0423 11/27/11 0747  WBC 8.7 10.1 --  NEUTROABS -- -- 2.3  HGB 10.0* 10.1* --  HCT 28.5* 28.6* --  MCV 88.5 88.5 --  PLT 168 153 --    Basename 11/28/11 0425 11/27/11 1742 11/27/11 0747  CKTOTAL 82 117 131  CKMB 4.6* 7.1* 7.9*  CKMBINDEX -- -- --  TROPONINI <0.30 <0.30 <0.30   No components found with this basename: POCBNP:3 No results found for this basename: DDIMER:2 in the last 72 hours No results found for this basename: HGBA1C:2 in the last 72 hours No results found for this basename: CHOL:2,HDL:2,LDLCALC:2,TRIG:2,CHOLHDL:2,LDLDIRECT:2 in the last 72 hours  Basename 11/27/11 1350 11/27/11 0940  TSH 11.780* --  T4TOTAL -- 8.4  T3FREE -- --  THYROIDAB -- --    Basename 11/27/11 1058  VITAMINB12  556  FOLATE 6.7  FERRITIN 333*  TIBC 232*  IRON 78  RETICCTPCT 0.8    Micro Results: Recent Results (from the past 240 hour(s))  URINE CULTURE     Status: Normal   Collection Time   11/27/11  8:03 AM      Component Value Range Status Comment   Specimen Description URINE, CATHETERIZED   Final    Special Requests NONE   Final    Culture  Setup Time [id number]   Final    Colony Count 9,000 COLONIES/ML   Final    Culture ESCHERICHIA COLI   Final    Report Status 11/29/2011 FINAL   Final    Organism ID, Bacteria ESCHERICHIA COLI   Final   CULTURE, BLOOD (ROUTINE X 2)     Status: Normal (Preliminary result)   Collection Time   11/27/11  9:25 AM      Component Value Range Status Comment   Specimen Description BLOOD RIGHT ARM   Final    Special Requests BOTTLES DRAWN AEROBIC AND ANAEROBIC 10CC   Final    Culture  Setup Time [id number]   Final  Culture     Final    Value:        BLOOD CULTURE RECEIVED NO GROWTH TO DATE CULTURE WILL BE HELD FOR 5 DAYS BEFORE ISSUING Koch FINAL NEGATIVE REPORT   Report Status PENDING   Incomplete   CULTURE, BLOOD (ROUTINE X 2)     Status: Normal (Preliminary result)   Collection Time   11/27/11  9:52 AM      Component Value Range Status Comment   Specimen Description BLOOD RIGHT ARM   Final    Special Requests BOTTLES DRAWN AEROBIC AND ANAEROBIC 10CC   Final    Culture  Setup Time [id number]   Final    Culture     Final    Value:        BLOOD CULTURE RECEIVED NO GROWTH TO DATE CULTURE WILL BE HELD FOR 5 DAYS BEFORE ISSUING Koch FINAL NEGATIVE REPORT   Report Status PENDING   Incomplete   MRSA PCR SCREENING     Status: Normal   Collection Time   11/27/11  1:16 PM      Component Value Range Status Comment   MRSA by PCR NEGATIVE  NEGATIVE  Final     Studies/Results: Nm Hepatobiliary Liver Func  11/28/2011  *RADIOLOGY REPORT*  Clinical Data:  Evaluate cholecystitis as questioned on ultrasound  NUCLEAR MEDICINE HEPATOBILIARY IMAGING  Technique:   Sequential images of the abdomen were obtained out to 60 minutes following intravenous administration of radiopharmaceutical.  Radiopharmaceutical:  Tc-6m Choletec  Comparison:  None.  Findings:   Following the intravenous administration of the radiopharmaceutical there is uniform uptake by the liver with clearance from blood pool.  Radiotracer accumulation within the gallbladder is identified by 20 minutes.  Radiotracer activity within the common bile duct and small bowel is noted within 10 minutes.  IMPRESSION:  1.  Patent cystic duct without evidence for acute cholecystitis.  Original Report Authenticated By: Rosealee Albee, M.D.    Medications: I have reviewed the patient's current medications. Scheduled Meds:   . ciprofloxacin  2 drop Left Eye BID  . digoxin  0.125 mg Intravenous Daily  . levothyroxine  12.5 mcg Intravenous Daily  . pantoprazole (PROTONIX) IV  40 mg Intravenous Q12H  . piperacillin-tazobactam (ZOSYN)  IV  3.375 g Intravenous Q8H  . sodium chloride  500 mL Intravenous Once  . sodium chloride  3 mL Intravenous Q12H   Continuous Infusions:   . sodium chloride 100 mL/hr at 11/29/11 0821   PRN Meds:.albuterol, guaiFENesin-dextromethorphan, HYDROcodone-acetaminophen, ondansetron (ZOFRAN) IV, ondansetron  Assessment/Plan: Hypothermia Resolved, used warming blankets.   Dehydration Secondary to severe dementia, and poor by mouth intake.  Hyponatremia Likely due to dehydration improved with IV hydration, improved.  Hold IV fluids for now and reassess.  History of Atrial fibrillation, Sinoatrial node dysfunction, Tachycardia-bradycardia syndrome, chronic Diastolic dysfunction, status post pacemaker placement. Currently the patient is paced.  Continue digoxin.  Per son patient is due for her interrogation of her pacemaker, will need to contact [facility] cardiology in the AM for consideration of interrogating her pacemaker. Cardiac enzymes are negative x 3 sets, beta blocker  will be initiated once blood pressure is better, she is allergic to aspirin.   Dacryocystitis of left lacrimal gland Post bedside Nasolacrimal duct Probing and irrigation with topical anesthetic by Dr Karleen Hampshire on 11/28/11, continue Cipro eye drops, improved.    Dysphasia Dysphasia 1 diet with nectar thick liquids.  Malnutrition/Severe protein calorie malnutrition Ensure pudding.   Sacral decubitus ulcer,  stage III Continue wound care. Continue zosyn.  Escherichia coli urinary tract infection culture growing 9000 colonies Continue Zosyn.  Sensitive to cefazolin, gentamicin, nitrofurantoin, Zosyn, tobramycin, Bactrim.  Elevated liver enzymes etiology unclear HIDA scan on 11/28/2011 shows patent cystic duct without evidence for acute cholecystitis.  Ultrasound on 11/27/2011 showed distended gallbladder with thickened wall and pericholecystic fluid suggestive of cholecystitis. Dr. Lindie Spruce signed off.  Will send for hepatitis panel.  Anemia of chronic disease Hemoglobin stable.  Serum iron 78, TIBC 154, TIBC 232, ferritin 333, panel done on November 27, 2011.  Hypothyroidism TSH elevated, free T4 0.72 (low) start the patient on Synthroid IV 12.5 mg.   Prophylaxis SCDs  CODE STATUS DNR/DNI, discussed with patient's son today and reconfirmed with patient's son.  Discussed with the patient's son about palliative care/hospice, son at this time wants to wait and reassess at skilled nursing facility.  Disposition Continue care in stepdown, consider transitioning the patient has been in 24 hours if patient is stable. PT/OT evaluation pending.   LOS: 2 days  Andrea Riggsbee A, MD 11/29/2011, 5:37 PM

## 2011-11-29 NOTE — Progress Notes (Signed)
Cystic duct is patent on HIDA.  No acute cholecystitis.  We will sign off.  No need for antibiotics.  Marta Lamas. Gae Bon, MD, FACS [phone] Trauma Surgeon

## 2011-11-29 NOTE — Consult Note (Signed)
ANTIBIOTIC CONSULT NOTE - INITIAL  Pharmacy Consult for Zosyn Indication: sacral decubitus ulcer w/ cellulitis  Allergies  Allergen Reactions  . Aspirin Other (See Comments)    Ulcer - bleeding  . Sulfonamide Derivatives    Patient Measurements: Height: 5' 6.93" (170 cm) Weight: 121 lb 14.6 oz (55.3 kg) IBW/kg (Calculated) : 61.44   Vital Signs: Temp: 97.6 F (36.4 C) (04/01 0400) Temp src: Oral (04/01 0400) BP: 102/54 mmHg (04/01 0400) Pulse Rate: 61  (04/01 0400)  Labs:  Basename 11/29/11 0400 11/28/11 0423 11/27/11 0747  WBC 8.7 10.1 4.2  HGB 10.0* 10.1* 10.2*  PLT 168 153 193  LABCREA -- -- --  CREATININE 0.42* 0.41* 0.36*   Estimated Creatinine Clearance: 51.4 ml/min (by C-G formula based on Cr of 0.42).  Microbiology: Recent Results (from the past 720 hour(s))  URINE CULTURE     Status: Normal   Collection Time   11/27/11  8:03 AM      Component Value Range Status Comment   Specimen Description URINE, CATHETERIZED   Final    Special Requests NONE   Final    Culture  Setup Time [id number]   Final    Colony Count 9,000 COLONIES/ML   Final    Culture ESCHERICHIA COLI   Final    Report Status 11/29/2011 FINAL   Final    Organism ID, Bacteria ESCHERICHIA COLI   Final   CULTURE, BLOOD (ROUTINE X 2)     Status: Normal (Preliminary result)   Collection Time   11/27/11  9:25 AM      Component Value Range Status Comment   Specimen Description BLOOD RIGHT ARM   Final    Special Requests BOTTLES DRAWN AEROBIC AND ANAEROBIC 10CC   Final    Culture  Setup Time [id number]   Final    Culture     Final    Value:        BLOOD CULTURE RECEIVED NO GROWTH TO DATE CULTURE WILL BE HELD FOR 5 DAYS BEFORE ISSUING A FINAL NEGATIVE REPORT   Report Status PENDING   Incomplete   CULTURE, BLOOD (ROUTINE X 2)     Status: Normal (Preliminary result)   Collection Time   11/27/11  9:52 AM      Component Value Range Status Comment   Specimen Description BLOOD RIGHT ARM   Final    Special Requests BOTTLES DRAWN AEROBIC AND ANAEROBIC 10CC   Final    Culture  Setup Time [id number]   Final    Culture     Final    Value:        BLOOD CULTURE RECEIVED NO GROWTH TO DATE CULTURE WILL BE HELD FOR 5 DAYS BEFORE ISSUING A FINAL NEGATIVE REPORT   Report Status PENDING   Incomplete   MRSA PCR SCREENING     Status: Normal   Collection Time   11/27/11  1:16 PM      Component Value Range Status Comment   MRSA by PCR NEGATIVE  NEGATIVE  Final     Medical History: Past Medical History  Diagnosis Date  . Dementia   . HTN (hypertension)   . Atrial fibrillation     paroxysmal  . Tachycardia-bradycardia syndrome     s/p PPM by Dr Deborah Chalk  . GI bleed 2/12    ulcer to ASA  . Diastolic dysfunction   . CHF (congestive heart failure)   . Pacemaker   . Coronary artery disease    Assessment: 79yof with  a stage III sacral decubitus ulcer and surrounding cellulitis to On D3 of Zosyn. Renal function has been stable. WBC 8.7. 3/30 urine Cx = E. coli (9K, sensitive = cefazolin, ceftriaxone, Zosyn, aminoglycoside, Bactrim).   Plan:  1) Zosyn 3.375g q8h (4 hr infusion).  2) Pharmacy will sign-off as no dose adjustments anticipated.   Emeline Gins 11/29/2011,10:28 AM

## 2011-11-30 LAB — HEPATITIS PANEL, ACUTE
HCV Ab: NEGATIVE
Hep A IgM: NEGATIVE
Hep B C IgM: NEGATIVE

## 2011-11-30 LAB — CBC
MCH: 31.8 pg (ref 26.0–34.0)
MCHC: 36.1 g/dL — ABNORMAL HIGH (ref 30.0–36.0)
MCV: 88.2 fL (ref 78.0–100.0)
Platelets: 208 10*3/uL (ref 150–400)
RBC: 3.3 MIL/uL — ABNORMAL LOW (ref 3.87–5.11)

## 2011-11-30 LAB — COMPREHENSIVE METABOLIC PANEL
AST: 41 U/L — ABNORMAL HIGH (ref 0–37)
CO2: 22 mEq/L (ref 19–32)
Calcium: 8 mg/dL — ABNORMAL LOW (ref 8.4–10.5)
Creatinine, Ser: 0.45 mg/dL — ABNORMAL LOW (ref 0.50–1.10)
GFR calc Af Amer: >90 mL/min (ref >90)
GFR calc non Af Amer: >90 mL/min (ref >90)
Total Protein: 4.7 g/dL — ABNORMAL LOW (ref 6.0–8.3)

## 2011-11-30 MED ORDER — SODIUM CHLORIDE 0.9 % IV SOLN
Freq: Once | INTRAVENOUS | Status: AC
  Administered 2011-11-30: 07:00:00 via INTRAVENOUS

## 2011-11-30 MED ORDER — LEVOTHYROXINE SODIUM 25 MCG PO TABS
25.0000 ug | ORAL_TABLET | Freq: Every day | ORAL | Status: DC
  Administered 2011-12-01 – 2011-12-04 (×4): 25 ug via ORAL
  Filled 2011-11-30 (×6): qty 1

## 2011-11-30 MED ORDER — ENSURE COMPLETE PO LIQD
237.0000 mL | Freq: Three times a day (TID) | ORAL | Status: DC
  Administered 2011-11-30 – 2011-12-04 (×7): 237 mL via ORAL

## 2011-11-30 NOTE — Progress Notes (Signed)
Nutrition Follow-up/Consult  RD consulted 2/2 "Pt Appears Malnourished." See full nutrition assessment completed on 3/30. BSE completed on 3/31 recommending Dysphagia 1 diet with Nectar Thickened Liquids. Pt consumed 100% Magic Cup for breakfast today, 50% eggs, and 4 oz of orange juice, per SLP note; recommending advancing diet to Dysphagia 2 with thin liquids. This RD spoke with Dr. Butler Denmark, who is agreeable to this diet change and also agreeable to Ensure Complete supplements for additional nutrition.  Per nursing note on 3/31, RN reports new information regarding pt's social situation. Per son, pt was dx with dementia a year ago and has rapidly declined. Lays in bed and refuses to eat in drink while at home.  Pt meets criteria for severe malnutrition in context of acute illness 2/2 <50% of estimated energy requirement for at least 5 days, visible temporal wasting and cachectic appearance signifying moderate body fat and muscle loss.  Noted WOCRN staged wound on sacrum as Stage III.  Diet Order:  Dysphagia 1 with Nectar Liquids Supplement: Ensure Pudding PO TID  Meds: Scheduled Meds:   . sodium chloride   Intravenous Once  . ciprofloxacin  2 drop Left Eye BID  . digoxin  0.125 mg Intravenous Daily  . feeding supplement  1 Container Oral TID BM  . levothyroxine  12.5 mcg Intravenous Daily  . pantoprazole (PROTONIX) IV  40 mg Intravenous Q12H  . piperacillin-tazobactam (ZOSYN)  IV  3.375 g Intravenous Q8H  . sodium chloride  3 mL Intravenous Q12H   Continuous Infusions:  PRN Meds:.albuterol, guaiFENesin-dextromethorphan, HYDROcodone-acetaminophen, ondansetron (ZOFRAN) IV, ondansetron  Labs:  CMP     Component Value Date/Time   NA 128* 11/30/2011 0433   K 4.2 11/30/2011 0433   CL 99 11/30/2011 0433   CO2 22 11/30/2011 0433   GLUCOSE 84 11/30/2011 0433   BUN 20 11/30/2011 0433   CREATININE 0.45* 11/30/2011 0433   CALCIUM 8.0* 11/30/2011 0433   PROT 4.7* 11/30/2011 0433   ALBUMIN 2.2* 11/30/2011 0433    AST 41* 11/30/2011 0433   ALT 61* 11/30/2011 0433   ALKPHOS 175* 11/30/2011 0433   BILITOT 0.4 11/30/2011 0433   GFRNONAA >90 11/30/2011 0433   GFRAA >90 11/30/2011 0433     Intake/Output Summary (Last 24 hours) at 11/30/11 1009 Last data filed at 11/30/11 0800  Gross per 24 hour  Intake     50 ml  Output    452 ml  Net   -402 ml   Wt Readings from Last 20 Encounters:  11/30/11 121 lb 11.1 oz (55.2 kg)  02/22/11 120 lb (54.432 kg)   Weight Status:  55.2 kg, wt up 0.8 kg x 4 days  Estimated needs:  1475 - 1600 kcal, 80 - 92 grams protein  Nutrition Dx:  Inadequate oral intake now r/t advanced dementia AEB variable meal completion.  Goal:  Pt will meet 90 - 100% of estimated nutrition needs. Unmet.  Intervention:    Add Ensure Complete PO TID for additional protein and kcal.   Continue to supplement Magic Cup on trays.  Recommend checking magnesium and phosphorus, as pt may be at risk for refeeding syndrome given new information regarding suboptimal PO intake PTA. Replete prn.  RD to follow nutrition care plan  Monitor:  diet advancement, weight trends  DOCUMENTATION CODES  Per approved criteria   -Severe malnutrition in the context of acute illness or injury    Adair Laundry Pager #:  [phone]

## 2011-11-30 NOTE — Evaluation (Signed)
Physical Therapy Evaluation Patient Details Name: Andrea Koch MRN: [id number] DOB: 1932/04/17 Today's Date: 11/30/2011  Problem List:  Patient Active Problem List  Diagnoses  . Atrial fibrillation  . Sinoatrial node dysfunction  . Hypertension  . Edema  . Dementia  . HTN (hypertension)  . Tachycardia-bradycardia syndrome  . Diastolic dysfunction  . Hypothermia  . Dacryoadenitis of left lacrimal gland  . Hyponatremia  . Sacral decubitus ulcer, stage III  . Poor hygiene  . Cachexia  . Hypothyroidism    Past Medical History:  Past Medical History  Diagnosis Date  . Dementia   . HTN (hypertension)   . Atrial fibrillation     paroxysmal  . Tachycardia-bradycardia syndrome     s/p PPM by Dr Deborah Chalk  . GI bleed 2/12    ulcer to ASA  . Diastolic dysfunction   . CHF (congestive heart failure)   . Pacemaker   . Coronary artery disease    Past Surgical History:  Past Surgical History  Procedure Date  . Pacemaker insertion 09/02/09    by Dr Deborah Chalk due to tachycardia bradycardia syndrome and syncope (MDT)    PT Assessment/Plan/Recommendation PT Assessment Clinical Impression Statement: Patient presents with hypothermia and dehydration. Per son she was ambulatory up until admission for short distances to and from the bathroom. Patients current functional level is severely limted by rigidity leading to poor ROM of all extremities and poor muscle activation. Patient may benefit from a short trail of PT at a SNF, but anticipate that patient will require total care. PT Recommendation/Assessment: Patient will need skilled PT in the acute care venue PT Problem List: Decreased strength;Decreased range of motion;Decreased activity tolerance;Decreased mobility;Decreased balance;Decreased coordination;Decreased cognition;Impaired tone;Decreased knowledge of precautions;Decreased safety awareness PT Therapy Diagnosis : Generalized weakness;Altered mental status PT Plan PT Frequency:  Min 2X/week (trail) PT Treatment/Interventions: Gait training;Functional mobility training;Therapeutic activities;Therapeutic exercise;Balance training;Cognitive remediation;Patient/family education PT Recommendation Follow Up Recommendations: Skilled nursing facility Equipment Recommended: Defer to next venue PT Goals  Acute Rehab PT Goals PT Goal Formulation: With family Time For Goal Achievement: 2 weeks Pt will go Supine/Side to Sit: with max assist PT Goal: Supine/Side to Sit - Progress: Goal set today Pt will Sit at [facility] of Bed: with mod assist;with bilateral upper extremity support;3-5 min PT Goal: Sit at Edge Of Bed - Progress: Goal set today Pt will go Sit to Supine/Side: with max assist PT Goal: Sit to Supine/Side - Progress: Goal set today Pt will Transfer Bed to Chair/Chair to Bed: with +1 total assist (patient 10%) PT Transfer Goal: Bed to Chair/Chair to Bed - Progress: Goal set today  PT Evaluation Precautions/Restrictions  Precautions Precautions: Fall Restrictions Weight Bearing Restrictions: No Prior Functioning  Home Living Lives With: Son (and daughter in Social worker) Receives Help From: Family Type of Home: House Home Layout: One level Home Access: Stairs to enter Entrance Stairs-Rails: Right;Left;Can reach both Secretary/administrator of Steps: 4 Bathroom Shower/Tub: Health visitor: Standard Home Adaptive Equipment: Shower chair with back;Bedside commode/3-in-1 Additional Comments: Getting up daily per son - walking to and from bathroom max. Increased stiffness x 2 months leading to difficulty managing patient, but was up daily. Prior Function Level of Independence: Needs assistance with ADLs;Needs assistance with homemaking;Needs assistance with gait;Needs assistance with tranfers (totally dependent with house work. Sponge bath or shower) Vocation: Retired Financial risk analyst Arousal/Alertness: Awake/alert Overall Cognitive Status: History of  cognitive impairments History of Cognitive Impairment: Decline in baseline functioning Orientation Level: Oriented to person Sensation/Coordination Sensation  Additional Comments: Unable to assess secondary to poor communication from patient Extremity Assessment RLE Assessment RLE Assessment: Exceptions to [facility] RLE AROM (degrees) Overall AROM Right Lower Extremity: Deficits;Due to premorbid status;Due to impaired cognition (due to rigidity) RLE Strength RLE Overall Strength: Deficits;Due to premorbid status;Due to impaired cognition (due to rigidity) RLE Overall Strength Comments: Unable to accurately assesss as no movement to command LLE Assessment LLE Assessment: Exceptions to WFL LLE AROM (degrees) Overall AROM Left Lower Extremity: Deficits;Due to premorbid status;Due to impaired cognition (due to rigidity) LLE Strength LLE Overall Strength: Deficits;Due to premorbid status;Due to impaired cognition (due to rigidity) LLE Overall Strength Comments: Unable to assess accurately secondary to no movement to command Mobility (including Balance) Bed Mobility Bed Mobility: Yes Supine to Sit: 1: +1 Total assist Supine to Sit Details (indicate cue type and reason): Patient with severe rigidity limiting functional abilities but when she does move voluntarily movement is significantly inco-ordinated and un-controlled. Sitting - Scoot to Edge of Bed: 1: +1 Total assist Sitting - Scoot to Edge of Bed Details (indicate cue type and reason): with pad Sit to Supine: 1: +1 Total assist Transfers Transfers:  (Unable safely secondary to abnormal movements of entire body)  Posture/Postural Control Posture/Postural Control: Postural limitations Postural Limitations: Limitations secondary to severe rigidity Balance Balance Assessed: Yes Static Sitting Balance Static Sitting - Balance Support: Feet unsupported;Bilateral upper extremity supported Static Sitting - Level of Assistance: 2: Max  assist Static Sitting - Comment/# of Minutes: 5 minutes - patient with uncontrolled forward flexion of trunk leading to decreased safety. End of Session PT - End of Session Activity Tolerance:  (Limited by cognitive status) General Behavior During Session: Restless Cognition: Impaired, at baseline  Edwyna Perfect, PT  Pager [phone]  11/30/2011, 8:30 AM

## 2011-11-30 NOTE — Progress Notes (Signed)
Report called to Shinita, receiving RN on [id number].  Pt transferred to [id number] via bed with belongings, son notified of transfer.    Roselie Awkward, RN

## 2011-11-30 NOTE — Progress Notes (Signed)
Speech Language Pathology Dysphagia Treatment  Patient Details Name: Andrea Koch MRN: [id number] DOB: February 16, 1932 Today's Date: 11/30/2011  SLP Assessment/Plan/Recommendation Assessment / Recommendations / Plan Clinical Impression: Pt presented today with improved alertness and sustained attention throughout session.  Required total assist for feeding breakfast. Consumed 100% magic cup, grits, 50% eggs; 4 oz thin orange juice with no evidence of compromised airway protection.  +active mastication; good oral sensation/awareness.  After 30 minutes, pt began to fatigue with evidence of oral pocketing - meal was discontinued at that point.  Responded with occasional "yes" and "ok" to clinician questions, otherwise no efforts to self-feed or direct care due to dementia.  Given improved alertness and attention to POs, as well as adequate toleration of trials of thin liquids, rec advancing diet to Dysphagia 2 with thin liquids.  Continue full assist with meals.  Pt likely at baseline function at this point.  Will follow x1 more to ensure safety with advanced diet.   Initiate / Change Diet: Dysphagia 2 (fine chop);Thin liquid - Please order. Liquids provided via: Cup  Medication Administration: Crushed with puree Supervision: Full supervision/cueing for compensatory strategies Compensations: Slow rate;Small sips/bites;Check for pocketing Postural Changes and/or Swallow Maneuvers: Seated upright 90 degrees;Upright 30-60 min after meal Oral Care Recommendations: Oral care before and after PO Plan: Continue with current plan of care Swallowing Goals  SLP Swallowing Goals Patient will consume recommended diet without observed clinical signs of aspiration with: Moderate assistance  General Temperature Spikes Noted: No Respiratory Status: Supplemental O2 delivered via (comment) ([place] 2l) Behavior/Cognition: Alert;Confused Oral Cavity - Dentition: Missing dentition Patient Positioning: Upright in  bed  Oral Cavity - Oral Hygiene:  Oral care provided after meal     Dysphagia Treatment Treatment focused on: Skilled observation of diet tolerance;Upgraded PO texture trials Treatment Methods/Modalities: Skilled observation;Differential diagnosis Patient observed directly with PO's: Yes Type of PO's observed: Dysphagia 1 (puree);Thin liquids;Nectar-thick liquids Feeding: Total assist Liquids provided via: Cup Oral Phase Signs & Symptoms:  (increasing residue as meal progressed and as pt fatigued) Pharyngeal Phase Signs & Symptoms: Suspected delayed swallow initiation Type of cueing: Tactile;Verbal Amount of cueing: Maximal   Andrea Koch, [place] CCC/SLP Pager [phone]   Blenda Mounts [name] 11/30/2011, 9:47 AM

## 2011-11-30 NOTE — Progress Notes (Signed)
Clinical Social Work Department BRIEF PSYCHOSOCIAL ASSESSMENT 11/30/2011  Patient:  Andrea Koch, Andrea Koch     Account Number:  [id number]     Admit date:  11/27/2011  Clinical Social Worker:  Doree Albee  Date/Time:  11/30/2011 05:30 PM  Referred by:  RN  Date Referred:  11/30/2011  Other Referral:   Interview type:  Family Other interview type:    PSYCHOSOCIAL DATA Living Status:  FAMILY Admitted from facility:   Level of care:  Independent Living Primary support name:  Zeb Comfort Primary support relationship to patient:  CHILD, ADULT Degree of support available:   strong    CURRENT CONCERNS Current Concerns  Post-Acute Placement  Post-Acute Placement   Other Concerns:    SOCIAL WORK ASSESSMENT / PLAN CSW spoke with pt son as patient is only alerted to self at this time. pt son shared that pt had been living with pt son and pt daughter in law in their home.    Pt son agreed that patient will need snf care for rehab.  Pt son agreed to snf search in [place] and [place] preferring [place], and [place] [facility] as well as being open to surrounding faiclities.    CSW provided emotional support and active listening for pt son.   Assessment/plan status:  Other - See comment Other assessment/ plan:   disch   Information/referral to community resources:   snf list    PATIENT'S/FAMILY'S RESPONSE TO PLAN OF CARE: Pt son very appreciative of csw concern and support. Pt son is motivated for pt to dc to snf for rehab when medically stable.     Catha Gosselin, Theresia Majors  [phone] .11/30/2011 1735pm

## 2011-11-30 NOTE — Progress Notes (Addendum)
Patient received from 330, Alert non responsive to her name or any questions patient did she response " Yeah" when asked is she ok.  Patient is DNR  with left hand contractures,  And muilitiple bruises to her lowr and upper extremities, bilateral toes with  Scabs. Patient with foam dressing to right forearm, and sacrum x 2.  Bed arlarm set and will consider moving patient closer to nursing station. Foley cath intact draining clear dark yellow urine.

## 2011-11-30 NOTE — Progress Notes (Signed)
[facility]  Interim history: Andrea Koch is a 78 y.o. female, with advanced dementia, atrial fibrillation, sick sinus syndrome s/p pacemaker who lives with her son, was brought in after son noted that her blood pressure was high.  Upon arrival to the hospital patient was noted to to be cachectic, hypothermic, extremely poor hygiene, hypotensive tachycardic hyponatremic with multiple skin tears, left lacrimal gland infection, cellulitis in her sacral region.  She was transferred to SDU for hypotension and is still noted to have poor urine output. She was previously on continuous IVF but now receiving IV fluid boluses for decreased urine output. Her Bp has improved from 70-80s systolic to low 100s.    Subjective: She is minimally verbal this AM and I am unable to assess whether she is oriented. Per Speech therapist she "chowed down" her breakfast and she will downgrade her to thin liquids. Pt is a full assist with feeding.   Objective: Blood pressure 111/68, pulse 68, temperature 97.5 F (36.4 C), temperature source Oral, resp. rate 20, height 5' 6.93" (1.7 m), weight 55.2 kg (121 lb 11.1 oz), SpO2 99.00%. Weight change: -0.1 kg (-3.5 oz)  Intake/Output Summary (Last 24 hours) at 11/30/11 0956 Last data filed at 11/30/11 0800  Gross per 24 hour  Intake     50 ml  Output    452 ml  Net   -402 ml    Physical Exam: General appearance: alert and no distress Throat: dry mouth and tongue Lungs: clear to auscultation bilaterally Heart: regular rate and rhythm, S1, S2 normal, no murmur, click, rub or gallop Abdomen: soft, non-tender; bowel sounds normal; no masses,  no organomegaly Extremities: arms are edematous (pitting edema). Legs elevated - not edema noted currently. No cyanosis or clubbing.   Lab Results:  Basename 11/30/11 0433 11/29/11 0400  NA 128* 129*  K 4.2 3.8  CL 99 100  CO2 22 22  GLUCOSE 84 53*  BUN 20 12  CREATININE 0.45* 0.42*  CALCIUM 8.0* 8.1*  MG --  --  PHOS -- --    Basename 11/30/11 0433 11/29/11 0400  AST 41* 53*  ALT 61* 69*  ALKPHOS 175* 197*  BILITOT 0.4 0.4  PROT 4.7* 4.6*  ALBUMIN 2.2* 2.2*    Basename 11/27/11 1058  LIPASE 18  AMYLASE --    Basename 11/30/11 0433 11/29/11 0400  WBC 7.9 8.7  NEUTROABS -- --  HGB 10.5* 10.0*  HCT 29.1* 28.5*  MCV 88.2 88.5  PLT 208 168    Basename 11/28/11 0425 11/27/11 1742  CKTOTAL 82 117  CKMB 4.6* 7.1*  CKMBINDEX -- --  TROPONINI <0.30 <0.30   No components found with this basename: POCBNP:3 No results found for this basename: DDIMER:2 in the last 72 hours No results found for this basename: HGBA1C:2 in the last 72 hours No results found for this basename: CHOL:2,HDL:2,LDLCALC:2,TRIG:2,CHOLHDL:2,LDLDIRECT:2 in the last 72 hours  Basename 11/27/11 1350  TSH 11.780*  T4TOTAL --  T3FREE --  THYROIDAB --    Basename 11/27/11 1058  VITAMINB12 556  FOLATE 6.7  FERRITIN 333*  TIBC 232*  IRON 78  RETICCTPCT 0.8    Micro Results: Recent Results (from the past 240 hour(s))  URINE CULTURE     Status: Normal   Collection Time   11/27/11  8:03 AM      Component Value Range Status Comment   Specimen Description URINE, CATHETERIZED   Final    Special Requests NONE   Final    Culture  Setup Time [id number]   Final    Colony Count 9,000 COLONIES/ML   Final    Culture ESCHERICHIA COLI   Final    Report Status 11/29/2011 FINAL   Final    Organism ID, Bacteria ESCHERICHIA COLI   Final   CULTURE, BLOOD (ROUTINE X 2)     Status: Normal (Preliminary result)   Collection Time   11/27/11  9:25 AM      Component Value Range Status Comment   Specimen Description BLOOD RIGHT ARM   Final    Special Requests BOTTLES DRAWN AEROBIC AND ANAEROBIC 10CC   Final    Culture  Setup Time [id number]   Final    Culture     Final    Value:        BLOOD CULTURE RECEIVED NO GROWTH TO DATE CULTURE WILL BE HELD FOR 5 DAYS BEFORE ISSUING A FINAL NEGATIVE REPORT   Report Status PENDING    Incomplete   CULTURE, BLOOD (ROUTINE X 2)     Status: Normal (Preliminary result)   Collection Time   11/27/11  9:52 AM      Component Value Range Status Comment   Specimen Description BLOOD RIGHT ARM   Final    Special Requests BOTTLES DRAWN AEROBIC AND ANAEROBIC 10CC   Final    Culture  Setup Time [id number]   Final    Culture     Final    Value:        BLOOD CULTURE RECEIVED NO GROWTH TO DATE CULTURE WILL BE HELD FOR 5 DAYS BEFORE ISSUING A FINAL NEGATIVE REPORT   Report Status PENDING   Incomplete   MRSA PCR SCREENING     Status: Normal   Collection Time   11/27/11  1:16 PM      Component Value Range Status Comment   MRSA by PCR NEGATIVE  NEGATIVE  Final     Studies/Results: Dg Chest 1 View  11/27/2011  *RADIOLOGY REPORT*  Clinical Data: Dimension, CHF, atrial fibrillation, elevated blood pressure  CHEST - 1 VIEW  Comparison: June 17, 2010  Findings: The cardiac silhouette, mediastinum, pulmonary vasculature are within normal limits.  Pacemaker leads are intact and stable, in good position.  Both lungs are clear.  IMPRESSION: Stable chest x-ray with no evidence of acute cardiac or pulmonary process.  Original Report Authenticated By: Brandon Melnick, M.D.   Ct Head Wo Contrast  11/27/2011  *RADIOLOGY REPORT*  Clinical Data: Mental status change  CT HEAD WITHOUT CONTRAST  Technique:  Contiguous axial images were obtained from the base of the skull through the vertex without contrast.  Comparison: 08/05/2011  Findings: There is diffuse patchy low density throughout the subcortical and periventricular white matter consistent with chronic small vessel ischemic change.  There is prominence of the sulci and ventricles consistent with brain atrophy.  There is no evidence for acute brain infarct, hemorrhage or mass.  The mastoid air cells and paranasal sinuses are clear.  The skull appears intact.  IMPRESSION:  1.  Small vessel ischemic change and brain atrophy. 2.  No acute intracranial  abnormalities.  Original Report Authenticated By: Rosealee Albee, M.D.   Nm Hepatobiliary Liver Func  11/28/2011  *RADIOLOGY REPORT*  Clinical Data:  Evaluate cholecystitis as questioned on ultrasound  NUCLEAR MEDICINE HEPATOBILIARY IMAGING  Technique:  Sequential images of the abdomen were obtained out to 60 minutes following intravenous administration of radiopharmaceutical.  Radiopharmaceutical:  Tc-52m Choletec  Comparison:  None.  Findings:  Following the intravenous administration of the radiopharmaceutical there is uniform uptake by the liver with clearance from blood pool.  Radiotracer accumulation within the gallbladder is identified by 20 minutes.  Radiotracer activity within the common bile duct and small bowel is noted within 10 minutes.  IMPRESSION:  1.  Patent cystic duct without evidence for acute cholecystitis.  Original Report Authenticated By: Rosealee Albee, M.D.   US Abdomen Complete  11/27/2011  *RADIOLOGY REPORT*  Clinical Data:  Unresponsive, elevated LFTs  COMPLETE ABDOMINAL ULTRASOUND  Comparison:  None.  Findings:  Gallbladder:  Is distended with a striated thickened wall measuring up to 4 mm in thickness.  There is layering sludge in the lumen. Equivocal sonographic Murphy's sign.  There is a trace amount of pericholecystic fluid.  Common bile duct:  4 mm diameter, unremarkable.  Liver:  No focal lesion identified.  Within normal limits in parenchymal echogenicity.  IVC:  Appears normal.  Pancreas:  Largely obscured by overlying bowel gas.  Spleen:  8.4 cm craniocaudal length, unremarkable  Right Kidney:  9.7 cm, isoechoic to the adjacent liver.  No focal lesion or hydronephrosis.  Left Kidney:  8.3 cm. No hydronephrosis.  Well-preserved cortex. Normal size and parenchymal echotexture without focal abnormalities.  Abdominal aorta:  Atheromatous calcifications without aneurysm.  Note made of a right pleural effusion.  IMPRESSION:  1. Distended gallbladder with thickened    wall and pericholecystic fluid, suggestive of cholecystitis.  If there is continued clinical uncertainty, hepatobiliary scintigraphy may be useful. 2.  Small right pleural effusion.  Original Report Authenticated By: Osa Craver, M.D.    Medications: Scheduled Meds:   . sodium chloride   Intravenous Once  . ciprofloxacin  2 drop Left Eye BID  . digoxin  0.125 mg Intravenous Daily  . feeding supplement  1 Container Oral TID BM  . levothyroxine  12.5 mcg Intravenous Daily  . pantoprazole (PROTONIX) IV  40 mg Intravenous Q12H  . piperacillin-tazobactam (ZOSYN)  IV  3.375 g Intravenous Q8H  . sodium chloride  500 mL Intravenous Once  . sodium chloride  3 mL Intravenous Q12H   Continuous Infusions:  PRN Meds:.albuterol, guaiFENesin-dextromethorphan, HYDROcodone-acetaminophen, ondansetron (ZOFRAN) IV, ondansetron  Assessment/Plan: Hypothermia  Resolved, used warming blankets.   Dehydration  Suspected to be from poor by mouth intake. Lasix on hold   Hyponatremia  Likely due to dehydration improved with IV hydration, improved. Hold IV fluids for now and cont to see if her PO fluid intake is improving.  Lasix on hold   History of Atrial fibrillation, Sinoatrial node dysfunction, Tachycardia-bradycardia syndrome, chronic Diastolic dysfunction, status post pacemaker placement.  Currently the patient is paced. Continue digoxin.  Per son patient is due for her interrogation of her pacemaker, I have contacted [facility] cardiology for consideration of interrogating her pacemaker.  Unless rate becomes uncontrolled, will hold off on Metoprolol due to hypotension.  she is allergic to aspirin.   Dacryocystitis of left lacrimal gland  Post bedside Nasolacrimal duct Probing and irrigation with topical anesthetic by Dr Karleen Hampshire on 11/28/11, continue Cipro eye drops, improved.   Dysphasia  Diet actually advance today to thin liquids.    Malnutrition/Severe protein calorie malnutrition  Ensure  pudding.   Sacral decubitus ulcer, stage III  Continue wound care.   Day 3 of zosyn.   Elevated liver enzymes  etiology unclear  HIDA scan on 11/28/2011 shows patent cystic duct without evidence for acute cholecystitis. Ultrasound on 11/27/2011 showed distended gallbladder with thickened wall  and pericholecystic fluid suggestive of cholecystitis. HIDA negative- surgery has signed off.  Negative hepatitis panel.  Anemia of chronic disease  Hemoglobin stable. Serum iron 78, TIBC 154, TIBC 232, ferritin 333, panel done on November 27, 2011.   Hypothyroidism  TSH elevated, free T4 0.72 (low)   Synthroid PO 25  CODE STATUS  DNR/DNI, discussed with patient's son today and reconfirmed with patient's son A discussion was held with the patient's son regarding palliative care due to her poor PO intake but he is not yet ready for this.  She is now needs total assistance with feeds- not sure if they were appropriately feeding her at home. He wants her to go to SNF from here.   Disposition  Transfer out of SDU today.   LOS: 3 days   [facility] [phone] 11/30/2011, 9:56 AM

## 2011-11-30 NOTE — Progress Notes (Addendum)
Clinical Social Work Department CLINICAL SOCIAL WORK PLACEMENT NOTE 11/30/2011  Patient:  Andrea Koch, Andrea Koch  Account Number:  [id number] Admit date:  11/27/2011  Clinical Social Worker:  Doree Albee  Date/time:  11/30/2011 05:35 PM  Clinical Social Work is seeking post-discharge placement for this patient at the following level of care:   SKILLED NURSING   (*CSW will update this form in Epic as items are completed)   11/30/2011  Patient/family provided with [facility] Department of Clinical Social Work's list of facilities offering this level of care within the geographic area requested by the patient (or if unable, by the patient's family).  11/30/2011  Patient/family informed of their freedom to choose among providers that offer the needed level of care, that participate in Medicare, Medicaid or managed care program needed by the patient, have an available bed and are willing to accept the patient.  11/30/2011  Patient/family informed of [facility]' ownership interest in [facility], as well as of the fact that they are under no obligation to receive care at this facility.  PASARR submitted to EDS on 11/30/2011 PASARR number received from EDS on   FL2 transmitted to all facilities in geographic area requested by pt/family on  11/30/2011 FL2 transmitted to all facilities within larger geographic area on   Patient informed that his/her managed care company has contracts with or will negotiate with  certain facilities, including the following:     Patient/family informed of bed offers received:  12/01/2011  Patient chooses bed at [facility] side  Physician recommends and patient chooses bed at    Patient to be transferred to  on   Patient to be transferred to facility by   The following physician request were entered in Epic:   Additional Comments:  .Catha Gosselin, Theresia Majors  [phone] .11/30/2011 1736pm

## 2011-12-01 LAB — DIGOXIN LEVEL: Digoxin Level: 0.6 ng/mL — ABNORMAL LOW (ref 0.8–2.0)

## 2011-12-01 MED ORDER — ONDANSETRON HCL 4 MG/2ML IJ SOLN: 4.0000 mg | Freq: Four times a day (QID) | INTRAMUSCULAR | Status: DC | PRN

## 2011-12-01 MED ORDER — FUROSEMIDE 20 MG PO TABS
20.0000 mg | ORAL_TABLET | Freq: Every day | ORAL | Status: DC
  Administered 2011-12-01 – 2011-12-04 (×4): 20 mg via ORAL
  Filled 2011-12-01 (×4): qty 1

## 2011-12-01 NOTE — Progress Notes (Signed)
Speech Language Pathology Dysphagia Treatment  Patient Details Name: Andrea Koch MRN: [id number] DOB: 12-12-1931 Today's Date: 12/01/2011  SLP Assessment/Plan/Recommendation Assessment / Recommendations / Plan Clinical Impression Statement: Pt appears declined compared to SLP report yesterday. Pt required max verbal and visual cues to become aware of POs and tactile cues to achieve labial closure around straw for sips. Pt could not approximate lips for labial closure with cup sips. With presentations of saltine the pt took small bite then held unmasticated saltine on lingual surface with no manipulation whicn was removed by SLP. NT reports pt vomited during meal and feeding was discontinued. At this time the pt is demonstrating variable awareness of POs and function as well as possible intolerance of PO intake per NT report. SLP will recommend continuing thin liquids as there was not evidence of decreased airway protection but will retun pt to puree consistency to facilitate maximal input if pt unable to masticate solids.  Initiate / Change Diet: Dysphagia 1 (puree);Thin liquid Liquids provided via: Straw;Cup Medication Administration: Crushed with puree Supervision: Full supervision/cueing for compensatory strategies Compensations: Slow rate;Small sips/bites;Check for pocketing Postural Changes and/or Swallow Maneuvers: Seated upright 90 degrees;Upright 30-60 min after meal Oral Care Recommendations: Oral care before and after PO Plan: Continue with current plan of care Swallowing Goals  SLP Swallowing Goals Patient will consume recommended diet without observed clinical signs of aspiration with: Moderate assistance Swallow Study Goal #1 - Progress: Not Met Patient will utilize recommended strategies during swallow to increase swallowing safety with: Moderate assistance Swallow Study Goal #2 - Progress: Not met  General Temperature Spikes Noted: No Respiratory Status: Supplemental O2  delivered via (comment) Behavior/Cognition: Alert;Confused Oral Cavity - Dentition: Missing dentition Patient Positioning: Upright in bed   Dysphagia Treatment Treatment focused on: Skilled observation of diet tolerance Treatment Methods/Modalities: Skilled observation;Differential diagnosis Patient observed directly with PO's: Yes Type of PO's observed: Thin liquids;Dysphagia 2 (chopped) Feeding: Total assist Liquids provided via: Straw Oral Phase Signs & Symptoms: Prolonged oral phase Pharyngeal Phase Signs & Symptoms: Suspected delayed swallow initiation;Audible swallow Type of cueing: Tactile;Verbal Amount of cueing: Maximal  Andrea Ditty, MA CCC-SLP [phone] Andrea Koch 12/01/2011, 10:23 AM

## 2011-12-01 NOTE — Progress Notes (Signed)
12/01/2011 Calla Wedekind [facility] Case Management Note [phone]  Utilization review completed.  

## 2011-12-01 NOTE — Progress Notes (Signed)
Addendum  Patient seen and examined, chart and data base reviewed.  I agree with the above assessment and plan  For full details please see Mrs. Algis Downs PA. Note.  Waiting for a skilled nursing facility placement.  Clint Lipps Pager: [phone] 12/01/2011, 4:25 PM

## 2011-12-01 NOTE — Progress Notes (Signed)
CSW attempted to provide pt son with bed offers, however csw was unable to reach pt son. CSW left message with pt daughter in law for pt son to call csw. CSW also called patient son work number, and pt son was off work today. Pt nurse stated pt son had visited earlier today, but was no longer present at bedside. CSW continuing to follow patient and will attempt to provide bed offers at later time.   Catha Gosselin, Theresia Majors  [phone] .12/01/2011 1505pm

## 2011-12-01 NOTE — Progress Notes (Signed)
Patient ID: Andrea Koch, female   DOB: May 27, 1932, 78 y.o.   MRN: [id number]  Pick up from T1 on 12/01/11  Interim history: Andrea Koch is a 78 y.o. female, with advanced dementia, atrial fibrillation, sick sinus syndrome s/p pacemaker who lives with her son, was brought in after son noted that her blood pressure was high. Upon arrival to the hospital patient was noted to to be cachectic, hypothermic, extremely poor hygiene, hypotensive tachycardic hyponatremic with multiple skin tears, left lacrimal gland infection, cellulitis in her sacral region.  She was transferred to SDU for hypotension and is still noted to have poor urine output. She was previously on continuous IVF but now receiving IV fluid boluses for decreased urine output. Her Bp has improved from 70-80s systolic to low 100s.   On 12/01/11 she was transferred to the floor and had an episode of vomiting.  Still extremely weak and lethargic.   Subjective: Denies pain.  Barely speaks.  Follows commands.  Appears very weak  Objective: Blood pressure 118/62, pulse 60, temperature 97.7 F (36.5 C), temperature source Axillary, resp. rate 17, height 5' 6.93" (1.7 m), weight 62.4 kg (137 lb 9.1 oz), SpO2 95.00%. Weight change: 7.2 kg (15 lb 14 oz)  Intake/Output Summary (Last 24 hours) at 12/01/11 0709 Last data filed at 12/01/11 0610  Gross per 24 hour  Intake    770 ml  Output   1225 ml  Net   -455 ml    Physical Exam: General appearance: alert and no distress Throat: dry mouth and tongue Lungs: clear to auscultation bilaterally, with shallow inspirations Heart: regular rate and rhythm, S1, S2 normal, no murmur, click, rub or gallop Abdomen: soft, non-tender; bowel sounds normal; no masses,  no organomegaly Extremities: arms with anasarca. Legs no edema noted, multiple bruises. No cyanosis or clubbing.   Lab Results:  Basename 11/30/11 0433 11/29/11 0400  NA 128* 129*  K 4.2 3.8  CL 99 100  CO2 22 22  GLUCOSE 84  53*  BUN 20 12  CREATININE 0.45* 0.42*  CALCIUM 8.0* 8.1*  MG -- --  PHOS -- --    Basename 11/30/11 0433 11/29/11 0400  AST 41* 53*  ALT 61* 69*  ALKPHOS 175* 197*  BILITOT 0.4 0.4  PROT 4.7* 4.6*  ALBUMIN 2.2* 2.2*    Basename 11/30/11 0433 11/29/11 0400  WBC 7.9 8.7  NEUTROABS -- --  HGB 10.5* 10.0*  HCT 29.1* 28.5*  MCV 88.2 88.5  PLT 208 168    Micro Results:   Studies/Results: Dg Chest 1 View  11/27/2011  *RADIOLOGY REPORT*  Clinical Data: Dimension, CHF, atrial fibrillation, elevated blood pressure  CHEST - 1 VIEW  Comparison: June 17, 2010  Findings: The cardiac silhouette, mediastinum, pulmonary vasculature are within normal limits.  Pacemaker leads are intact and stable, in good position.  Both lungs are clear.  IMPRESSION: Stable chest x-ray with no evidence of acute cardiac or pulmonary process.  Original Report Authenticated By: Brandon Melnick, M.D.   Ct Head Wo Contrast  11/27/2011  *RADIOLOGY REPORT*  Clinical Data: Mental status change  CT HEAD WITHOUT CONTRAST  Technique:  Contiguous axial images were obtained from the base of the skull through the vertex without contrast.  Comparison: 08/05/2011  Findings: There is diffuse patchy low density throughout the subcortical and periventricular white matter consistent with chronic small vessel ischemic change.  There is prominence of the sulci and ventricles consistent with brain atrophy.  There is no evidence  for acute brain infarct, hemorrhage or mass.  The mastoid air cells and paranasal sinuses are clear.  The skull appears intact.  IMPRESSION:  1.  Small vessel ischemic change and brain atrophy. 2.  No acute intracranial abnormalities.  Original Report Authenticated By: Rosealee Albee, M.D.   Nm Hepatobiliary Liver Func  11/28/2011  *RADIOLOGY REPORT*  Clinical Data:  Evaluate cholecystitis as questioned on ultrasound  NUCLEAR MEDICINE HEPATOBILIARY IMAGING  Technique:  Sequential images of the abdomen were  obtained out to 60 minutes following intravenous administration of radiopharmaceutical.  Radiopharmaceutical:  Tc-87m Choletec  Comparison:  None.  Findings:   Following the intravenous administration of the radiopharmaceutical there is uniform uptake by the liver with clearance from blood pool.  Radiotracer accumulation within the gallbladder is identified by 20 minutes.  Radiotracer activity within the common bile duct and small bowel is noted within 10 minutes.  IMPRESSION:  1.  Patent cystic duct without evidence for acute cholecystitis.  Original Report Authenticated By: Rosealee Albee, M.D.   US Abdomen Complete  11/27/2011  *RADIOLOGY REPORT*  Clinical Data:  Unresponsive, elevated LFTs  COMPLETE ABDOMINAL ULTRASOUND  Comparison:  None.  Findings:  Gallbladder:  Is distended with a striated thickened wall measuring up to 4 mm in thickness.  There is layering sludge in the lumen. Equivocal sonographic Murphy's sign.  There is a trace amount of pericholecystic fluid.  Common bile duct:  4 mm diameter, unremarkable.  Liver:  No focal lesion identified.  Within normal limits in parenchymal echogenicity.  IVC:  Appears normal.  Pancreas:  Largely obscured by overlying bowel gas.  Spleen:  8.4 cm craniocaudal length, unremarkable  Right Kidney:  9.7 cm, isoechoic to the adjacent liver.  No focal lesion or hydronephrosis.  Left Kidney:  8.3 cm. No hydronephrosis.  Well-preserved cortex. Normal size and parenchymal echotexture without focal abnormalities.  Abdominal aorta:  Atheromatous calcifications without aneurysm.  Note made of a right pleural effusion.  IMPRESSION:  1. Distended gallbladder with thickened   wall and pericholecystic fluid, suggestive of cholecystitis.  If there is continued clinical uncertainty, hepatobiliary scintigraphy may be useful. 2.  Small right pleural effusion.  Original Report Authenticated By: Osa Craver, M.D.    Medications: Scheduled Meds:    .  ciprofloxacin  2 drop Left Eye BID  . digoxin  0.125 mg Intravenous Daily  . feeding supplement  237 mL Oral TID BM  . feeding supplement  1 Container Oral TID BM  . levothyroxine  25 mcg Oral QAC breakfast  . piperacillin-tazobactam (ZOSYN)  IV  3.375 g Intravenous Q8H  . sodium chloride  3 mL Intravenous Q12H  . DISCONTD: levothyroxine  12.5 mcg Intravenous Daily  . DISCONTD: pantoprazole (PROTONIX) IV  40 mg Intravenous Q12H   Continuous Infusions:  PRN Meds:.albuterol, guaiFENesin-dextromethorphan, HYDROcodone-acetaminophen, ondansetron (ZOFRAN) IV, ondansetron  Assessment/Plan: Hypothermia  Resolved, used warming blankets.   Dehydration  Suspected to be from poor by mouth intake.   Hyponatremia (124 on admission) Likely due to dehydration improved with IV hydration, improved.  Will restart lasix (4/3)   History of Atrial fibrillation, Sinoatrial node dysfunction, Tachycardia-bradycardia syndrome, chronic Diastolic dysfunction, status post pacemaker placement.  Currently the patient is paced. Continue digoxin.  Per son patient is due for her interrogation of her pacemaker, I have contacted [facility] cardiology for consideration of interrogating her pacemaker.  Unless rate becomes uncontrolled, will hold off on Metoprolol due to hypotension.  she is allergic to aspirin.  Dacryocystitis of left lacrimal gland  Post bedside Nasolacrimal duct Probing and irrigation with topical anesthetic by Dr Karleen Hampshire on 11/28/11, continue Cipro eye drops, improved.   Dysphasia  Diet actually advance today to thin liquids.    Malnutrition/Severe protein calorie malnutrition  Ensure pudding.   Sacral decubitus ulcer, stage III  Continue wound care.   Day 4 of zosyn.  Will change to oral medication before discharge.  Elevated liver enzymes  etiology unclear  HIDA scan on 11/28/2011 shows patent cystic duct without evidence for acute cholecystitis. Ultrasound on 11/27/2011 showed distended  gallbladder with thickened wall and pericholecystic fluid suggestive of cholecystitis. HIDA negative- surgery has signed off.  Negative hepatitis panel.  Anemia of chronic disease  Hemoglobin stable. Serum iron 78, TIBC 154, TIBC 232, ferritin 333, panel done on November 27, 2011.   Hypothyroidism  TSH elevated, free T4 0.72 (low)   Synthroid PO 25  CODE STATUS  DNR/DNI reconfirmed with patient's son A discussion was held with the patient's son regarding palliative care due to her poor PO intake but he is not yet ready for this.  She is now needs total assistance with feeds- not sure if they were appropriately feeding her at home. He wants her to go to SNF from here.   Disposition  To SNF with medically ready.   LOS: 4 days   Stephani Police [phone] 12/01/2011, 7:09 AM

## 2011-12-02 LAB — BASIC METABOLIC PANEL
CO2: 27 mEq/L (ref 19–32)
Chloride: 96 mEq/L (ref 96–112)
Glucose, Bld: 73 mg/dL (ref 70–99)
Potassium: 3.9 mEq/L (ref 3.5–5.1)
Sodium: 129 mEq/L — ABNORMAL LOW (ref 135–145)

## 2011-12-02 MED ORDER — DIGOXIN 250 MCG PO TABS
0.2500 mg | ORAL_TABLET | Freq: Once | ORAL | Status: AC
  Administered 2011-12-02: 0.25 mg via ORAL
  Filled 2011-12-02: qty 1

## 2011-12-02 MED ORDER — DIGOXIN 250 MCG PO TABS
0.2500 mg | ORAL_TABLET | Freq: Once | ORAL | Status: DC
  Filled 2011-12-02: qty 1

## 2011-12-02 MED ORDER — DIGOXIN 125 MCG PO TABS
0.1250 mg | ORAL_TABLET | Freq: Every day | ORAL | Status: DC
  Administered 2011-12-03 – 2011-12-04 (×2): 0.125 mg via ORAL
  Filled 2011-12-02 (×2): qty 1

## 2011-12-02 NOTE — Progress Notes (Signed)
   CARE MANAGEMENT NOTE 12/02/2011  Patient:  Andrea Koch, Andrea Koch   Account Number:  [id number]  Date Initiated:  12/02/2011  Documentation initiated by:  Letha Cape  Subjective/Objective Assessment:   dx hypothermia, hyponatremia  admit- lives with children.     Action/Plan:   pt eval- rec snf.   Anticipated DC Date:  12/03/2011   Anticipated DC Plan:  SKILLED NURSING FACILITY  In-house referral  Clinical Social Worker      DC Planning Services  CM consult      Choice offered to / List presented to:             Status of service:  In process, will continue to follow Medicare Important Message given?   (If response is "NO", the following Medicare IM given date fields will be blank) Date Medicare IM given:   Date Additional Medicare IM given:    Discharge Disposition:    Per UR Regulation:    If discussed at Long Length of Stay Meetings, dates discussed:    Comments:  12/02/11 15:36 Letha Cape RN, BSN [phone] discharge plan is for snf, CSW following, NA is 128, if improves may possibly dc today or tomorrow.

## 2011-12-02 NOTE — Progress Notes (Signed)
PATIENT DETAILS Name: Andrea Koch Age: 78 y.o. Sex: female Date of Birth: 18-Sep-1931 Admit Date: 11/27/2011 WUJ:[id number] Not In System POA:   CONSULTS: Dr. Karleen Hampshire, opthalmology  Palliative Consult (holding off until discussed with son) Wound care RN CCS   Interim history:  No events overnight. Attempted to reach pt's son at home and at work today. He left message with RN stating that he would be available after 5pm on 4/5 to discuss patient. He was not able to take call at work today.  Subjective: Pt not participating with exam. She will awaken with verbal stimuli, but mostly nonverbal today  Objective: Vital signs in last 24 hours: Temp:  [97 F (36.1 C)-98.1 F (36.7 C)] 97.8 F (36.6 C) (04/04 1430) Pulse Rate:  [63-86] 82  (04/04 1430) Resp:  [15-18] 15  (04/04 1430) BP: (86-125)/(57-70) 94/70 mmHg (04/04 1430) SpO2:  [93 %-98 %] 93 % (04/04 1430) Weight:  [60.3 kg (132 lb 15 oz)-60.7 kg (133 lb 13.1 oz)] 60.3 kg (132 lb 15 oz) (04/04 0944) Weight change: -1.7 kg (-3 lb 12 oz) Last BM Date: 12/02/11  Intake/Output from previous day:  Intake/Output Summary (Last 24 hours) at 12/02/11 1505 Last data filed at 12/02/11 1250  Gross per 24 hour  Intake    670 ml  Output   2950 ml  Net  -2280 ml     Physical Exam:  Gen:  Very frail, cachetic elderly female in NAD Cardiovascular:  S1S2 RRR, no m/r/g Respiratory: decreased inspiratory effort but CTAB, no w/r/c, no increased wob Gastrointestinal: abdomen soft, seemingly NT, BS+ Extremities: no LE edema   Lab Results:  Lab 11/30/11 0433 11/29/11 0400 11/28/11 0423  HGB 10.5* 10.0* 10.1*  HCT 29.1* 28.5* 28.6*  WBC 7.9 8.7 10.1  PLT 208 168 153     Lab 12/02/11 0830 11/30/11 0433 11/29/11 0400 11/28/11 0423 11/27/11 0747  NA 129* 128* 129* 124* 124*  K 3.9 4.2 -- -- --  CL 96 99 100 93* 88*  CO2 27 22 22 23 26   GLUCOSE 73 84 53* 85 74  BUN 9 20 12 14 17   CREATININE 0.36* 0.45* 0.42* 0.41* 0.36*    CALCIUM 8.0* 8.0* 8.1* 8.2* 8.3*  MG -- -- -- -- --  PHOS -- -- -- -- --    Studies/Results: Dg Chest 1 View  11/27/2011 *RADIOLOGY REPORT* Clinical Data: Dimension, CHF, atrial fibrillation, elevated blood pressure CHEST - 1 VIEW Comparison: June 17, 2010 Findings: The cardiac silhouette, mediastinum, pulmonary vasculature are within normal limits. Pacemaker leads are intact and stable, in good position. Both lungs are clear. IMPRESSION: Stable chest x-ray with no evidence of acute cardiac or pulmonary process. Original Report Authenticated By: Brandon Melnick, M.D.  Ct Head Wo Contrast  11/27/2011 *RADIOLOGY REPORT* Clinical Data: Mental status change CT HEAD WITHOUT CONTRAST Technique: Contiguous axial images were obtained from the base of the skull through the vertex without contrast. Comparison: 08/05/2011 Findings: There is diffuse patchy low density throughout the subcortical and periventricular white matter consistent with chronic small vessel ischemic change. There is prominence of the sulci and ventricles consistent with brain atrophy. There is no evidence for acute brain infarct, hemorrhage or mass. The mastoid air cells and paranasal sinuses are clear. The skull appears intact. IMPRESSION: 1. Small vessel ischemic change and brain atrophy. 2. No acute intracranial abnormalities. Original Report Authenticated By: Rosealee Albee, M.D.  Nm Hepatobiliary Liver Func  11/28/2011 *RADIOLOGY REPORT* Clinical Data: Evaluate cholecystitis  as questioned on ultrasound NUCLEAR MEDICINE HEPATOBILIARY IMAGING Technique: Sequential images of the abdomen were obtained out to 60 minutes following intravenous administration of radiopharmaceutical. Radiopharmaceutical: Tc-35m Choletec Comparison: None. Findings: Following the intravenous administration of the radiopharmaceutical there is uniform uptake by the liver with clearance from blood pool. Radiotracer accumulation within the gallbladder is  identified by 20 minutes. Radiotracer activity within the common bile duct and small bowel is noted within 10 minutes. IMPRESSION: 1. Patent cystic duct without evidence for acute cholecystitis. Original Report Authenticated By: Rosealee Albee, M.D.  US Abdomen Complete  11/27/2011 *RADIOLOGY REPORT* Clinical Data: Unresponsive, elevated LFTs COMPLETE ABDOMINAL ULTRASOUND Comparison: None. Findings: Gallbladder: Is distended with a striated thickened wall measuring up to 4 mm in thickness. There is layering sludge in the lumen. Equivocal sonographic Murphy's sign. There is a trace amount of pericholecystic fluid. Common bile duct: 4 mm diameter, unremarkable. Liver: No focal lesion identified. Within normal limits in parenchymal echogenicity. IVC: Appears normal. Pancreas: Largely obscured by overlying bowel gas. Spleen: 8.4 cm craniocaudal length, unremarkable Right Kidney: 9.7 cm, isoechoic to the adjacent liver. No focal lesion or hydronephrosis. Left Kidney: 8.3 cm. No hydronephrosis. Well-preserved cortex. Normal size and parenchymal echotexture without focal abnormalities. Abdominal aorta: Atheromatous calcifications without aneurysm. Note made of a right pleural effusion. IMPRESSION: 1. Distended gallbladder with thickened wall and pericholecystic fluid, suggestive of cholecystitis. If there is continued clinical uncertainty, hepatobiliary scintigraphy may be useful. 2. Small right pleural effusion. Original Report Authenticated By: Osa Craver, M.D.    Medications: Scheduled Meds:   . ciprofloxacin  2 drop Left Eye BID  . digoxin  0.125 mg Oral Daily  . digoxin  0.25 mg Oral Once  . feeding supplement  237 mL Oral TID BM  . feeding supplement  1 Container Oral TID BM  . furosemide  20 mg Oral Daily  . levothyroxine  25 mcg Oral QAC breakfast  . piperacillin-tazobactam (ZOSYN)  IV  3.375 g Intravenous Q8H  . sodium chloride  3 mL Intravenous Q12H  . DISCONTD: digoxin  0.125 mg  Intravenous Daily  . DISCONTD: digoxin  0.25 mg Oral Once   Continuous Infusions:  PRN Meds:.albuterol, guaiFENesin-dextromethorphan, HYDROcodone-acetaminophen, ondansetron (ZOFRAN) IV, ondansetron Antibiotics: Anti-infectives     Start     Dose/Rate Route Frequency Ordered Stop   11/27/11 1400  piperacillin-tazobactam (ZOSYN) IVPB 3.375 g       3.375 g 12.5 mL/hr over 240 Minutes Intravenous 3 times per day 11/27/11 1327             Assessment/Plan:  1. Hypothermia  Resolved, used warming blankets.   2. Dehydration  Suspected to be from poor by mouth intake. Lasix restarted on 4/3  3. Hyponatremia (124 on admission)  Likely due to dehydration.  improved with IV hydration. Monitor with po lasix  4. History of Atrial fibrillation, Sinoatrial node dysfunction, Tachycardia-bradycardia syndrome, chronic Diastolic dysfunction, status post pacemaker placement.  Currently the patient is paced. Continue digoxin.  Per son patient is due for her interrogation of her pacemaker, I have contacted [facility] cardiology for consideration of interrogating her pacemaker.  Unless rate becomes uncontrolled, will hold off on Metoprolol due to hypotension.  she is allergic to aspirin.   5. Dacryocystitis of left lacrimal gland  Post bedside Nasolacrimal duct Probing and irrigation with topical anesthetic by Dr Karleen Hampshire on 11/28/11, continue Cipro eye drops, improved.   6. Dysphasia  SLP continues to follow. Continue dysphagia I diet with thin  liquids   7. Malnutrition/Severe protein calorie malnutrition  Ensure pudding.    8. Sacral decubitus ulcer, stage III  silicone foam dressings to sacral pressure ulcers, to be changed every 3 days and PRN soilage. Pt needs air overlay if dc to home or to SNF per [facility] recommendations; Day 6 of zosyn (see below)  9. Elevated liver enzymes  etiology unclear  HIDA scan on 11/28/2011 shows patent cystic duct without evidence for acute cholecystitis. Ultrasound on  11/27/2011 showed distended gallbladder with thickened wall and pericholecystic fluid suggestive of cholecystitis.  HIDA negative- surgery has signed off.  Negative hepatitis panel.   10. Anemia of chronic disease  Hemoglobin stable. Serum iron 78, TIBC 154, TIBC 232, ferritin 333, panel done on November 27, 2011.   11. Hypothyroidism  TSH elevated, free T4 0.72 (low)  On Synthroid PO 25   12. CODE STATUS  DNR/DNI  13. E.Coli UTI:  on Zosyn D6. Can likely d/c after 4/5 dose  14. Dispo:  Pt is extremely high risk for recurrent hospitalizations. I am unable to get in touch with her son to discuss arranging a palliative care meeting to discuss goals of care. Consult cannot be done at [facility]. May have to proceed with consult if unable to contact son in am.   Cordelia Pen, NP-C [facility]  pgr [phone]     LOS: 5 days    12/02/2011, 3:05 PM

## 2011-12-02 NOTE — Progress Notes (Signed)
Speech Language Pathology Dysphagia Treatment  Patient Details Name: Andrea Koch MRN: [id number] DOB: September 30, 1931 Today's Date: 12/02/2011  SLP Assessment/Plan/Recommendation Assessment / Recommendations / Plan Clinical Impression Statement: Pt with limited LOA today, responds to question with verbalizations and follows some commands but does not open eyes. Pt accepted ice chip and demonstrated prlonged oral manipulation with audibly wet vocal quality. Verbal cues resulted in swallow response.  NT reports that pt actively consumed first 30% of meal, then began pocketing POs. NT discontinued POs. At this time the pt is recommended to continue current diet (dysphagia 1 with thin liquids), only being fed when alert and accepting POs. Feel this pattern of intake is likely pts baseline. SLP cannot offer any further skilled treatment at this time. Will sign off. Please reorder if jurther concerns arise.  Continue with Current Diet: Dysphagia 1 (puree);Thin liquid Liquids provided via: Straw;Cup Medication Administration: Crushed with puree Supervision: Full supervision/cueing for compensatory strategies Compensations: Slow rate;Small sips/bites;Check for pocketing Postural Changes and/or Swallow Maneuvers: Seated upright 90 degrees;Upright 30-60 min after meal Oral Care Recommendations: Oral care before and after PO Plan: Discharge SLP treatment due to (comment) (No further skilled treatment needed. )  General Temperature Spikes Noted: No Respiratory Status: Supplemental O2 delivered via (comment) Behavior/Cognition: Lethargic;Confused Oral Cavity - Dentition: Missing dentition Patient Positioning: Postural control interferes with function   Dysphagia Treatment Treatment focused on: Skilled observation of diet tolerance Treatment Methods/Modalities: Skilled observation Patient observed directly with PO's: Yes Type of PO's observed: Ice chips Feeding: Total assist Liquids provided via:  Teaspoon Oral Phase Signs & Symptoms: Prolonged oral phase Pharyngeal Phase Signs & Symptoms: Suspected delayed swallow initiation;Audible swallow Type of cueing: Tactile;Verbal Amount of cueing: Maximal  [name], MA CCC-SLP [phone]  [name] 12/02/2011, 11:16 AM

## 2011-12-02 NOTE — Progress Notes (Signed)
MEDICATION RELATED CONSULT NOTE - FOLLOW UP   Pharmacy Consult for : Digoxin Indication: PO to IV dosing  Allergies  Allergen Reactions  . Aspirin Other (See Comments)    Ulcer - bleeding  . Sulfonamide Derivatives     Vital Signs: Temp: 97 F (36.1 C) (04/04 0944) Temp src: Axillary (04/04 0944) BP: 86/57 mmHg (04/04 0944) Pulse Rate: 70  (04/04 0944)  Labs:  Basename 12/02/11 0830 11/30/11 0433  WBC -- 7.9  HGB -- 10.5*  HCT -- 29.1*  PLT -- 208  APTT -- --  CREATININE 0.36* 0.45*  LABCREA -- --  CREATININE 0.36* 0.45*  CREAT24HRUR -- --  MG -- --  PHOS -- --  ALBUMIN -- 2.2*  PROT -- 4.7*  ALBUMIN -- 2.2*  AST -- 41*  ALT -- 61*  ALKPHOS -- 175*  BILITOT -- 0.4  BILIDIR -- --  IBILI -- --   Estimated Creatinine Clearance: 56.1 ml/min (by C-G formula based on Cr of 0.36).   Digoxin level 0.6 ng/ml (Goal 0.8 - 2)  12/01/2011  Medications:     ciprofloxacin 2 drop Left Eye BID  digoxin 0.125 mg Oral Daily  digoxin 0.25 mg Oral Once  feeding supplement 237 mL Oral TID BM  feeding supplement 1 Container Oral TID BM  furosemide 20 mg Oral Daily  levothyroxine 25 mcg Oral QAC breakfast  piperacillin-tazobactam (ZOSYN)  IV 3.375 g Intravenous Q8H  sodium chloride 3 mL Intravenous Q12H  DISCONTD: digoxin 0.125 mg Intravenous Daily   Assessment:  Patient is now resuming po medications.  Digoxin level SUBtherapeutic. Patient has already received IV digoxin 0.125 mg dose today.  Goal of Therapy:   Digoxin levels 0.8 - 2 ng/ml  Plan:  DC IV Digoxin.   Give extra dose of Digoxin 0.25 mg today (po). Total dose of Digoxin 0.375 mg today.  Resume Digoxin 0.125 mg beginning 12/03/11.  Pharmacy will sign off.  If any additional service is require, please call Pharmacy for assistance.  Thank you.  Aryahi Denzler, Elisha Headland, Pharm.D. 12/02/2011 12:56 PM

## 2011-12-02 NOTE — Progress Notes (Signed)
Physical Therapy Treatment Patient Details Name: SHAWNICE TILMON MRN: [id number] DOB: 1931-12-13 Today's Date: 12/02/2011  PT Assessment/Plan  PT - Assessment/Plan Comments on Treatment Session: Pt with very little participation.  Mobility very poor (totally dependent). PT Plan: Discharge plan remains appropriate Follow Up Recommendations: Skilled nursing facility Equipment Recommended: Defer to next venue PT Goals  Acute Rehab PT Goals PT Goal: Supine/Side to Sit - Progress: Not progressing PT Goal: Sit at Edge Of Bed - Progress: Not progressing PT Goal: Sit to Supine/Side - Progress: Not progressing PT Transfer Goal: Bed to Chair/Chair to Bed - Progress: Not progressing  PT Treatment Precautions/Restrictions  Precautions Precautions: Fall Restrictions Weight Bearing Restrictions: No Mobility (including Balance) Bed Mobility Supine to Sit: 1: +1 Total assist Supine to Sit Details (indicate cue type and reason): pt assisted with moving her legs toward edge of bed. Sitting - Scoot to Edge of Bed: 1: +1 Total assist Sitting - Scoot to Edge of Bed Details (indicate cue type and reason): Used pad to facilitate Sit to Supine: 1: +1 Total assist Sit to Supine - Details (indicate cue type and reason): Pt=0%  Static Sitting Balance Static Sitting - Balance Support: Bilateral upper extremity supported;Feet unsupported Static Sitting - Level of Assistance: 1: +1 Total assist Static Sitting - Comment/# of Minutes: 5 minutes,  pt leaning heavily posteriorly and to rt. Exercise    End of Session PT - End of Session Activity Tolerance: Other (comment) (Limited by cognition ) Patient left: in bed General Behavior During Session: Lethargic Cognition: Impaired, at baseline  Nasri Boakye 12/02/2011, 1:22 PM  [facility] PT [phone]

## 2011-12-03 LAB — CULTURE, BLOOD (ROUTINE X 2)
Culture  Setup Time: 201303301326
Culture  Setup Time: 201303301326
Culture: NO GROWTH

## 2011-12-03 MED ORDER — SODIUM CHLORIDE 0.9 % IV SOLN
INTRAVENOUS | Status: DC
  Administered 2011-12-03: 15:00:00 via INTRAVENOUS

## 2011-12-03 NOTE — Progress Notes (Signed)
CSW arranged for discharge on Saturday to [facility] due to palliative meeting at 6pm on 12/03/2011. CSW submitted discharge summary and fl2 to facility. Facility is expecting patient on Saturday afternoon.   .Clinical social worker continuing to follow pt to assist with pt dc plans and further csw needs.   Catha Gosselin, Theresia Majors  [phone] .12/03/2011 1643pm

## 2011-12-03 NOTE — Discharge Summary (Signed)
Patient ID: Andrea Koch MRN: [id number] DOB/AGE: [birth year] 76 y.o.  Admit date: 11/27/2011 Discharge date: 12/04/2011  Primary Care Physician:  Provider Not In System  Discharge Diagnoses:    Principal Problem:  *Hypothermia Active Problems:  Atrial fibrillation  Sinoatrial node dysfunction  Dementia  Tachycardia-bradycardia syndrome  Diastolic dysfunction  Dacryoadenitis of left lacrimal gland  Hyponatremia  Sacral decubitus ulcer, stage III  Poor hygiene  Cachexia  Hypothyroidism   Medication List  As of 12/04/2011  9:30 AM   TAKE these medications         clonazePAM 0.5 MG tablet   Commonly known as: KLONOPIN   Take 0.25-0.5 mg by mouth at bedtime as needed. For anxiety / sleep      digoxin 0.125 MG tablet   Commonly known as: LANOXIN   Take 1 tablet (125 mcg total) by mouth daily.      furosemide 20 MG tablet   Commonly known as: LASIX   Take 20 mg by mouth daily as needed. For leg swelling      levothyroxine 25 MCG tablet   Commonly known as: SYNTHROID, LEVOTHROID   Take 1 tablet (25 mcg total) by mouth daily before breakfast.      metoprolol tartrate 25 MG tablet   Commonly known as: LOPRESSOR   Take 12.5-25 mg by mouth 2 (two) times daily. if lower then 110 hold medication            Consults:   Dr. Karleen Hampshire, opthalmology  Palliative Consult (12/03/11) Wound care RN  CCS   Brief H and P: From the admission note:  Andrea Koch is a 78 y.o. female, with advanced dementia, atrial fibrillation, sick sinus syndrome who lives with her son, was brought in after son noted that her blood pressure was high according to the son they had a family member visiting who had a blood pressure instrument he proceeded with checking her blood pressure and found it to be high and called EMS for patient to be brought to the hospital.  Upon arrival to the hospital patient was noted to to be cachectic, hypothermic, extremely poor hygiene, hypotensive tachycardic  hyponatremic with multiple skin tears, left lacrimal gland infection, cellulitis in her sacral region.  The patient was admitted to the step down unit.   Hospital Course:   1.  Hypothermia.  Warming blankets were placed on the patient and her temperature re-bounded within the first several hours and the patient's blood pressure decreased.  2.  Hyponatremia.  Likely secondary to very poor PO intake.  Serum sodium improved somewhat with IV saline and re-initiation of lasix.  Urine osmolality was 520 (wnl) and Urine sodium was 33.  This appeared to indicate poor PO intake.  The patient is a full assist with all ADLs and eating.  It is felt that she was likely not receiving proper PO intake prior to admission.  3.  Hypotension.  After being warmed the patients blood pressure dropped into the 80s.  All blood pressure medications were stopped.  With IVF her BP rose slightly but remained soft thru out her hospital stay.  4.  Tachy Brady Syndrome and Atrial Fibrillation.  The patient is currently paced.  She is on digoxin and dig level is therapeutic.  Metoprolol was d/c'd this admission.  She is not currently on coumadin and is not a coumadin candidate.  5.  Dacryocystitis of the left lacrimal gland.  Post bedside Nasolacrimal duct Probing and irrigation with topical anesthetic by Dr  Spencer on 11/28/11, Cipro eye drops were continued regularly and the patient's eye appeared to improve.  6. Dysphasia  SLP evaluated.  Patient would swallow only when verbally queued.  Speech recommended:  Full assist with eating, being fed only when alert and willing to accept POs.  Dyphagia 1 (pureed) with thin liquids.  Give meds crushed in puree.  Aspiration and reflux precautions to be strictly followed.  7. Malnutrition/Severe protein calorie malnutrition  Ensure pudding.   8. Sacral decubitus ulcer, stage III  Treated with IV Zosyn for 6 days.  Wound Care RN applied silicone foam dressings to sacral pressure  ulcers, to be changed every 3 days and PRN soilage. Pt needs air overlay at time of discharge per WOC recommendations.  9. Elevated liver enzymes  Etiology unclear.  HIDA scan on 11/28/2011 shows patent cystic duct without evidence for acute cholecystitis. Ultrasound on 11/27/2011 showed distended gallbladder with thickened wall and pericholecystic fluid suggestive of cholecystitis.  HIDA negative- CCS was consulted and signed off after negative HIDA scan.  Negative hepatitis panel.   10. Anemia of chronic disease  Hemoglobin stable. Serum iron 78, TIBC 154, TIBC 232, ferritin 333, panel done on November 27, 2011.   11. Hypothyroidism  TSH elevated, free T4 0.72 (low)  Started on Synthroid PO 25 meq this admission will need follow up TSH / Free T4 in 6 weeks.  12. Escherichia coli UTI At the time of admission urinalysis consistent with UTI. Patient was already on broad-spectrum antibiotic. Treated with Zosyn 6 days  13. CODE STATUS  DNR/DNI    Physical Exam on Discharge: Gen: Very frail, cachetic elderly female in NAD, who speaks very little. Cardiovascular: S1S2 RRR, no m/r/g  Respiratory: decreased inspiratory effort but CTAB, no w/r/c, no increased wob  Gastrointestinal: abdomen soft, seemingly NT, BS+  Extremities: bruising but no LE edema, Anasarca in arms.   Filed Vitals:   12/04/11 0537 12/04/11 0658 12/04/11 0703 12/04/11 0900  BP: 85/51 84/54 93/46    Pulse:    64  Temp:      TempSrc:      Resp:      Height:      Weight:      SpO2:         Intake/Output Summary (Last 24 hours) at 12/04/11 0929 Last data filed at 12/04/11 0600  Gross per 24 hour  Intake 798.34 ml  Output    225 ml  Net 573.34 ml    Basic Metabolic Panel:  Lab 12/04/11 1610 12/02/11 0830  NA 130* 129*  K 3.4* 3.9  CL 96 96  CO2 26 27  GLUCOSE 91 73  BUN 24* 9  CREATININE 0.58 0.36*  CALCIUM 7.7* 8.0*  MG -- --  PHOS -- --   Liver Function Tests:  Lab 11/30/11 0433 11/29/11 0400    AST 41* 53*  ALT 61* 69*  ALKPHOS 175* 197*  BILITOT 0.4 0.4  PROT 4.7* 4.6*  ALBUMIN 2.2* 2.2*   CBC:  Lab 11/30/11 0433 11/29/11 0400  WBC 7.9 8.7  NEUTROABS -- --  HGB 10.5* 10.0*  HCT 29.1* 28.5*  MCV 88.2 88.5  PLT 208 168   Cardiac Enzymes:  Lab 11/28/11 0425 11/27/11 1742  CKTOTAL 82 117  CKMB 4.6* 7.1*  CKMBINDEX -- --  TROPONINI <0.30 <0.30   CBG: No results found for this basename: GLUCAP:6 in the last 168 hours Thyroid Function Tests:  Lab 11/28/11 0805 11/27/11 1350 11/27/11 0940  TSH -- 11.780* --  T4TOTAL -- -- 8.4  FREET4 0.72* -- --  T3FREE -- -- --  THYROIDAB -- -- --    Anemia Panel:  Lab 11/27/11 1058  VITAMINB12 556  FOLATE 6.7  FERRITIN 333*  TIBC 232*  IRON 78  RETICCTPCT 0.8    Micro Results:  Blood cultures - no growth final.  Urine culture 9000 colonies of e-coli.    Significant Diagnostic Studies:  Dg Chest 1 View  11/27/2011  *RADIOLOGY REPORT*  Clinical Data: Dimension, CHF, atrial fibrillation, elevated blood pressure  CHEST - 1 VIEW  Comparison: June 17, 2010  Findings: The cardiac silhouette, mediastinum, pulmonary vasculature are within normal limits.  Pacemaker leads are intact and stable, in good position.  Both lungs are clear.  IMPRESSION: Stable chest x-ray with no evidence of acute cardiac or pulmonary process.  Original Report Authenticated By: Brandon Melnick, M.D.   Ct Head Wo Contrast  11/27/2011  *RADIOLOGY REPORT*  Clinical Data: Mental status change  CT HEAD WITHOUT CONTRAST  Technique:  Contiguous axial images were obtained from the base of the skull through the vertex without contrast.  Comparison: 08/05/2011  Findings: There is diffuse patchy low density throughout the subcortical and periventricular white matter consistent with chronic small vessel ischemic change.  There is prominence of the sulci and ventricles consistent with brain atrophy.  There is no evidence for acute brain infarct, hemorrhage or  mass.  The mastoid air cells and paranasal sinuses are clear.  The skull appears intact.  IMPRESSION:  1.  Small vessel ischemic change and brain atrophy. 2.  No acute intracranial abnormalities.  Original Report Authenticated By: Rosealee Albee, M.D.   Nm Hepatobiliary Liver Func  11/28/2011  *RADIOLOGY REPORT*  Clinical Data:  Evaluate cholecystitis as questioned on ultrasound  NUCLEAR MEDICINE HEPATOBILIARY IMAGING  Technique:  Sequential images of the abdomen were obtained out to 60 minutes following intravenous administration of radiopharmaceutical.  Radiopharmaceutical:  Tc-35m Choletec  Comparison:  None.  Findings:   Following the intravenous administration of the radiopharmaceutical there is uniform uptake by the liver with clearance from blood pool.  Radiotracer accumulation within the gallbladder is identified by 20 minutes.  Radiotracer activity within the common bile duct and small bowel is noted within 10 minutes.  IMPRESSION:  1.  Patent cystic duct without evidence for acute cholecystitis.  Original Report Authenticated By: Rosealee Albee, M.D.   US Abdomen Complete  11/27/2011  *RADIOLOGY REPORT*  Clinical Data:  Unresponsive, elevated LFTs  COMPLETE ABDOMINAL ULTRASOUND  Comparison:  None.  Findings:  Gallbladder:  Is distended with a striated thickened wall measuring up to 4 mm in thickness.  There is layering sludge in the lumen. Equivocal sonographic Murphy's sign.  There is a trace amount of pericholecystic fluid.  Common bile duct:  4 mm diameter, unremarkable.  Liver:  No focal lesion identified.  Within normal limits in parenchymal echogenicity.  IVC:  Appears normal.  Pancreas:  Largely obscured by overlying bowel gas.  Spleen:  8.4 cm craniocaudal length, unremarkable  Right Kidney:  9.7 cm, isoechoic to the adjacent liver.  No focal lesion or hydronephrosis.  Left Kidney:  8.3 cm. No hydronephrosis.  Well-preserved cortex. Normal size and parenchymal echotexture without  focal abnormalities.  Abdominal aorta:  Atheromatous calcifications without aneurysm.  Note made of a right pleural effusion.  IMPRESSION:  1. Distended gallbladder with thickened   wall and pericholecystic fluid, suggestive of cholecystitis.  If there is continued clinical uncertainty, hepatobiliary  scintigraphy may be useful. 2.  Small right pleural effusion.  Original Report Authenticated By: Osa Craver, M.D.      Disposition and Follow-up: patient will need a BMP on Monday 4/8 to check coumadin level and monitor sodium and potassium.  She will need a follow up TSH / Free T4 in 6 weeks.  Discharge Orders    Future Orders Please Complete By Expires   Diet - low sodium heart healthy      Increase activity slowly        Follow-up Information    Follow up with Provider at [facility] in next 24 - 48 hours.   Patient needs air overlay mattress at [facility] due to Sacral Decubitus Ulcerations.       Full Assist with all ADL and eating.  +Aspirations precautions, +Gerd precautions.     Time spent on Discharge: 40  Min.  SignedClydia Llano A 12/04/2011, 9:29 AM [phone]   Clint Lipps Pager: [phone] 12/04/2011, 9:29 AM

## 2011-12-03 NOTE — Progress Notes (Signed)
Addendum  Patient seen and examined, chart and data base reviewed.  I agree with the above assessment and plan  For full details please see Mrs. Cordelia Pen note.  For SNF, when bed available. Consult palliative medicine team.  Clint Lipps Pager: [phone] 12/03/2011, 10:54 AM

## 2011-12-03 NOTE — Progress Notes (Signed)
Patient ID: RON JUNCO, female   DOB: 1932/06/06, 78 y.o.   MRN: [id number]  PATIENT DETAILS Name: Andrea Koch Age: 78 y.o. Sex: female Date of Birth: [birth year] Admit Date: 11/27/2011 WUJ:[id number] Not In System POA:   CONSULTS: Dr. Karleen Hampshire, opthalmology  Palliative Consult  Wound care RN CCS   Interim history:  Talked with Zeb Comfort this am.  He is definitely willing to meet with Palliative care this evening at 6 pm.  He understands that while his mother may pick back up and thrive, she likely is in the last several months of her life.  He mentioned that she would not want ACLS or intubation if needed.  Subjective: Patient being fed small bites of orange ice cream.  Not really swallowing.  Answers Yes or No to my questions.  Objective: Vital signs in last 24 hours: Temp:  [97 F (36.1 C)-98.1 F (36.7 C)] 98.1 F (36.7 C) (04/05 0501) Pulse Rate:  [68-82] 82  (04/05 0501) Resp:  [15-18] 18  (04/05 0501) BP: (86-118)/(57-70) 118/62 mmHg (04/05 0501) SpO2:  [93 %-97 %] 97 % (04/05 0501) Weight:  [58.9 kg (129 lb 13.6 oz)-60.3 kg (132 lb 15 oz)] 58.9 kg (129 lb 13.6 oz) (04/05 0501) Weight change: -0.4 kg (-14.1 oz) Last BM Date: 12/02/11  Intake/Output from previous day:  Intake/Output Summary (Last 24 hours) at 12/03/11 0707 Last data filed at 12/03/11 0500  Gross per 24 hour  Intake    660 ml  Output   1000 ml  Net   -340 ml     Physical Exam:  Gen:  Very frail, cachetic elderly female in NAD Cardiovascular:  S1S2 RRR, no m/r/g Respiratory: decreased inspiratory effort but CTAB, no w/r/c, no increased wob Gastrointestinal: abdomen soft, seemingly NT, BS+ Extremities: bruising but no LE edema, Anasarca in arms.   Lab Results:  Lab 11/30/11 0433 11/29/11 0400 11/28/11 0423  HGB 10.5* 10.0* 10.1*  HCT 29.1* 28.5* 28.6*  WBC 7.9 8.7 10.1  PLT 208 168 153     Lab 12/02/11 0830 11/30/11 0433 11/29/11 0400 11/28/11 0423 11/27/11 0747  NA  129* 128* 129* 124* 124*  K 3.9 4.2 -- -- --  CL 96 99 100 93* 88*  CO2 27 22 22 23 26   GLUCOSE 73 84 53* 85 74  BUN 9 20 12 14 17   CREATININE 0.36* 0.45* 0.42* 0.41* 0.36*  CALCIUM 8.0* 8.0* 8.1* 8.2* 8.3*  MG -- -- -- -- --  PHOS -- -- -- -- --    Studies/Results: Dg Chest 1 View  11/27/2011 *RADIOLOGY REPORT* Clinical Data: Dimension, CHF, atrial fibrillation, elevated blood pressure CHEST - 1 VIEW Comparison: June 17, 2010 Findings: The cardiac silhouette, mediastinum, pulmonary vasculature are within normal limits. Pacemaker leads are intact and stable, in good position. Both lungs are clear. IMPRESSION: Stable chest x-ray with no evidence of acute cardiac or pulmonary process. Original Report Authenticated By: Brandon Melnick, M.D.   Ct Head Wo Contrast  11/27/2011 *RADIOLOGY REPORT* Clinical Data: Mental status change CT HEAD WITHOUT CONTRAST Technique: Contiguous axial images were obtained from the base of the skull through the vertex without contrast. Comparison: 08/05/2011 Findings: There is diffuse patchy low density throughout the subcortical and periventricular white matter consistent with chronic small vessel ischemic change. There is prominence of the sulci and ventricles consistent with brain atrophy. There is no evidence for acute brain infarct, hemorrhage or mass. The mastoid air cells and paranasal sinuses are clear. The  skull appears intact. IMPRESSION: 1. Small vessel ischemic change and brain atrophy. 2. No acute intracranial abnormalities. Original Report Authenticated By: Rosealee Albee, M.D.   Nm Hepatobiliary Liver Func  11/28/2011 *RADIOLOGY REPORT* Clinical Data: Evaluate cholecystitis as questioned on ultrasound NUCLEAR MEDICINE HEPATOBILIARY IMAGING Technique: Sequential images of the abdomen were obtained out to 60 minutes following intravenous administration of radiopharmaceutical. Radiopharmaceutical: Tc-52m Choletec Comparison: None. Findings: Following the  intravenous administration of the radiopharmaceutical there is uniform uptake by the liver with clearance from blood pool. Radiotracer accumulation within the gallbladder is identified by 20 minutes. Radiotracer activity within the common bile duct and small bowel is noted within 10 minutes. IMPRESSION: 1. Patent cystic duct without evidence for acute cholecystitis. Original Report Authenticated By: Rosealee Albee, M.D.   US Abdomen Complete  11/27/2011 *RADIOLOGY REPORT* Clinical Data: Unresponsive, elevated LFTs COMPLETE ABDOMINAL ULTRASOUND Comparison: None. Findings: Gallbladder: Is distended with a striated thickened wall measuring up to 4 mm in thickness. There is layering sludge in the lumen. Equivocal sonographic Murphy's sign. There is a trace amount of pericholecystic fluid. Common bile duct: 4 mm diameter, unremarkable. Liver: No focal lesion identified. Within normal limits in parenchymal echogenicity. IVC: Appears normal. Pancreas: Largely obscured by overlying bowel gas. Spleen: 8.4 cm craniocaudal length, unremarkable Right Kidney: 9.7 cm, isoechoic to the adjacent liver. No focal lesion or hydronephrosis. Left Kidney: 8.3 cm. No hydronephrosis. Well-preserved cortex. Normal size and parenchymal echotexture without focal abnormalities. Abdominal aorta: Atheromatous calcifications without aneurysm. Note made of a right pleural effusion. IMPRESSION: 1. Distended gallbladder with thickened wall and pericholecystic fluid, suggestive of cholecystitis. If there is continued clinical uncertainty, hepatobiliary scintigraphy may be useful. 2. Small right pleural effusion. Original Report Authenticated By: Osa Craver, M.D.    Medications: Scheduled Meds:    . ciprofloxacin  2 drop Left Eye BID  . digoxin  0.125 mg Oral Daily  . digoxin  0.25 mg Oral Once  . feeding supplement  237 mL Oral TID BM  . feeding supplement  1 Container Oral TID BM  . furosemide  20 mg Oral Daily  .  levothyroxine  25 mcg Oral QAC breakfast  . piperacillin-tazobactam (ZOSYN)  IV  3.375 g Intravenous Q8H  . sodium chloride  3 mL Intravenous Q12H  . DISCONTD: digoxin  0.125 mg Intravenous Daily  . DISCONTD: digoxin  0.25 mg Oral Once   Continuous Infusions:  PRN Meds:.albuterol, guaiFENesin-dextromethorphan, HYDROcodone-acetaminophen, ondansetron (ZOFRAN) IV, ondansetron Antibiotics: Anti-infectives     Start     Dose/Rate Route Frequency Ordered Stop   11/27/11 1400   piperacillin-tazobactam (ZOSYN) IVPB 3.375 g        3.375 g 12.5 mL/hr over 240 Minutes Intravenous 3 times per day 11/27/11 1327             Assessment/Plan:   Hypothermia  Resolved, used warming blankets.    Dehydration  Suspected to be from poor by mouth intake. Lasix restarted on 4/3   Hyponatremia (124 on admission)  Likely due to dehydration.  improved with IV hydration. Monitor with po lasix  History of Atrial fibrillation, Sinoatrial node dysfunction, Tachycardia-bradycardia syndrome, chronic Diastolic dysfunction, status post pacemaker placement.  Currently the patient is paced. Continue digoxin.  Per son patient is due for her interrogation of her pacemaker, I have contacted [facility] cardiology for consideration of interrogating her pacemaker.   Hypotension.  Unless rate becomes uncontrolled, will hold off on Metoprolol due to hypotension.  Patient is hypotensive today (  4/5).  On no BP meds. Will restart gentle IV Fluids.  she is allergic to aspirin.    Dacryocystitis of left lacrimal gland  Post bedside Nasolacrimal duct Probing and irrigation with topical anesthetic by Dr Karleen Hampshire on 11/28/11, continue Cipro eye drops, improved.    Dysphasia  SLP continues to follow. Continue dysphagia I diet with thin liquids    Malnutrition/Severe protein calorie malnutrition  Ensure pudding.     Sacral decubitus ulcer, stage III  silicone foam dressings to sacral pressure ulcers, to be changed every 3 days and  PRN soilage. Pt needs air overlay if dc to home or to SNF per WOC recommendations; Day 6 of zosyn (see below) d/c'd.   Elevated liver enzymes  etiology unclear  HIDA scan on 11/28/2011 shows patent cystic duct without evidence for acute cholecystitis. Ultrasound on 11/27/2011 showed distended gallbladder with thickened wall and pericholecystic fluid suggestive of cholecystitis.  HIDA negative- surgery has signed off.  Negative hepatitis panel.    Anemia of chronic disease  Hemoglobin stable. Serum iron 78, TIBC 154, TIBC 232, ferritin 333, panel done on November 27, 2011.   Hypothyroidism  TSH elevated, free T4 0.72 (low)  On Synthroid PO 25   CODE STATUS  DNR/DNI  E.Coli UTI:  on Zosyn D6. Can likely d/c after 4/5 dose - D/C'd  Dispo:  Pt is extremely high risk for recurrent hospitalizations. Would like to establish goals of care before discharge to help minimize readmissions.   Palliative Medicine Consults cannot be done at [facility]. Palliative Medicine Consult this evening at 6 pm.  Will D/C to [facility] on Sat.  Algis Downs, PA-C [facility] Pager: [phone]     LOS: 6 days    12/03/2011, 7:07 AM

## 2011-12-03 NOTE — Progress Notes (Signed)
Addendum  Patient seen and examined, chart and data base reviewed.  I agree with the above assessment and plan  For full details please see Mrs. Algis Downs PA. Note.  Probable discharge to [facility] on Saturday.  Establish goals of care for health care today by palliative medicine.  Clint Lipps Pager: [phone] 12/03/2011, 3:27 PM

## 2011-12-03 NOTE — Progress Notes (Signed)
Notified by Algis Downs, PA she has spoken with patient's son Andrea Koch at work number [phone] and he is agreeable to meeting with Palliative Medicine Team tonight and requests 6 pm - as Andrea Koch is at work this [employer] fundraiser did not re-contact him; Algis Downs, [place] aware and confirmed with PMT  meeting scheduled for today Friday, 12/03/11 @ 6:00 pm    Valente David, RN 12/03/2011, 9:03 AM Palliative Medicine Team RN Liaison [phone]

## 2011-12-03 NOTE — Progress Notes (Signed)
   CARE MANAGEMENT NOTE 12/03/2011  Patient:  Andrea Koch, Andrea Koch   Account Number:  [id number]  Date Initiated:  12/02/2011  Documentation initiated by:  Letha Cape  Subjective/Objective Assessment:   dx hypothermia, hyponatremia  admit- lives with children.     Action/Plan:   pt eval- rec snf.    palliative care meeting 4/5 at 6 pm   Anticipated DC Date:  12/03/2011   Anticipated DC Plan:  SKILLED NURSING FACILITY  In-house referral  Clinical Social Worker      DC Planning Services  CM consult      Choice offered to / List presented to:             Status of service:  In process, will continue to follow Medicare Important Message given?   (If response is "NO", the following Medicare IM given date fields will be blank) Date Medicare IM given:   Date Additional Medicare IM given:    Discharge Disposition:    Per UR Regulation:    If discussed at Long Length of Stay Meetings, dates discussed:    Comments:  12/03/11 15:17 Letha Cape RN, BSN [phone] palliative care meeting today at 6 pm for goals of care.  12/02/11 15:36 Letha Cape RN, BSN [phone] discharge plan is for snf, CSW following, NA is 128, if improves may possibly dc today or tomorrow.

## 2011-12-04 LAB — BASIC METABOLIC PANEL
BUN: 24 mg/dL — ABNORMAL HIGH (ref 6–23)
CO2: 26 mEq/L (ref 19–32)
Chloride: 96 mEq/L (ref 96–112)
GFR calc non Af Amer: 87 mL/min — ABNORMAL LOW (ref >90)
Glucose, Bld: 91 mg/dL (ref 70–99)
Potassium: 3.4 mEq/L — ABNORMAL LOW (ref 3.5–5.1)
Sodium: 130 mEq/L — ABNORMAL LOW (ref 135–145)

## 2011-12-04 MED ORDER — SODIUM CHLORIDE 0.9 % IV BOLUS (SEPSIS)
500.0000 mL | Freq: Once | INTRAVENOUS | Status: AC
  Administered 2011-12-04: 500 mL via INTRAVENOUS

## 2011-12-04 MED ORDER — LEVOTHYROXINE SODIUM 25 MCG PO TABS: 25.0000 ug | ORAL_TABLET | Freq: Every day | ORAL | Status: DC

## 2011-12-04 NOTE — Progress Notes (Signed)
Rectal temp 101.1 excess blankets removed repeat temp 100.3 rectal. Dr. Arthor Captain notified, no new orders. Will continue to monitor and assist as needed. Julien Nordmann [facility]

## 2011-12-04 NOTE — Progress Notes (Signed)
Andrea Koch discharged Nursing Home per MD order.  Foley patent and intact, remains in place per Palliative recommendations (Dr. Ladona Ridgel)   Andrea Koch, Andrea Koch  Home Medication Instructions [id number]   Printed on:12/04/11 1716  Medication Information                    metoprolol tartrate (LOPRESSOR) 25 MG tablet Take 12.5-25 mg by mouth 2 (two) times daily. if lower then 110 hold medication           furosemide (LASIX) 20 MG tablet Take 20 mg by mouth daily as needed. For leg swelling           clonazePAM (KLONOPIN) 0.5 MG tablet Take 0.25-0.5 mg by mouth at bedtime as needed. For anxiety / sleep           digoxin (LANOXIN) 0.125 MG tablet Take 1 tablet (125 mcg total) by mouth daily.           levothyroxine (SYNTHROID, LEVOTHROID) 25 MCG tablet Take 1 tablet (25 mcg total) by mouth daily before breakfast.             Patients skin is clean and dry wounds measured previously this shift see prior note. Redness to groin and buttocks  with microguard powder in place.  IV site discontinued and catheter remains intact. Site without signs and symptoms of complications. Dressing and pressure applied.  Patient escorted to SNF by PTAR on stretcher no distress noted on discharge.  Andrea Koch [facility] 12/04/2011 5:16 PM

## 2011-12-04 NOTE — Progress Notes (Addendum)
Patients medial sacral ulcer is 2 x 1 that's 25% black and 75% yellow. To left of the ulcer is a DTI and to left of the DTI is another stage II measuring 1 x 0.5 with a pink wound bed. Skin around ulcer and DTI is red and intact. Area cleansed with soap and water and an alleyvn reapplied. Julien Nordmann [facility]

## 2011-12-04 NOTE — Progress Notes (Addendum)
Clinical Social Work:  Weekend Coverage  Patient to [employer] today to [employer]. Patient will transport by EMS.  Per MD, patient is ready and family aware, no changes after Pallative care meeting.    Son was called twice, however could not be reached.  Will continue to reach son.  Patient was in room eating her breakfast with nursing staff and EMS will be arranged after breakfast.  Will contact facility as well for dc.  No other needs at this time.  CSW will sign off.  Gurdeep Keesey Nail, MSW LCSW [phone]  At 11:00 am finally reached patient son Leonette Most who reports he is at work until 3:00 today.  He will be over at [facility] around 3:30 to complete necessary paperwork.  CSW is not allowed to arrange EMS until 3:00 for patient due to patient son not being able to get there in time.  Will arrange EMS and patient will be discharged.  No other needs at this time. HN [phone]

## 2011-12-04 NOTE — Consult Note (Signed)
Late entry for 12/03/2011 Consult Note from the Palliative Medicine Team at [facility] Patient ZO:XWRUEA B Locy      DOB: 1932-05-17      VWU:[id number]   Consult Requested by: Dr. Arthor Captain   PCP: Provider Not In System Reason for Consultation:GOC    Phone Number:None  1. Assessment and Plan: 78 year old with advanced dementia admitted with electrolyte derangement, hypothermia, extreme failure to thrive, an eye infection, sacral decubitus.  Found to have a UTI.  Son and daughter in law have tried to take the best care they can of her.  I discussed the trajectory of her advanced alzheimer's.  They internalized from other practitioners that she is at end of life.  Will they understand that she will likely pass away in the next few weeks to months if she continues along this trajectory they have limited insight into means that would keep her comfortable.  At this time, they are honoring her expressed wish to be a DNR but have a hard time thinking about not putting a feeding tube in or not returning to the hospital for antibiotics.  The following reflect current goals of care. 2. Code Status: DNR/DNI 3. Symptom Control: 1. Pain : overall they desire reasonable pain control.  At this time she does not feel uncomfortable but with her decub, she may need meds for dressing change.  Patient would do well with Roxanol 5 mg (20mg /ml solution 30 ml dispense)q 4 prn for pain if she is unable to take the [facility] tablet.  Local care and air mattress will also help 2. Agitation:   At this time this is limited.  Prn ativan may be helpful in the future but for now is not necessary. 3. Failure to thrive:  Continue to comfort feed best texture.  Family may insist in the future on feeding tube if she stops eating .  I would recommend prompt palliative care consultation if she returns to the hospital to discuss further goals of care. 4. Family would desire future antibiotics if deemed appropriate. 4. Psycho/Social: The  patient has lived with her son and daughter in law for many years.  They both have limited health literacy, and her daughter in law has serious health issues herself.  Prior to the advancement of her dementia, Melayah liked to sew and read. 5. Spiritual:  I was unable to address this need as the family was anxious to leave and would not allow a true sit down meeting, we stood at the door the whole time on their request. 6. Disposition: Prognosis poor likely weeks to months. Patient would benefit from Hospice under her dementia diagnosis.  He son states he is pursing Medicaid for her.  Unfortunately ,  PCS does not go to [facility] .  Difficult case as family with difficulty understanding patients current prognosis in detail.  Some resistance to full conversation.  Patient Documents Completed or Given: Document Given Completed  Advanced Directives Pkt    MOST    DNR    Gone from My Sight    Hard Choices      Brief HPI: admitted with failure to thrive at home.  Found to be in very poor physical condition.   ROS: unable do to mental status    PMH:  Past Medical History  Diagnosis Date  . Dementia   . HTN (hypertension)   . Atrial fibrillation     paroxysmal  . Tachycardia-bradycardia syndrome     s/p PPM by Dr  Tennant  . GI bleed 2/12    ulcer to ASA  . Diastolic dysfunction   . CHF (congestive heart failure)   . Pacemaker   . Coronary artery disease      PSH: Past Surgical History  Procedure Date  . Pacemaker insertion 09/02/09    by Dr Deborah Chalk due to tachycardia bradycardia syndrome and syncope (MDT)   I have reviewed the FH and SH and  If appropriate update it with new information. Allergies  Allergen Reactions  . Aspirin Other (See Comments)    Ulcer - bleeding  . Sulfonamide Derivatives    Scheduled Meds:   . ciprofloxacin  2 drop Left Eye BID  . digoxin  0.125 mg Oral Daily  . feeding supplement  237 mL Oral TID BM  . feeding supplement  1 Container Oral TID  BM  . furosemide  20 mg Oral Daily  . levothyroxine  25 mcg Oral QAC breakfast  . sodium chloride  500 mL Intravenous Once  . sodium chloride  3 mL Intravenous Q12H  . DISCONTD: piperacillin-tazobactam (ZOSYN)  IV  3.375 g Intravenous Q8H   Continuous Infusions:   . sodium chloride 50 mL/hr at 12/03/11 1522   PRN Meds:.albuterol, guaiFENesin-dextromethorphan, HYDROcodone-acetaminophen, ondansetron (ZOFRAN) IV, ondansetron    BP 93/46  Pulse 65  Temp(Src) 99.5 F (37.5 C) (Axillary)  Resp 18  Ht 5' 6.93" (1.7 m)  Wt 58.5 kg (128 lb 15.5 oz)  BMI 20.24 kg/m2  SpO2 93%   PPS:20% at best   Intake/Output Summary (Last 24 hours) at 12/04/11 0717 Last data filed at 12/04/11 0600  Gross per 24 hour  Intake 1146.34 ml  Output    225 ml  Net 921.34 ml   LBM: 4/5 13                        Physical Exam:  General: extremely cachectic female , who can not articulate needs HEENT:  PERRL, EOM appear intact but are difficult to assess Chest:  Decreased, occasional rhonchi CVS: regular, no murmur noted PM in place Abdomen: schaphoid not tender Ext: trace edema, warm not mottled Neuro: not oriented to time place or person,  Barely interacting  Labs: CBC    Component Value Date/Time   WBC 7.9 11/30/2011 0433   RBC 3.30* 11/30/2011 0433   HGB 10.5* 11/30/2011 0433   HCT 29.1* 11/30/2011 0433   PLT 208 11/30/2011 0433   MCV 88.2 11/30/2011 0433   MCH 31.8 11/30/2011 0433   MCHC 36.1* 11/30/2011 0433   RDW 14.4 11/30/2011 0433   LYMPHSABS 1.5 11/27/2011 0747   MONOABS 0.3 11/27/2011 0747   EOSABS 0.1 11/27/2011 0747   BASOSABS 0.0 11/27/2011 0747     Crt 0.36,  NA 129,  Dig 0.6  Hepatitis panel negative  Chest Xray Reviewed/Impressions: Negative  Hepatobiliary scan : negative    Time In Time Out Total Time Spent with Patient Total Overall Time  640 pm  725 pm 45 min 45 min    Greater than 50%  of this time was spent counseling and coordinating care related to the above assessment  and plan.  Tydarius Yawn L. Ladona Ridgel, MD MBA The Palliative Medicine Team at [facility] Phone: [phone] Pager: [phone]

## 2011-12-04 NOTE — Progress Notes (Signed)
Patient was resting comfortably when I arrived to see her this a.m. a most form was placed on the chart for signature but the son when she arrives at [place]. He could not form was also provided. Recommendations are for continued nutritional support, when necessary as needed benzodiazepines he be prescribed and if she is unable to take her pain medication by tablet Roxanol would be a good choice. I do not believe that the patient is eligible for hospice at this time based on her financial situation, however she would benefit from the standpoint of her diagnosis of dementia. Unfortunately, palliative care services are not available at [facility]. We would be glad to see the patient if she returns to the hospital. The patient is a high returned hospital risk. Case was reviewed with Dr. Arthor Captain.  Exam remains unchanged  Total time 15 minutes Henrick Mcgue L. Ladona Ridgel, MD MBA The Palliative Medicine Team at [facility] Phone: [phone] Pager: [phone]

## 2011-12-04 NOTE — Progress Notes (Signed)
Text paged NP on call. Pts BP 85/57. New orders given. Andrea Koch E

## 2011-12-06 NOTE — Progress Notes (Signed)
   CARE MANAGEMENT NOTE 12/06/2011  Patient:  Andrea Koch, Andrea Koch   Account Number:  [id number]  Date Initiated:  12/02/2011  Documentation initiated by:  Letha Cape  Subjective/Objective Assessment:   dx hypothermia, hyponatremia  admit- lives with children.     Action/Plan:   pt eval- rec snf.    palliative care meeting 4/5 at 6 pm   Anticipated DC Date:  12/03/2011   Anticipated DC Plan:  SKILLED NURSING FACILITY  In-house referral  Clinical Social Worker      DC Planning Services  CM consult      Choice offered to / List presented to:             Status of service:  Completed, signed off Medicare Important Message given?   (If response is "NO", the following Medicare IM given date fields will be blank) Date Medicare IM given:   Date Additional Medicare IM given:    Discharge Disposition:  SKILLED NURSING FACILITY  Per UR Regulation:    If discussed at Long Length of Stay Meetings, dates discussed:    Comments:  12/03/11 15:17 Letha Cape RN, BSN [phone] palliative care meeting today at 6 pm for goals of care.  12/02/11 15:36 Letha Cape RN, BSN [phone] discharge plan is for snf, CSW following, NA is 128, if improves may possibly dc today or tomorrow.

## 2011-12-17 ENCOUNTER — Encounter: Payer: Self-pay | Admitting: Ophthalmology

## 2012-03-06 ENCOUNTER — Encounter: Payer: Self-pay | Admitting: Internal Medicine

## 2012-03-06 ENCOUNTER — Ambulatory Visit (INDEPENDENT_AMBULATORY_CARE_PROVIDER_SITE_OTHER): Payer: Medicare Other | Admitting: Internal Medicine

## 2012-03-06 VITALS — BP 106/60 | HR 70 | Resp 18 | Ht 65.0 in | Wt 115.0 lb

## 2012-03-06 DIAGNOSIS — R609 Edema, unspecified: Secondary | ICD-10-CM

## 2012-03-06 DIAGNOSIS — I495 Sick sinus syndrome: Secondary | ICD-10-CM

## 2012-03-06 DIAGNOSIS — F0390 Unspecified dementia without behavioral disturbance: Secondary | ICD-10-CM

## 2012-03-06 DIAGNOSIS — I10 Essential (primary) hypertension: Secondary | ICD-10-CM

## 2012-03-06 DIAGNOSIS — I4891 Unspecified atrial fibrillation: Secondary | ICD-10-CM

## 2012-03-06 LAB — PACEMAKER DEVICE OBSERVATION
AL AMPLITUDE: 2.8 mv
ATRIAL PACING PM: 62
BAMS-0001: 150 {beats}/min
BATTERY VOLTAGE: 2.78 V
RV LEAD THRESHOLD: 0.875 V
VENTRICULAR PACING PM: 0

## 2012-03-06 NOTE — Assessment & Plan Note (Signed)
Preserved EF Due to venous insufficiency  Support hose are again advised today  Caution with lasix to avoid intravascular dehydration

## 2012-03-06 NOTE — Assessment & Plan Note (Signed)
Normal pacemaker function See Pace Art report No changes today  

## 2012-03-06 NOTE — Assessment & Plan Note (Signed)
Stable No change required today  

## 2012-03-06 NOTE — Assessment & Plan Note (Signed)
Presently maintaining sinus rhythm and rate controlled She is not a candidate for coumadin or even ASA at this time due to recent GI bleeding.

## 2012-03-06 NOTE — Progress Notes (Signed)
Andrea Koch is a 78 y.o. female with a h/o paroxysmal atrial fibrillation and tachycardia/ bradycardia syndrome sp PPM (MDT) by Dr Deborah Chalk  who presents today for follow-up in the Electrophysiology device clinic.   The patient is frail and chronically ill.  She has advanced dementia.  She is DNI/DNR.   She developed acute GI bleed 2/12 for which she was found to have an ulcer due to ASA.  She is therefore felt to not be a candidate for anticoagulation.  She has stable edema.   Today, she  denies symptoms of palpitations, chest pain, orthopnea, PND, dizziness, presyncope, syncope, or neurologic sequela.  The patientis tolerating medications without difficulties and is otherwise without complaint today.   Past Medical History  Diagnosis Date  . Dementia     advanced  . HTN (hypertension)   . Atrial fibrillation     paroxysmal  . Tachycardia-bradycardia syndrome     s/p PPM by Dr Deborah Chalk  . GI bleed 2/12    ulcer to ASA  . Diastolic dysfunction   . CHF (congestive heart failure)   . Pacemaker   . Coronary artery disease     Past Surgical History  Procedure Date  . Pacemaker insertion 09/02/09    by Dr Deborah Chalk due to tachycardia bradycardia syndrome and syncope (MDT)    History   Social History  . Marital Status: Widowed    Spouse Name: N/A    Number of Children: N/A  . Years of Education: N/A   Occupational History  . Not on file.   Social History Main Topics  . Smoking status: Never Smoker   . Smokeless tobacco: Not on file  . Alcohol Use: No  . Drug Use: No  . Sexually Active: Not on file   Other Topics Concern  . Not on file   Social History Narrative  . No narrative on file    No family history on file.  Allergies  Allergen Reactions  . Aspirin Other (See Comments)    Ulcer - bleeding  . Sulfonamide Derivatives     Current Outpatient Prescriptions  Medication Sig Dispense Refill  . clonazePAM (KLONOPIN) 0.5 MG tablet Take 0.25-0.5 mg by mouth at  bedtime as needed. For anxiety / sleep      . digoxin (LANOXIN) 0.125 MG tablet Take 1 tablet (125 mcg total) by mouth daily.  30 tablet  5  . furosemide (LASIX) 20 MG tablet Take 20 mg by mouth daily as needed. For leg swelling      . levothyroxine (SYNTHROID, LEVOTHROID) 25 MCG tablet Take 1 tablet (25 mcg total) by mouth daily before breakfast.      . metoprolol tartrate (LOPRESSOR) 25 MG tablet Take 12.5-25 mg by mouth 2 (two) times daily. if lower then 110 hold medication        ROS- pt unable to provide due to dementia  Physical Exam: Filed Vitals:   03/06/12 1213  BP: 106/60  Pulse: 70  Resp: 18  Height: 5\' 5"  (1.651 m)  Weight: 115 lb (52.164 kg)  SpO2: 87%    GEN- The patient is thin, frail, and ill appearing, alert but confused.   Head- normocephalic, atraumatic, alopecia Eyes-  Sclera clear, conjunctiva pink Ears- hearing intact Oropharynx- clear Neck- supple, Lungs- Clear to ausculation bilaterally, normal work of breathing Chest- pacemaker pocket is well healed Heart- Regular rate and rhythm, no murmurs, rubs or gallops, PMI not laterally displaced GI- soft, NT, ND, + BS Extremities- no clubbing,  cyanosis, 2+ pitting edema MS-  Diffuse muscle atrophy, ambulating by wheelchair Skin- diffuse severe ecchymosis over arms Psych- pleasantly demented mood, full affect Neuro- strength and sensation are intact  Pacemaker interrogation- reviewed in detail today,  See PACEART report  Assessment and Plan:

## 2012-03-06 NOTE — Assessment & Plan Note (Signed)
Very advanced dementia She is DNR/DNI.  This is very appropriate. Her prognosis is poor.

## 2012-03-06 NOTE — Patient Instructions (Addendum)
Your physician wants you to follow-up in: 1 year with Dr Allred.  You will receive a reminder letter in the mail two months in advance. If you don't receive a letter, please call our office to schedule the follow-up appointment.  

## 2012-09-07 ENCOUNTER — Encounter: Payer: Self-pay | Admitting: Cardiology

## 2012-09-07 ENCOUNTER — Ambulatory Visit (INDEPENDENT_AMBULATORY_CARE_PROVIDER_SITE_OTHER): Payer: Medicare Other | Admitting: Cardiology

## 2012-09-07 ENCOUNTER — Encounter: Payer: Self-pay | Admitting: Internal Medicine

## 2012-09-07 VITALS — BP 124/66 | HR 75 | Ht 67.0 in | Wt 130.0 lb

## 2012-09-07 DIAGNOSIS — I495 Sick sinus syndrome: Secondary | ICD-10-CM

## 2012-09-07 DIAGNOSIS — Z950 Presence of cardiac pacemaker: Secondary | ICD-10-CM

## 2012-09-07 DIAGNOSIS — I4891 Unspecified atrial fibrillation: Secondary | ICD-10-CM

## 2012-09-07 LAB — PACEMAKER DEVICE OBSERVATION
AL AMPLITUDE: 5.6 mv
RV LEAD AMPLITUDE: 15.68 mv
RV LEAD IMPEDENCE PM: 576 Ohm
RV LEAD THRESHOLD: 1 V
VENTRICULAR PACING PM: 0.3

## 2012-09-07 NOTE — Progress Notes (Signed)
ELECTROPHYSIOLOGY OFFICE NOTE  Patient ID: Andrea Koch MRN: [id number], DOB/AGE: September 15, 1931   Date of Visit: 09/07/2012  Primary Physician: Attending MD at [facility] Primary Cardiologist: Hillis Range, MD Reason for Visit: EP/device follow-up  History of Present Illness  Andrea Koch is a 79 year old woman with paroxysmal atrial fibrillation and tachy-bradycardia syndrome sp PPM (MDT) by Dr Deborah Chalk who presents today for routine EP/device follow-up.   Andrea Koch has advanced dementia and is frail and chronically ill. She is DNI/DNR. She had an acute GI bleed Feb 2012 for which she was found to have an ulcer due to ASA. She is therefore felt to not be a candidate for anticoagulation.   Andrea Koch is somewhat agitated today. Her caregiver reports she becomes upset with loud or sudden noises/movement. Unfortunately, we have increased noise from our current construction/remodeling process which agitated her in the waiting room. She is becoming more calm in the room now away from our construction area. She is a poor informant and her history is limited, obtained from the caregiver. Her caregiver reports Andrea Koch has not complained of CP, SOB or palpitations. She has not experienced syncope.   Past Medical History Past Medical History  Diagnosis Date  . Dementia     advanced  . HTN (hypertension)   . Atrial fibrillation     paroxysmal  . Tachycardia-bradycardia syndrome     s/p PPM by Dr Deborah Chalk  . GI bleed 2/12    ulcer to ASA  . Diastolic dysfunction   . CHF (congestive heart failure)   . Pacemaker   . Coronary artery disease     Past Surgical History Past Surgical History  Procedure Date  . Pacemaker insertion 09/02/09    by Dr Deborah Chalk due to tachycardia bradycardia syndrome and syncope (MDT)     Allergies/Intolerances Allergies  Allergen Reactions  . Aspirin Other (See Comments)    Ulcer - bleeding  . Sulfonamide Derivatives     Current Home  Medications Current Outpatient Prescriptions  Medication Sig Dispense Refill  . acetaminophen (TYLENOL) 325 MG tablet Take 650 mg by mouth every 6 (six) hours as needed.      . digoxin (LANOXIN) 0.125 MG tablet Take 62.5 mcg by mouth daily.      . furosemide (LASIX) 20 MG tablet Take 20 mg by mouth daily. For leg swelling      . hydrOXYzine (ATARAX/VISTARIL) 25 MG tablet Take 25 mg by mouth every 6 (six) hours.      Marland Kitchen levothyroxine (SYNTHROID, LEVOTHROID) 25 MCG tablet Take 1 tablet (25 mcg total) by mouth daily before breakfast.      . Multiple Vitamin (MULTIVITAMIN) tablet Take 1 tablet by mouth daily.        Social History Social History  . Marital Status: Widowed   Social History Main Topics  . Smoking status: Never Smoker   . Smokeless tobacco: No  . Alcohol Use: No  . Drug Use: No   Review of Systems (limited due to patient's dementia; obtained from caregiver) General: No chills, fever, night sweats or weight changes Cardiovascular: No chest pain, dyspnea on exertion, edema, palpitations Dermatological: No rash, lesions or masses Respiratory: No cough, dyspnea Urologic: No hematuria, dysuria Abdominal: No nausea, vomiting, diarrhea, bright red blood per rectum or hematemesis Neurologic: No new changes in mental status All other systems reviewed and are otherwise negative except as noted above.  Physical Exam (limited due to patient's dementia and agitation) Blood pressure 124/66, pulse  75, height 5\' 7"  (1.702 m), weight 130 lb (58.968 kg).  General: Frail, chronically ill appearing 79 year old female in no acute distress. HEENT: Normocephalic, atraumatic. EOMs intact. Sclera nonicteric. Oropharynx clear. Very poor dentition noted. Neck: Supple. No JVD with patient sitting upright, examined in wheelchair. Lungs: Respirations regular and unlabored. No wheezes, rales or rhonchi. Heart: Regular. S1, S2 present. Abdomen: Soft, non-distended.  Extremities: No clubbing, cyanosis  or edema.    Diagnostics Device interrogation performed by me in clinic shows normal dual chamber PPM function with good battery status and stable lead measurements/parameters; 4 VHR episodes, longest 5 seconds in duration; 35 mode switch episodes, 5 AHR episodes, ~1% of the time; longest 14 hours; histogram appropriate for patient activity level; no programming changes made; see PaceArt report  Assessment and Plan 1. Tachy-bradycardia syndrome Normal pacemaker function  See Pace Art report  No changes today  2. Paroxysmal atrial fibrillation  Presently maintaining sinus rhythm and rate controlled  She is not a candidate for coumadin or even ASA at this time due to GI bleeding  3. Hypertension Stable  No change required today   As above. In addition, she will return to clinic for pacemaker check in 6 months.  Signed, Rick Duff, PA-C 09/07/2012, 12:13 PM

## 2012-09-07 NOTE — Patient Instructions (Signed)
Your physician wants you to follow-up in: 6 months with Dr Sanjuan Dame. You will receive a reminder letter in the mail two months in advance. If you don't receive a letter, please call our office to schedule the follow-up appointment.

## 2013-01-28 ENCOUNTER — Emergency Department (HOSPITAL_COMMUNITY): Payer: Medicare Other

## 2013-01-28 ENCOUNTER — Encounter (HOSPITAL_COMMUNITY): Payer: Self-pay

## 2013-01-28 ENCOUNTER — Inpatient Hospital Stay (HOSPITAL_COMMUNITY)
Admission: EM | Admit: 2013-01-28 | Discharge: 2013-02-05 | DRG: 481 | Disposition: A | Discharge status: Skilled Nursing Facility | Payer: Medicare Other | Attending: Internal Medicine | Admitting: Internal Medicine

## 2013-01-28 DIAGNOSIS — I5189 Other ill-defined heart diseases: Secondary | ICD-10-CM

## 2013-01-28 DIAGNOSIS — F03918 Unspecified dementia, unspecified severity, with other behavioral disturbance: Secondary | ICD-10-CM | POA: Diagnosis present

## 2013-01-28 DIAGNOSIS — L89153 Pressure ulcer of sacral region, stage 3: Secondary | ICD-10-CM

## 2013-01-28 DIAGNOSIS — I495 Sick sinus syndrome: Secondary | ICD-10-CM | POA: Diagnosis present

## 2013-01-28 DIAGNOSIS — H04002 Unspecified dacryoadenitis, left lacrimal gland: Secondary | ICD-10-CM

## 2013-01-28 DIAGNOSIS — N390 Urinary tract infection, site not specified: Secondary | ICD-10-CM | POA: Diagnosis present

## 2013-01-28 DIAGNOSIS — I10 Essential (primary) hypertension: Secondary | ICD-10-CM | POA: Diagnosis present

## 2013-01-28 DIAGNOSIS — I4891 Unspecified atrial fibrillation: Secondary | ICD-10-CM | POA: Diagnosis present

## 2013-01-28 DIAGNOSIS — R64 Cachexia: Secondary | ICD-10-CM

## 2013-01-28 DIAGNOSIS — S7292XS Unspecified fracture of left femur, sequela: Secondary | ICD-10-CM

## 2013-01-28 DIAGNOSIS — A498 Other bacterial infections of unspecified site: Secondary | ICD-10-CM | POA: Diagnosis present

## 2013-01-28 DIAGNOSIS — R7309 Other abnormal glucose: Secondary | ICD-10-CM | POA: Diagnosis not present

## 2013-01-28 DIAGNOSIS — S7291XA Unspecified fracture of right femur, initial encounter for closed fracture: Secondary | ICD-10-CM

## 2013-01-28 DIAGNOSIS — F0391 Unspecified dementia with behavioral disturbance: Secondary | ICD-10-CM

## 2013-01-28 DIAGNOSIS — S72453A Displaced supracondylar fracture without intracondylar extension of lower end of unspecified femur, initial encounter for closed fracture: Principal | ICD-10-CM | POA: Diagnosis present

## 2013-01-28 DIAGNOSIS — S7290XA Unspecified fracture of unspecified femur, initial encounter for closed fracture: Secondary | ICD-10-CM

## 2013-01-28 DIAGNOSIS — R609 Edema, unspecified: Secondary | ICD-10-CM

## 2013-01-28 DIAGNOSIS — I2510 Atherosclerotic heart disease of native coronary artery without angina pectoris: Secondary | ICD-10-CM | POA: Diagnosis present

## 2013-01-28 DIAGNOSIS — E870 Hyperosmolality and hypernatremia: Secondary | ICD-10-CM | POA: Diagnosis not present

## 2013-01-28 DIAGNOSIS — F0390 Unspecified dementia without behavioral disturbance: Secondary | ICD-10-CM | POA: Diagnosis present

## 2013-01-28 DIAGNOSIS — Z950 Presence of cardiac pacemaker: Secondary | ICD-10-CM

## 2013-01-28 DIAGNOSIS — E871 Hypo-osmolality and hyponatremia: Secondary | ICD-10-CM

## 2013-01-28 DIAGNOSIS — Z66 Do not resuscitate: Secondary | ICD-10-CM | POA: Diagnosis present

## 2013-01-28 DIAGNOSIS — E039 Hypothyroidism, unspecified: Secondary | ICD-10-CM | POA: Diagnosis present

## 2013-01-28 DIAGNOSIS — D500 Iron deficiency anemia secondary to blood loss (chronic): Secondary | ICD-10-CM | POA: Diagnosis present

## 2013-01-28 DIAGNOSIS — Y9210 Unspecified residential institution as the place of occurrence of the external cause: Secondary | ICD-10-CM | POA: Diagnosis present

## 2013-01-28 DIAGNOSIS — R460 Very low level of personal hygiene: Secondary | ICD-10-CM

## 2013-01-28 DIAGNOSIS — I5032 Chronic diastolic (congestive) heart failure: Secondary | ICD-10-CM | POA: Diagnosis present

## 2013-01-28 DIAGNOSIS — W19XXXA Unspecified fall, initial encounter: Secondary | ICD-10-CM | POA: Diagnosis present

## 2013-01-28 DIAGNOSIS — T503X5A Adverse effect of electrolytic, caloric and water-balance agents, initial encounter: Secondary | ICD-10-CM | POA: Diagnosis not present

## 2013-01-28 DIAGNOSIS — I509 Heart failure, unspecified: Secondary | ICD-10-CM | POA: Diagnosis present

## 2013-01-28 DIAGNOSIS — S7292XA Unspecified fracture of left femur, initial encounter for closed fracture: Secondary | ICD-10-CM

## 2013-01-28 DIAGNOSIS — Z79899 Other long term (current) drug therapy: Secondary | ICD-10-CM

## 2013-01-28 DIAGNOSIS — D509 Iron deficiency anemia, unspecified: Secondary | ICD-10-CM

## 2013-01-28 LAB — CBC WITH DIFFERENTIAL/PLATELET
Basophils Absolute: 0 10*3/uL (ref 0.0–0.1)
Basophils Relative: 0 % (ref 0–1)
Eosinophils Absolute: 0 10*3/uL (ref 0.0–0.7)
Eosinophils Relative: 0 % (ref 0–5)
HCT: 34.2 % — ABNORMAL LOW (ref 36.0–46.0)
Hemoglobin: 11.5 g/dL — ABNORMAL LOW (ref 12.0–15.0)
MCH: 27 pg (ref 26.0–34.0)
MCHC: 33.6 g/dL (ref 30.0–36.0)
Monocytes Absolute: 1.1 10*3/uL — ABNORMAL HIGH (ref 0.1–1.0)
Monocytes Relative: 17 % — ABNORMAL HIGH (ref 3–12)
Neutro Abs: 4 10*3/uL (ref 1.7–7.7)
RDW: 13.9 % (ref 11.5–15.5)

## 2013-01-28 LAB — COMPREHENSIVE METABOLIC PANEL
AST: 27 U/L (ref 0–37)
Albumin: 3.1 g/dL — ABNORMAL LOW (ref 3.5–5.2)
BUN: 27 mg/dL — ABNORMAL HIGH (ref 6–23)
Calcium: 9.2 mg/dL (ref 8.4–10.5)
Creatinine, Ser: 0.65 mg/dL (ref 0.50–1.10)
Total Bilirubin: 0.5 mg/dL (ref 0.3–1.2)
Total Protein: 6.9 g/dL (ref 6.0–8.3)

## 2013-01-28 LAB — URINALYSIS, ROUTINE W REFLEX MICROSCOPIC
Bilirubin Urine: NEGATIVE
Glucose, UA: NEGATIVE mg/dL
Protein, ur: NEGATIVE mg/dL
Specific Gravity, Urine: 1.024 (ref 1.005–1.030)
Urobilinogen, UA: 0.2 mg/dL (ref 0.0–1.0)

## 2013-01-28 LAB — URINE MICROSCOPIC-ADD ON

## 2013-01-28 LAB — DIGOXIN LEVEL: Digoxin Level: 0.3 ng/mL — ABNORMAL LOW (ref 0.8–2.0)

## 2013-01-28 LAB — SURGICAL PCR SCREEN: MRSA, PCR: NEGATIVE

## 2013-01-28 MED ORDER — DIGOXIN 0.25 MG/ML IJ SOLN
0.0625 mg | Freq: Every day | INTRAMUSCULAR | Status: DC
  Administered 2013-01-28 – 2013-01-29 (×2): 0.0625 mg via INTRAVENOUS
  Filled 2013-01-28 (×4): qty 0.5

## 2013-01-28 MED ORDER — HYDROCODONE-ACETAMINOPHEN 5-325 MG PO TABS
1.0000 | ORAL_TABLET | Freq: Four times a day (QID) | ORAL | Status: DC | PRN
  Administered 2013-01-30 – 2013-02-04 (×3): 1 via ORAL
  Filled 2013-01-28: qty 1
  Filled 2013-01-28: qty 2
  Filled 2013-01-28 (×2): qty 1

## 2013-01-28 MED ORDER — CHLORHEXIDINE GLUCONATE CLOTH 2 % EX PADS
6.0000 | MEDICATED_PAD | Freq: Every day | CUTANEOUS | Status: DC
  Administered 2013-01-29 – 2013-01-31 (×3): 6 via TOPICAL

## 2013-01-28 MED ORDER — LEVOTHYROXINE SODIUM 100 MCG IV SOLR
12.5000 ug | Freq: Every day | INTRAVENOUS | Status: DC
  Administered 2013-01-29: 12.5 ug via INTRAVENOUS
  Filled 2013-01-28 (×2): qty 5

## 2013-01-28 MED ORDER — SODIUM CHLORIDE 0.9 % IV BOLUS (SEPSIS)
500.0000 mL | Freq: Once | INTRAVENOUS | Status: AC
  Administered 2013-01-28: 500 mL via INTRAVENOUS

## 2013-01-28 MED ORDER — MUPIROCIN 2 % EX OINT
1.0000 "application " | TOPICAL_OINTMENT | Freq: Two times a day (BID) | CUTANEOUS | Status: AC
  Administered 2013-01-28 – 2013-02-02 (×10): 1 via NASAL
  Filled 2013-01-28 (×2): qty 22

## 2013-01-28 MED ORDER — HEPARIN SODIUM (PORCINE) 5000 UNIT/ML IJ SOLN
5000.0000 [IU] | Freq: Three times a day (TID) | INTRAMUSCULAR | Status: DC
  Administered 2013-01-28 – 2013-01-30 (×6): 5000 [IU] via SUBCUTANEOUS
  Filled 2013-01-28 (×11): qty 1

## 2013-01-28 MED ORDER — FENTANYL CITRATE 0.05 MG/ML IJ SOLN
50.0000 ug | Freq: Once | INTRAMUSCULAR | Status: AC
  Administered 2013-01-28: 50 ug via INTRAVENOUS
  Filled 2013-01-28: qty 2

## 2013-01-28 MED ORDER — MORPHINE SULFATE 2 MG/ML IJ SOLN
1.0000 mg | INTRAMUSCULAR | Status: DC | PRN
  Administered 2013-01-29 – 2013-01-31 (×8): 1 mg via INTRAVENOUS
  Filled 2013-01-28 (×8): qty 1

## 2013-01-28 MED ORDER — MORPHINE SULFATE 2 MG/ML IJ SOLN
0.5000 mg | INTRAMUSCULAR | Status: DC | PRN
  Administered 2013-01-28: 0.5 mg via INTRAVENOUS
  Filled 2013-01-28: qty 1

## 2013-01-28 NOTE — H&P (Signed)
History and Physical       Hospital Admission Note Date: 01/28/2013  Patient name: Andrea Koch Medical record number: [id number] Date of birth: 04-Oct-1931 Age: 79 y.o. Gender: female PCP: Provider Not In System    Chief Complaint:  Status post fall today at the nursing home  HPI: Patient is a 79 year old female with severe advanced dementia, hypertension, coronary artery disease, paroxysmal atrial fibrillation on digoxin, history of tachybradycardia syndrome was transferred from skilled nursing facility after a fall today and patient has advanced dementia and is unable to provide any history. She opens her eyes to her name and states "yes" to the fall today.  According to EMS report and EDP, patient is at [facility] facility and fell at 3:30 AM this morning. X-rays were done and apparently patient has a known injury to the right femur and right knee. However x-rays done today in ED shows left femur fracture as well. Orthopedics has been consulted by ED physician and hospitalist service was requested for admission.   Review of Systems:  Patient was unable to provide any review of system due to her advanced dementia   Past Medical History: Past Medical History  Diagnosis Date  . Dementia     advanced  . HTN (hypertension)   . Atrial fibrillation     paroxysmal  . Tachycardia-bradycardia syndrome     s/p PPM by Dr Deborah Chalk  . GI bleed 2/12    ulcer to ASA  . Diastolic dysfunction   . CHF (congestive heart failure)   . Pacemaker   . Coronary artery disease    Past Surgical History  Procedure Laterality Date  . Pacemaker insertion  09/02/09    by Dr Deborah Chalk due to tachycardia bradycardia syndrome and syncope (MDT)    Medications: Prior to Admission medications   Medication Sig Start Date End Date Taking? Authorizing Provider  acetaminophen (TYLENOL) 325 MG tablet Take 650 mg by mouth every 6 (six) hours as needed.     Historical Provider, MD  digoxin (LANOXIN) 0.125 MG tablet Take 62.5 mcg by mouth daily. 03/30/11   Peter M [place], MD  furosemide (LASIX) 20 MG tablet Take 20 mg by mouth daily. For leg swelling    Historical Provider, MD  hydrOXYzine (ATARAX/VISTARIL) 25 MG tablet Take 25 mg by mouth every 6 (six) hours.    Historical Provider, MD  levothyroxine (SYNTHROID, LEVOTHROID) 25 MCG tablet Take 1 tablet (25 mcg total) by mouth daily before breakfast. 12/04/11 12/03/12  Clydia Llano, MD  Multiple Vitamin (MULTIVITAMIN) tablet Take 1 tablet by mouth daily.    Historical Provider, MD    Allergies:   Allergies  Allergen Reactions  . Aspirin Other (See Comments)    Ulcer - bleeding  . Sulfonamide Derivatives     Social History: Per EDP report reports that she has never smoked. She does not have any smokeless tobacco history on file. She reports that she does not drink alcohol or use illicit drugs. Patient is unable to provide any social history, she is currently resident of skilled nursing facility   Family History:  patient unable to provide any family history   Physical Exam: Blood pressure 120/45, pulse 65, temperature 99.6 F (37.6 C), resp. rate 22, SpO2 99.00%. General: awake, but confused, opens eyes to her name and responds only yes to questions  in no acute distress. HEENT: normocephalic, atraumatic, anicteric sclera, pink conjunctiva, pupils equal and reactive to light and accomodation, oropharynx clear, echymotic area on the right  side of the eye and under the chin  Neck: supple, no masses or lymphadenopathy, no goiter, no bruits  Heart: irregularly irregular  Lungs: Clear to auscultation anteriorly , no wheezing, rales or rhonchi. Abdomen: Soft, nontender, nondistended, positive bowel sounds, no masses. Extremities: No clubbing, cyanosis or edema with positive pedal pulses. Neuro: unable to follow any commands  Psych:  does not follow any commands  Skin:  ecchymotic areas due to the  fall, on the legs and right side of the eye, under the chin   LABS on Admission:  Basic Metabolic Panel:  Recent Labs Lab 01/28/13 1744  NA 132*  K 4.8  CL 96  CO2 24  GLUCOSE 161*  BUN 27*  CREATININE 0.65  CALCIUM 9.2   Liver Function Tests:  Recent Labs Lab 01/28/13 1744  AST 27  ALT 22  ALKPHOS 124*  BILITOT 0.5  PROT 6.9  ALBUMIN 3.1*   No results found for this basename: LIPASE, AMYLASE,  in the last 168 hours No results found for this basename: AMMONIA,  in the last 168 hours CBC:  Recent Labs Lab 01/28/13 1744  WBC 6.3  NEUTROABS 4.0  HGB 11.5*  HCT 34.2*  MCV 80.3  PLT 310   Cardiac Enzymes: No results found for this basename: CKTOTAL, CKMB, CKMBINDEX, TROPONINI,  in the last 168 hours BNP: No components found with this basename: POCBNP,  CBG: No results found for this basename: GLUCAP,  in the last 168 hours   Radiological Exams on Admission: Dg Chest 1 View  01/28/2013   *RADIOLOGY REPORT*  Clinical Data: Bilateral thigh pain status post fall today.  CHEST - 1 VIEW  Comparison: 11/27/2011 and 06/17/2010.  Findings: Right subclavian pacemaker leads appear unchanged within the right atrium and ventricle.  The heart size and mediastinal contours are stable.  There is a questionable new nodular density in the left suprahilar region adjacent to the aortic arch.  This could be related to the first costochondral junction and different positioning.  The lungs are otherwise clear.  There is no pleural effusion or pneumothorax.  Osteophytes are noted throughout the thoracic spine.  IMPRESSION:  1.  No acute findings identified. 2.  Cannot exclude a developing nodule in the left suprahilar region.  This could be due to the costochondral junction and positional differences.  Attention to this area on follow-up recommended.   Original Report Authenticated By: Carey Bullocks, M.D.   Dg Pelvis 1-2 Views  01/28/2013   *RADIOLOGY REPORT*  Clinical Data: Fall and hip  injury  PELVIS - 1-2 VIEW  Comparison: 05/24/2009  Findings: Thoracic spine incompletely imaged, with suggestion of central possible endplate compression deformities, not further evaluated on this exam, unchanged.  Moderate stool volume projecting over descending and sigmoid colon.  No displaced pelvic fracture identified.  IMPRESSION: No displaced pelvic fracture.  Stable appearance to the inferior lumbar spine with suggestion of central endplate compression deformities again noted.   Original Report Authenticated By: Christiana Pellant, M.D.   Dg Femur Left  01/28/2013   *RADIOLOGY REPORT*  Clinical Data: Fall, leg pain  LEFT FEMUR - 2 VIEW  Comparison: Pelvic radiograph same date, prior exam 05/24/2009  Findings: Mild left hip degenerative change is identified. Bones are osteopenic, which may mask subtle fracture.  Moderate stool volume noted over the sigmoid and descending colon.  Lumbar spine central compression deformities re-identified.  Vascular calcifications noted. There is a comminuted oblique distal left femoral fracture with approximately one half shaft width  overlap of the fracture fragments.  Tricompartmental degenerative changes are noted in the knee.  Mild varus angulation at the fracture site. Prepatellar soft tissue swelling is identified.  IMPRESSION: A comminuted oblique distal left femoral fracture without apparent intra-articular extension.   Original Report Authenticated By: Christiana Pellant, M.D.   Dg Femur Right  01/28/2013   *RADIOLOGY REPORT*  Clinical Data: Fall today with bilateral femur pain.  RIGHT FEMUR - 2 VIEW  Comparison: None.  Findings: Mild to moderate osteopenia.  Extensive artifact , presumably due to clothing.  A markedly comminuted and angulated fracture of the distal femur, suboptimally evaluated. Intra- articular extension.  The more proximal and mid femoral shaft demonstrates no fracture.  Suboptimal evaluation of the proximal tibia and fibula.  IMPRESSION: Moderately  degraded secondary to artifact distally.  Comminuted intra-articular distal femur fracture.  Recommend dedicated knee films.  The more proximal femur demonstrates no acute finding.   Original Report Authenticated By: Jeronimo Greaves, M.D.   Dg Tibia/fibula Left  01/28/2013   *RADIOLOGY REPORT*  Clinical Data: Fall today.  Lower leg bruising.  LEFT TIBIA AND FIBULA - 2 VIEW  Comparison: None.  Findings: The knees are excluded from this examination and reported separately.  The bones are diffusely demineralized.  There is no evidence of acute fracture or dislocation in the lower leg. Vascular calcifications are noted.  There is joint space loss within the hind foot.  Scattered vascular calcifications are noted.  IMPRESSION: No evidence of left lower leg acute fracture or dislocation. Distal femoral/knee findings are dictated separately.   Original Report Authenticated By: Carey Bullocks, M.D.   Dg Tibia/fibula Right  01/28/2013   *RADIOLOGY REPORT*  Clinical Data: Fall today.  Lower extremity bruising.  RIGHT TIBIA AND FIBULA - 2 VIEW  Comparison: None.  Findings: The bones appears severely demineralized.  No acute fracture or dislocation of the mid to distal lower legs is demonstrated.  The knees are excluded from these images, and discussed on the separate examination of the right femur. Scattered vascular calcifications are noted.  The positioning is suboptimal for evaluation of the ankle.  Joint space loss is noted throughout the hind foot.  IMPRESSION: No acute findings are identified within the lower leg.  Distal femoral/knee findings are reported separately.   Original Report Authenticated By: Carey Bullocks, M.D.   Ct Head Wo Contrast  01/28/2013   *RADIOLOGY REPORT*  Clinical Data:  Status post fall.  Hip injury.  Altered mental status.  History of dementia.  CT HEAD WITHOUT CONTRAST CT CERVICAL SPINE WITHOUT CONTRAST  Technique:  Multidetector CT imaging of the head and cervical spine was performed  following the standard protocol without intravenous contrast.  Multiplanar CT image reconstructions of the cervical spine were also generated.  Comparison:  Head CT 11/27/2011.  Cervical spine CT 08/05/2011.  CT HEAD  Findings: There is progressive atrophy with diffuse prominence of the ventricles and subarachnoid spaces.  Confluent low density in the periventricular white matter also appears mildly progressive. However, there is no evidence of acute intracranial hemorrhage, mass lesion, brain edema or extra-axial fluid collection.  There is no evidence of acute infarct.  The visualized paranasal sinuses are clear.  The calvarium is intact.  IMPRESSION:  1.  Progressive atrophy and chronic small vessel ischemic changes. 2.  No acute intracranial findings identified.  CT CERVICAL SPINE  Findings: The cervical alignment appears similar.  There is a retrolisthesis at C3-C4 associated with occult spinal dysraphism at C3. This results  in chronic central and biforaminal stenosis. There is a mild anterolisthesis at C5-C6.  There is multilevel spondylosis.  No acute fracture or traumatic subluxation is identified.  No acute soft tissue findings are evident.  Carotid arterial calcifications are noted bilaterally.  IMPRESSION: No evidence of acute cervical spine fracture, traumatic subluxation or static signs of instability.  Stable alignment and multilevel spondylosis contributing to spinal stenosis at C3-C4.   Original Report Authenticated By: Carey Bullocks, M.D.   Ct Cervical Spine Wo Contrast  01/28/2013   *RADIOLOGY REPORT*  Clinical Data:  Status post fall.  Hip injury.  Altered mental status.  History of dementia.  CT HEAD WITHOUT CONTRAST CT CERVICAL SPINE WITHOUT CONTRAST  Technique:  Multidetector CT imaging of the head and cervical spine was performed following the standard protocol without intravenous contrast.  Multiplanar CT image reconstructions of the cervical spine were also generated.  Comparison:  Head  CT 11/27/2011.  Cervical spine CT 08/05/2011.  CT HEAD  Findings: There is progressive atrophy with diffuse prominence of the ventricles and subarachnoid spaces.  Confluent low density in the periventricular white matter also appears mildly progressive. However, there is no evidence of acute intracranial hemorrhage, mass lesion, brain edema or extra-axial fluid collection.  There is no evidence of acute infarct.  The visualized paranasal sinuses are clear.  The calvarium is intact.  IMPRESSION:  1.  Progressive atrophy and chronic small vessel ischemic changes. 2.  No acute intracranial findings identified.  CT CERVICAL SPINE  Findings: The cervical alignment appears similar.  There is a retrolisthesis at C3-C4 associated with occult spinal dysraphism at C3. This results in chronic central and biforaminal stenosis. There is a mild anterolisthesis at C5-C6.  There is multilevel spondylosis.  No acute fracture or traumatic subluxation is identified.  No acute soft tissue findings are evident.  Carotid arterial calcifications are noted bilaterally.  IMPRESSION: No evidence of acute cervical spine fracture, traumatic subluxation or static signs of instability.  Stable alignment and multilevel spondylosis contributing to spinal stenosis at C3-C4.   Original Report Authenticated By: Carey Bullocks, M.D.    Assessment/Plan Principal Problem:   Femur fracture, left and right - Admit to telemetry, under hip fracture protocol - EDP has consulted orthopedics, will evaluate patient for further management  Active Problems:   Paroxysmal atrial fibrillation: Currently rate controlled - Patient not on any anticoagulation    Hypertension: Currently stable    Dementia   Hypothyroidism - Will change to IV Synthroid, keep n.p.o.   DVT prophylaxis:  heparin subcutaneous   CODE STATUS:  DO NOT RESUSCITATE status per her advanced directives. I was unable to reach the patient's son.    Further plan will depend as  patient's clinical course evolves and further radiologic and laboratory data become available.   Time Spent on Admission: 1 hour  Valeda Corzine M.D. [facility] 01/28/2013, 7:23 PM Pager: [phone]  If 7PM-7AM, please contact night-coverage [email or web address] Password [email or web address]

## 2013-01-28 NOTE — Consult Note (Addendum)
Reason for Consult:bilateral distal femur fractures Referring Physician: hospitalists  Andrea Koch is an 79 y.o. female.  HPI: the patient is a 79 year old female who is best we can tell is a nonambulator.  She is known to have cognitive impairment.  She was found to have lower extremity pain and was transferred to the hospital for evaluation.  No history is given as to the injury.  She is evaluated in the emergency room and noted to have bilateral distal femur fractures.  We are consulted the by the hospitalist service for evaluation of these femur fractures.I am unable to get a hold of any family members to get additional history.  Past Medical History  Diagnosis Date  . Dementia     advanced  . HTN (hypertension)   . Atrial fibrillation     paroxysmal  . Tachycardia-bradycardia syndrome     s/p PPM by Dr Deborah Chalk  . GI bleed 2/12    ulcer to ASA  . Diastolic dysfunction   . CHF (congestive heart failure)   . Pacemaker   . Coronary artery disease     Past Surgical History  Procedure Laterality Date  . Pacemaker insertion  09/02/09    by Dr Deborah Chalk due to tachycardia bradycardia syndrome and syncope (MDT)    No family history on file.  Social History:  reports that she has never smoked. She does not have any smokeless tobacco history on file. She reports that she does not drink alcohol or use illicit drugs.  Allergies:  Allergies  Allergen Reactions  . Aspirin Other (See Comments)    Ulcer - bleeding  . Sulfonamide Derivatives     Medications: I have reviewed the patient's current medications.  Results for orders placed during the hospital encounter of 01/28/13 (from the past 48 hour(s))  CBC WITH DIFFERENTIAL     Status: Abnormal   Collection Time    01/28/13  5:44 PM      Result Value Range   WBC 6.3  4.0 - 10.5 K/uL   RBC 4.26  3.87 - 5.11 MIL/uL   Hemoglobin 11.5 (*) 12.0 - 15.0 g/dL   HCT 16.1 (*) 09.6 - 04.5 %   MCV 80.3  78.0 - 100.0 fL   MCH 27.0   26.0 - 34.0 pg   MCHC 33.6  30.0 - 36.0 g/dL   RDW 40.9  81.1 - 91.4 %   Platelets 310  150 - 400 K/uL   Neutrophils Relative % 64  43 - 77 %   Neutro Abs 4.0  1.7 - 7.7 K/uL   Lymphocytes Relative 18  12 - 46 %   Lymphs Abs 1.1  0.7 - 4.0 K/uL   Monocytes Relative 17 (*) 3 - 12 %   Monocytes Absolute 1.1 (*) 0.1 - 1.0 K/uL   Eosinophils Relative 0  0 - 5 %   Eosinophils Absolute 0.0  0.0 - 0.7 K/uL   Basophils Relative 0  0 - 1 %   Basophils Absolute 0.0  0.0 - 0.1 K/uL  COMPREHENSIVE METABOLIC PANEL     Status: Abnormal   Collection Time    01/28/13  5:44 PM      Result Value Range   Sodium 132 (*) 135 - 145 mEq/L   Potassium 4.8  3.5 - 5.1 mEq/L   Chloride 96  96 - 112 mEq/L   CO2 24  19 - 32 mEq/L   Glucose, Bld 161 (*) 70 - 99 mg/dL   BUN  27 (*) 6 - 23 mg/dL   Creatinine, Ser 1.61  0.50 - 1.10 mg/dL   Calcium 9.2  8.4 - 09.6 mg/dL   Total Protein 6.9  6.0 - 8.3 g/dL   Albumin 3.1 (*) 3.5 - 5.2 g/dL   AST 27  0 - 37 U/L   ALT 22  0 - 35 U/L   Alkaline Phosphatase 124 (*) 39 - 117 U/L   Total Bilirubin 0.5  0.3 - 1.2 mg/dL   GFR calc non Af Amer 82 (*) >90 mL/min   GFR calc Af Amer >90  >90 mL/min   Comment:            The eGFR has been calculated     using the CKD EPI equation.     This calculation has not been     validated in all clinical     situations.     eGFR's persistently     <90 mL/min signify     possible Chronic Kidney Disease.  DIGOXIN LEVEL     Status: Abnormal   Collection Time    01/28/13  5:44 PM      Result Value Range   Digoxin Level 0.3 (*) 0.8 - 2.0 ng/mL  TYPE AND SCREEN     Status: None   Collection Time    01/28/13  5:45 PM      Result Value Range   ABO/RH(D) O POS     Antibody Screen NEG     Sample Expiration 01/31/2013      Dg Chest 1 View  01/28/2013   *RADIOLOGY REPORT*  Clinical Data: Bilateral thigh pain status post fall today.  CHEST - 1 VIEW  Comparison: 11/27/2011 and 06/17/2010.  Findings: Right subclavian pacemaker leads  appear unchanged within the right atrium and ventricle.  The heart size and mediastinal contours are stable.  There is a questionable new nodular density in the left suprahilar region adjacent to the aortic arch.  This could be related to the first costochondral junction and different positioning.  The lungs are otherwise clear.  There is no pleural effusion or pneumothorax.  Osteophytes are noted throughout the thoracic spine.  IMPRESSION:  1.  No acute findings identified. 2.  Cannot exclude a developing nodule in the left suprahilar region.  This could be due to the costochondral junction and positional differences.  Attention to this area on follow-up recommended.   Original Report Authenticated By: Carey Bullocks, M.D.   Dg Pelvis 1-2 Views  01/28/2013   *RADIOLOGY REPORT*  Clinical Data: Fall and hip injury  PELVIS - 1-2 VIEW  Comparison: 05/24/2009  Findings: Thoracic spine incompletely imaged, with suggestion of central possible endplate compression deformities, not further evaluated on this exam, unchanged.  Moderate stool volume projecting over descending and sigmoid colon.  No displaced pelvic fracture identified.  IMPRESSION: No displaced pelvic fracture.  Stable appearance to the inferior lumbar spine with suggestion of central endplate compression deformities again noted.   Original Report Authenticated By: Christiana Pellant, M.D.   Dg Femur Left  01/28/2013   *RADIOLOGY REPORT*  Clinical Data: Fall, leg pain  LEFT FEMUR - 2 VIEW  Comparison: Pelvic radiograph same date, prior exam 05/24/2009  Findings: Mild left hip degenerative change is identified. Bones are osteopenic, which may mask subtle fracture.  Moderate stool volume noted over the sigmoid and descending colon.  Lumbar spine central compression deformities re-identified.  Vascular calcifications noted. There is a comminuted oblique distal left femoral fracture with approximately  one half shaft width overlap of the fracture fragments.   Tricompartmental degenerative changes are noted in the knee.  Mild varus angulation at the fracture site. Prepatellar soft tissue swelling is identified.  IMPRESSION: A comminuted oblique distal left femoral fracture without apparent intra-articular extension.   Original Report Authenticated By: Christiana Pellant, M.D.   Dg Femur Right  01/28/2013   *RADIOLOGY REPORT*  Clinical Data: Fall today with bilateral femur pain.  RIGHT FEMUR - 2 VIEW  Comparison: None.  Findings: Mild to moderate osteopenia.  Extensive artifact , presumably due to clothing.  A markedly comminuted and angulated fracture of the distal femur, suboptimally evaluated. Intra- articular extension.  The more proximal and mid femoral shaft demonstrates no fracture.  Suboptimal evaluation of the proximal tibia and fibula.  IMPRESSION: Moderately degraded secondary to artifact distally.  Comminuted intra-articular distal femur fracture.  Recommend dedicated knee films.  The more proximal femur demonstrates no acute finding.   Original Report Authenticated By: Jeronimo Greaves, M.D.   Dg Tibia/fibula Left  01/28/2013   *RADIOLOGY REPORT*  Clinical Data: Fall today.  Lower leg bruising.  LEFT TIBIA AND FIBULA - 2 VIEW  Comparison: None.  Findings: The knees are excluded from this examination and reported separately.  The bones are diffusely demineralized.  There is no evidence of acute fracture or dislocation in the lower leg. Vascular calcifications are noted.  There is joint space loss within the hind foot.  Scattered vascular calcifications are noted.  IMPRESSION: No evidence of left lower leg acute fracture or dislocation. Distal femoral/knee findings are dictated separately.   Original Report Authenticated By: Carey Bullocks, M.D.   Dg Tibia/fibula Right  01/28/2013   *RADIOLOGY REPORT*  Clinical Data: Fall today.  Lower extremity bruising.  RIGHT TIBIA AND FIBULA - 2 VIEW  Comparison: None.  Findings: The bones appears severely demineralized.  No  acute fracture or dislocation of the mid to distal lower legs is demonstrated.  The knees are excluded from these images, and discussed on the separate examination of the right femur. Scattered vascular calcifications are noted.  The positioning is suboptimal for evaluation of the ankle.  Joint space loss is noted throughout the hind foot.  IMPRESSION: No acute findings are identified within the lower leg.  Distal femoral/knee findings are reported separately.   Original Report Authenticated By: Carey Bullocks, M.D.   Ct Head Wo Contrast  01/28/2013   *RADIOLOGY REPORT*  Clinical Data:  Status post fall.  Hip injury.  Altered mental status.  History of dementia.  CT HEAD WITHOUT CONTRAST CT CERVICAL SPINE WITHOUT CONTRAST  Technique:  Multidetector CT imaging of the head and cervical spine was performed following the standard protocol without intravenous contrast.  Multiplanar CT image reconstructions of the cervical spine were also generated.  Comparison:  Head CT 11/27/2011.  Cervical spine CT 08/05/2011.  CT HEAD  Findings: There is progressive atrophy with diffuse prominence of the ventricles and subarachnoid spaces.  Confluent low density in the periventricular white matter also appears mildly progressive. However, there is no evidence of acute intracranial hemorrhage, mass lesion, brain edema or extra-axial fluid collection.  There is no evidence of acute infarct.  The visualized paranasal sinuses are clear.  The calvarium is intact.  IMPRESSION:  1.  Progressive atrophy and chronic small vessel ischemic changes. 2.  No acute intracranial findings identified.  CT CERVICAL SPINE  Findings: The cervical alignment appears similar.  There is a retrolisthesis at C3-C4 associated with occult spinal dysraphism  at C3. This results in chronic central and biforaminal stenosis. There is a mild anterolisthesis at C5-C6.  There is multilevel spondylosis.  No acute fracture or traumatic subluxation is identified.  No  acute soft tissue findings are evident.  Carotid arterial calcifications are noted bilaterally.  IMPRESSION: No evidence of acute cervical spine fracture, traumatic subluxation or static signs of instability.  Stable alignment and multilevel spondylosis contributing to spinal stenosis at C3-C4.   Original Report Authenticated By: Carey Bullocks, M.D.   Ct Cervical Spine Wo Contrast  01/28/2013   *RADIOLOGY REPORT*  Clinical Data:  Status post fall.  Hip injury.  Altered mental status.  History of dementia.  CT HEAD WITHOUT CONTRAST CT CERVICAL SPINE WITHOUT CONTRAST  Technique:  Multidetector CT imaging of the head and cervical spine was performed following the standard protocol without intravenous contrast.  Multiplanar CT image reconstructions of the cervical spine were also generated.  Comparison:  Head CT 11/27/2011.  Cervical spine CT 08/05/2011.  CT HEAD  Findings: There is progressive atrophy with diffuse prominence of the ventricles and subarachnoid spaces.  Confluent low density in the periventricular white matter also appears mildly progressive. However, there is no evidence of acute intracranial hemorrhage, mass lesion, brain edema or extra-axial fluid collection.  There is no evidence of acute infarct.  The visualized paranasal sinuses are clear.  The calvarium is intact.  IMPRESSION:  1.  Progressive atrophy and chronic small vessel ischemic changes. 2.  No acute intracranial findings identified.  CT CERVICAL SPINE  Findings: The cervical alignment appears similar.  There is a retrolisthesis at C3-C4 associated with occult spinal dysraphism at C3. This results in chronic central and biforaminal stenosis. There is a mild anterolisthesis at C5-C6.  There is multilevel spondylosis.  No acute fracture or traumatic subluxation is identified.  No acute soft tissue findings are evident.  Carotid arterial calcifications are noted bilaterally.  IMPRESSION: No evidence of acute cervical spine fracture,  traumatic subluxation or static signs of instability.  Stable alignment and multilevel spondylosis contributing to spinal stenosis at C3-C4.   Original Report Authenticated By: Carey Bullocks, M.D.    ROS: I have reviewed the patient's review of systems thoroughly and there are no positive responses as relates to the HPI. Exam:  Blood pressure 113/75, pulse 77, temperature 98.3 F (36.8 C), temperature source Oral, resp. rate 21, height 5\' 4"  (1.626 m), weight 64.3 kg (141 lb 12.1 oz), SpO2 94.00%. Well-developed poorly-nourished patient in no acute distress. Not alert or oriented HEENT:within normal limits Cardiac: Regular rate and rhythm Pulmonary: Lungs clear to auscultation Abdomen: Soft and nontender.  Normal active bowel sounds  Musculoskeletal: there is mild soft tissue swelling around the knees bilaterally.  There is slight ecchymosis over the left knee.  There is 2+ distal pulses.  Assessment/Plan: 79 year old female non-ambulator by report.  Presents with bilateral distal femur fractures.  The patient has significant cognitive impairment and is unable to give any significant history.  Surgical intervention would be difficult and risky because of the nature and magnitude of the problems.  I feel that the best treatment option at this point would be closed manipulation and placement of knee immobilizers bilaterally.  If the patient is unable to maintain a level of comfort in bed or with mobilization to chair and then we could consider intramedullary rod fixation on the right side but the left side would be much more difficult to address.//Ultimately this point because I could not get a hold of any  family and given the overall situation I felt that manipulative closed reduction and placement of the immobilizers would be the most appropriate course of action.  I have pulled out the posterior metallic bars to minimize the risk that she could have problems from the knee immobilizers cutting into  her legs.  It is doubtful that this is going to hold any significant reduction but I think should provide some measure of comfort and allow her to begin to mobilize.I provided my cell phone number to the emergency department to give to any family member that they can get a hold of some like to discuss her current treatment plan with them.  I will follow the patient in the hospital.  The patient underwent right knee manipulation with aggressive pull into extension and placement of a knee immobilizer after taking out the posterior metal rods.  Following this attention was turned to the left side where we attempted a manipulation by extending and rocking side to side.  We then placed a knee immobilizer after removing the posterior metal rods.  She had a fair amount of pain during the manipulation but ultimately this settle down after placement of the knee immobilizers.  The patient was observed for 10 min. Following manipulation seemed to be doing fine.  I will follow her in the hospital to see how she is doing.  Rollan Roger L 01/28/2013, 10:11 PM

## 2013-01-28 NOTE — Progress Notes (Signed)
Orthopedic Tech Progress Note Patient Details:  Andrea Koch May 28, 1932 [id number] Order by Dr. Luiz Blare Ortho Devices Type of Ortho Device: Knee Immobilizer Ortho Device/Splint Location: (B) LE Ortho Device/Splint Interventions: Ordered;Application   [name] 01/28/2013, 8:01 PM

## 2013-01-28 NOTE — ED Notes (Signed)
Per [facility] pt from [facility] for fall at 0330 this morning. Staff at facility dropped her. Pt has known injury to right femur and right knee after xrays. Pt has hx of afib with rate of 60-100 on the monitor.

## 2013-01-28 NOTE — ED Provider Notes (Signed)
79 year old female was sent from nursing home after a fall. Patient is completely unable to give any history because of dementia. On exam, she is noted to have an ecchymosis on her chin. She has flexion contracture of her left arm. There is tenderness to palpation of her right hip with marked pain in any movement. There is also ecchymosis and swelling around the left knee with pain on range of motion. No other extremity injury is seen. She will need x-rays of her injured extremities and will need CT of her head given the evidence of head trauma with ecchymosis of her chin.  X-rays have shown bilateral femur fractures in the distal femurs. She will be admitted with consultation to orthopedics.   I saw and evaluated the patient, reviewed the resident's note and I agree with the findings and plan.   Dione Booze, MD 01/29/13 [phone]

## 2013-01-28 NOTE — ED Provider Notes (Signed)
History     CSN: [id number]  Arrival date & time 01/28/13  1536   First MD Initiated Contact with Patient 01/28/13 1543      Chief Complaint  Patient presents with  . Fall  . Hip Injury    (Consider location/radiation/quality/duration/timing/severity/associated sxs/prior treatment) HPI Comments: 79 y.o. female w/ severe dementia, htn, CAD, presents to the [facility] from nursing facility after being found to have a femur fracture. Pt facility, they did portable xrays, that showed that right femur fracture and right knee injury. Unfortunately, pt's images are unable to uploaded into our system.   Patient is a 79 y.o. female presenting with fall. The history is provided by the EMS personnel (nursing facililty). The history is limited by the condition of the patient (pt with severe dementia).  Fall This is a new problem. The current episode started today. The problem occurs constantly. The problem has been unchanged. Nothing aggravates the symptoms.    Past Medical History  Diagnosis Date  . Dementia     advanced  . HTN (hypertension)   . Atrial fibrillation     paroxysmal  . Tachycardia-bradycardia syndrome     s/p PPM by Dr Deborah Chalk  . GI bleed 2/12    ulcer to ASA  . Diastolic dysfunction   . CHF (congestive heart failure)   . Pacemaker   . Coronary artery disease     Past Surgical History  Procedure Laterality Date  . Pacemaker insertion  09/02/09    by Dr Deborah Chalk due to tachycardia bradycardia syndrome and syncope (MDT)    No family history on file.  History  Substance Use Topics  . Smoking status: Never Smoker   . Smokeless tobacco: Not on file  . Alcohol Use: No    OB History   Grav Para Term Preterm Abortions TAB SAB Ect Mult Living                  Review of Systems  Unable to perform ROS: Dementia    Allergies  Aspirin and Sulfonamide derivatives  Home Medications   Current Outpatient Rx  Name  Route  Sig  Dispense  Refill  . acetaminophen (TYLENOL)  325 MG tablet   Oral   Take 650 mg by mouth every 6 (six) hours as needed.         . digoxin (LANOXIN) 0.125 MG tablet   Oral   Take 62.5 mcg by mouth daily.         . furosemide (LASIX) 20 MG tablet   Oral   Take 20 mg by mouth daily. For leg swelling         . hydrOXYzine (ATARAX/VISTARIL) 25 MG tablet   Oral   Take 25 mg by mouth every 6 (six) hours.         Marland Kitchen EXPIRED: levothyroxine (SYNTHROID, LEVOTHROID) 25 MCG tablet   Oral   Take 1 tablet (25 mcg total) by mouth daily before breakfast.         . Multiple Vitamin (MULTIVITAMIN) tablet   Oral   Take 1 tablet by mouth daily.           BP 96/58  Pulse 72  Temp(Src) 99.6 F (37.6 C)  Resp 14  SpO2 99%  Physical Exam  Constitutional:  cachectic  HENT:  Head: Normocephalic.  Right Ear: External ear normal.  Left Ear: External ear normal.  Eyes: Pupils are equal, round, and reactive to light.  Neck: Normal range of motion.  Cardiovascular: Normal rate.   Pulmonary/Chest: No respiratory distress. She has no wheezes.  Abdominal: She exhibits no distension. There is no tenderness.  Musculoskeletal: She exhibits no edema.  She screams out in pain anytime her lower extremities are manipulated.   Neurological:  She opens eyes to command, but only responds "yes" to every questions. She does not follow commands consistently.   Skin: Skin is warm.    ED Course  Procedures (including critical care time)  Labs Reviewed  CBC WITH DIFFERENTIAL - Abnormal; Notable for the following:    Hemoglobin 11.5 (*)    HCT 34.2 (*)    Monocytes Relative 17 (*)    Monocytes Absolute 1.1 (*)    All other components within normal limits  COMPREHENSIVE METABOLIC PANEL - Abnormal; Notable for the following:    Sodium 132 (*)    Glucose, Bld 161 (*)    BUN 27 (*)    Albumin 3.1 (*)    Alkaline Phosphatase 124 (*)    GFR calc non Af Amer 82 (*)    All other components within normal limits  DIGOXIN LEVEL - Abnormal;  Notable for the following:    Digoxin Level 0.3 (*)    All other components within normal limits  TYPE AND SCREEN   Dg Chest 1 View  01/28/2013   *RADIOLOGY REPORT*  Clinical Data: Bilateral thigh pain status post fall today.  CHEST - 1 VIEW  Comparison: 11/27/2011 and 06/17/2010.  Findings: Right subclavian pacemaker leads appear unchanged within the right atrium and ventricle.  The heart size and mediastinal contours are stable.  There is a questionable new nodular density in the left suprahilar region adjacent to the aortic arch.  This could be related to the first costochondral junction and different positioning.  The lungs are otherwise clear.  There is no pleural effusion or pneumothorax.  Osteophytes are noted throughout the thoracic spine.  IMPRESSION:  1.  No acute findings identified. 2.  Cannot exclude a developing nodule in the left suprahilar region.  This could be due to the costochondral junction and positional differences.  Attention to this area on follow-up recommended.   Original Report Authenticated By: Carey Bullocks, M.D.   Dg Pelvis 1-2 Views  01/28/2013   *RADIOLOGY REPORT*  Clinical Data: Fall and hip injury  PELVIS - 1-2 VIEW  Comparison: 05/24/2009  Findings: Thoracic spine incompletely imaged, with suggestion of central possible endplate compression deformities, not further evaluated on this exam, unchanged.  Moderate stool volume projecting over descending and sigmoid colon.  No displaced pelvic fracture identified.  IMPRESSION: No displaced pelvic fracture.  Stable appearance to the inferior lumbar spine with suggestion of central endplate compression deformities again noted.   Original Report Authenticated By: Christiana Pellant, M.D.   Dg Femur Left  01/28/2013   *RADIOLOGY REPORT*  Clinical Data: Fall, leg pain  LEFT FEMUR - 2 VIEW  Comparison: Pelvic radiograph same date, prior exam 05/24/2009  Findings: Mild left hip degenerative change is identified. Bones are osteopenic,  which may mask subtle fracture.  Moderate stool volume noted over the sigmoid and descending colon.  Lumbar spine central compression deformities re-identified.  Vascular calcifications noted. There is a comminuted oblique distal left femoral fracture with approximately one half shaft width overlap of the fracture fragments.  Tricompartmental degenerative changes are noted in the knee.  Mild varus angulation at the fracture site. Prepatellar soft tissue swelling is identified.  IMPRESSION: A comminuted oblique distal left femoral fracture without apparent intra-articular  extension.   Original Report Authenticated By: Christiana Pellant, M.D.   Dg Femur Right  01/28/2013   *RADIOLOGY REPORT*  Clinical Data: Fall today with bilateral femur pain.  RIGHT FEMUR - 2 VIEW  Comparison: None.  Findings: Mild to moderate osteopenia.  Extensive artifact , presumably due to clothing.  A markedly comminuted and angulated fracture of the distal femur, suboptimally evaluated. Intra- articular extension.  The more proximal and mid femoral shaft demonstrates no fracture.  Suboptimal evaluation of the proximal tibia and fibula.  IMPRESSION: Moderately degraded secondary to artifact distally.  Comminuted intra-articular distal femur fracture.  Recommend dedicated knee films.  The more proximal femur demonstrates no acute finding.   Original Report Authenticated By: Jeronimo Greaves, M.D.   Dg Tibia/fibula Left  01/28/2013   *RADIOLOGY REPORT*  Clinical Data: Fall today.  Lower leg bruising.  LEFT TIBIA AND FIBULA - 2 VIEW  Comparison: None.  Findings: The knees are excluded from this examination and reported separately.  The bones are diffusely demineralized.  There is no evidence of acute fracture or dislocation in the lower leg. Vascular calcifications are noted.  There is joint space loss within the hind foot.  Scattered vascular calcifications are noted.  IMPRESSION: No evidence of left lower leg acute fracture or dislocation. Distal  femoral/knee findings are dictated separately.   Original Report Authenticated By: Carey Bullocks, M.D.   Dg Tibia/fibula Right  01/28/2013   *RADIOLOGY REPORT*  Clinical Data: Fall today.  Lower extremity bruising.  RIGHT TIBIA AND FIBULA - 2 VIEW  Comparison: None.  Findings: The bones appears severely demineralized.  No acute fracture or dislocation of the mid to distal lower legs is demonstrated.  The knees are excluded from these images, and discussed on the separate examination of the right femur. Scattered vascular calcifications are noted.  The positioning is suboptimal for evaluation of the ankle.  Joint space loss is noted throughout the hind foot.  IMPRESSION: No acute findings are identified within the lower leg.  Distal femoral/knee findings are reported separately.   Original Report Authenticated By: Carey Bullocks, M.D.   Ct Head Wo Contrast  01/28/2013   *RADIOLOGY REPORT*  Clinical Data:  Status post fall.  Hip injury.  Altered mental status.  History of dementia.  CT HEAD WITHOUT CONTRAST CT CERVICAL SPINE WITHOUT CONTRAST  Technique:  Multidetector CT imaging of the head and cervical spine was performed following the standard protocol without intravenous contrast.  Multiplanar CT image reconstructions of the cervical spine were also generated.  Comparison:  Head CT 11/27/2011.  Cervical spine CT 08/05/2011.  CT HEAD  Findings: There is progressive atrophy with diffuse prominence of the ventricles and subarachnoid spaces.  Confluent low density in the periventricular white matter also appears mildly progressive. However, there is no evidence of acute intracranial hemorrhage, mass lesion, brain edema or extra-axial fluid collection.  There is no evidence of acute infarct.  The visualized paranasal sinuses are clear.  The calvarium is intact.  IMPRESSION:  1.  Progressive atrophy and chronic small vessel ischemic changes. 2.  No acute intracranial findings identified.  CT CERVICAL SPINE   Findings: The cervical alignment appears similar.  There is a retrolisthesis at C3-C4 associated with occult spinal dysraphism at C3. This results in chronic central and biforaminal stenosis. There is a mild anterolisthesis at C5-C6.  There is multilevel spondylosis.  No acute fracture or traumatic subluxation is identified.  No acute soft tissue findings are evident.  Carotid arterial calcifications are  noted bilaterally.  IMPRESSION: No evidence of acute cervical spine fracture, traumatic subluxation or static signs of instability.  Stable alignment and multilevel spondylosis contributing to spinal stenosis at C3-C4.   Original Report Authenticated By: Carey Bullocks, M.D.   Ct Cervical Spine Wo Contrast  01/28/2013   *RADIOLOGY REPORT*  Clinical Data:  Status post fall.  Hip injury.  Altered mental status.  History of dementia.  CT HEAD WITHOUT CONTRAST CT CERVICAL SPINE WITHOUT CONTRAST  Technique:  Multidetector CT imaging of the head and cervical spine was performed following the standard protocol without intravenous contrast.  Multiplanar CT image reconstructions of the cervical spine were also generated.  Comparison:  Head CT 11/27/2011.  Cervical spine CT 08/05/2011.  CT HEAD  Findings: There is progressive atrophy with diffuse prominence of the ventricles and subarachnoid spaces.  Confluent low density in the periventricular white matter also appears mildly progressive. However, there is no evidence of acute intracranial hemorrhage, mass lesion, brain edema or extra-axial fluid collection.  There is no evidence of acute infarct.  The visualized paranasal sinuses are clear.  The calvarium is intact.  IMPRESSION:  1.  Progressive atrophy and chronic small vessel ischemic changes. 2.  No acute intracranial findings identified.  CT CERVICAL SPINE  Findings: The cervical alignment appears similar.  There is a retrolisthesis at C3-C4 associated with occult spinal dysraphism at C3. This results in chronic  central and biforaminal stenosis. There is a mild anterolisthesis at C5-C6.  There is multilevel spondylosis.  No acute fracture or traumatic subluxation is identified.  No acute soft tissue findings are evident.  Carotid arterial calcifications are noted bilaterally.  IMPRESSION: No evidence of acute cervical spine fracture, traumatic subluxation or static signs of instability.  Stable alignment and multilevel spondylosis contributing to spinal stenosis at C3-C4.   Original Report Authenticated By: Carey Bullocks, M.D.     MDM  Pt w/ severe dementia that is baseline per staff, she has suspected right femur fracture, but on our exam, she has pain anytime her lower extremities are manipulated. In the setting of her dementia and the inability for her to localize her pain, we will get imaging of her bilateral lower extremities.   Comminuted R intra-articular distal femur fracture, comminuted L distal left femoral fracture. Have talked to Dr. Luiz Blare orthopedic team -- they state they will evaluate patient. Unable to reach pt's son -- his number is [phone], and have left voice mail.   Pt is admitted to the hospitalist team with orthopedic surgery closely following.   1. Femur fracture, left, closed, initial encounter   2. Femur fracture, right, closed, initial encounter            Bernadene Person, MD 01/28/13 1911

## 2013-01-29 DIAGNOSIS — D509 Iron deficiency anemia, unspecified: Secondary | ICD-10-CM

## 2013-01-29 DIAGNOSIS — S8290XS Unspecified fracture of unspecified lower leg, sequela: Secondary | ICD-10-CM

## 2013-01-29 DIAGNOSIS — S7290XA Unspecified fracture of unspecified femur, initial encounter for closed fracture: Secondary | ICD-10-CM | POA: Insufficient documentation

## 2013-01-29 LAB — BASIC METABOLIC PANEL
BUN: 29 mg/dL — ABNORMAL HIGH (ref 6–23)
CO2: 24 mEq/L (ref 19–32)
Chloride: 100 mEq/L (ref 96–112)
Glucose, Bld: 117 mg/dL — ABNORMAL HIGH (ref 70–99)
Potassium: 4.5 mEq/L (ref 3.5–5.1)
Sodium: 135 mEq/L (ref 135–145)

## 2013-01-29 MED ORDER — DEXTROSE 5 % IV SOLN
1.0000 g | INTRAVENOUS | Status: DC
  Administered 2013-01-29 – 2013-01-30 (×2): 1 g via INTRAVENOUS
  Filled 2013-01-29 (×3): qty 10

## 2013-01-29 MED ORDER — SODIUM CHLORIDE 0.9 % IV SOLN
INTRAVENOUS | Status: DC
  Administered 2013-01-29 – 2013-01-30 (×2): via INTRAVENOUS

## 2013-01-29 NOTE — Progress Notes (Signed)
Pt has had 150 output since 7AM, pt NPO and not receiving fluids, pt seems to be dehydrated.  Dr. Arbutus Leas text/paged.

## 2013-01-29 NOTE — Progress Notes (Addendum)
INITIAL NUTRITION ASSESSMENT  DOCUMENTATION CODES Per approved criteria  -Not Applicable   INTERVENTION: 1. Juven supplement po twice daily, each supplement provides 80 calories and 14 gm amino acids. Once diet has advanced.    NUTRITION DIAGNOSIS: Increased nutrient needs related to wound healing as evidenced by estimated nutrition needs.   Goal: PO intake to support wound healing  Monitor:  PO intake, weight hx, wound healing  Reason for Assessment: Low Braden Score  79 y.o. female  Admitting Dx: Femur fracture, left  ASSESSMENT: Pt admitted with bilateral distal femur fractures. Pt non-ambulatory at baseline and with advanced dementia unable to provide any additional history. Pt not a surgical candidate and was placed in leg braces for management of fractures.   Pt resting soundly at time of RD visit. Would benefit from nutrition supplements to promote wound healing.  Height: Ht Readings from Last 1 Encounters:  01/28/13 5\' 4"  (1.626 m)    Weight: Wt Readings from Last 1 Encounters:  01/29/13 141 lb 8.6 oz (64.2 kg)    Ideal Body Weight: 120 lbs   % Ideal Body Weight: 118%  Wt Readings from Last 10 Encounters:  01/29/13 141 lb 8.6 oz (64.2 kg)  09/07/12 130 lb (58.968 kg)  03/06/12 115 lb (52.164 kg)  12/04/11 128 lb 15.5 oz (58.5 kg)  02/22/11 120 lb (54.432 kg)    Usual Body Weight: variable weight hx, most recently 130 lbs  % Usual Body Weight: 108% 1 BMI:  Body mass index is 24.28 kg/(m^2). WNL   Estimated Nutritional Needs: Kcal: 1500-1700 Protein: 70-80 gm  Fluid: 1.5-1.7 L   Skin: stage II pressure ulcer on sacrum  Diet Order: NPO  EDUCATION NEEDS: -No education needs identified at this time   Intake/Output Summary (Last 24 hours) at 01/29/13 1156 Last data filed at 01/29/13 0829  Gross per 24 hour  Intake    325 ml  Output      0 ml  Net    325 ml    Last BM: 6/2   Labs:   Recent Labs Lab 01/28/13 1744 01/29/13 0730  NA  132* 135  K 4.8 4.5  CL 96 100  CO2 24 24  BUN 27* 29*  CREATININE 0.65 0.52  CALCIUM 9.2 8.4  GLUCOSE 161* 117*    CBG (last 3)  No results found for this basename: GLUCAP,  in the last 72 hours  Scheduled Meds: . Chlorhexidine Gluconate Cloth  6 each Topical Daily  . digoxin  0.0625 mg Intravenous Daily  . heparin  5,000 Units Subcutaneous Q8H  . levothyroxine  12.5 mcg Intravenous Daily  . mupirocin ointment  1 application Nasal BID    Continuous Infusions:   Past Medical History  Diagnosis Date  . Dementia     advanced  . HTN (hypertension)   . Atrial fibrillation     paroxysmal  . Tachycardia-bradycardia syndrome     s/p PPM by Dr Deborah Chalk  . GI bleed 2/12    ulcer to ASA  . Diastolic dysfunction   . CHF (congestive heart failure)   . Pacemaker   . Coronary artery disease     Past Surgical History  Procedure Laterality Date  . Pacemaker insertion  09/02/09    by Dr Deborah Chalk due to tachycardia bradycardia syndrome and syncope (MDT)    Clarene Duke RD, LDN Pager [phone] After Hours pager [phone]

## 2013-01-29 NOTE — Progress Notes (Signed)
Subjective: S/p closed reduction bilat distal femurs   Objective: Vital signs in last 24 hours: Temp:  [98 F (36.7 C)-99.6 F (37.6 C)] 98 F (36.7 C) (06/02 0531) Pulse Rate:  [63-77] 74 (06/02 0531) Resp:  [14-22] 20 (06/02 0531) BP: (96-122)/(45-75) 122/73 mmHg (06/02 0531) SpO2:  [94 %-99 %] 96 % (06/02 0531) Weight:  [64.2 kg (141 lb 8.6 oz)-64.3 kg (141 lb 12.1 oz)] 64.2 kg (141 lb 8.6 oz) (06/02 0531)  Intake/Output from previous day: 06/01 0701 - 06/02 0700 In: 325  Out: -  Intake/Output this shift:     Recent Labs  01/28/13 1744  HGB 11.5*    Recent Labs  01/28/13 1744  WBC 6.3  RBC 4.26  HCT 34.2*  PLT 310    Recent Labs  01/28/13 1744  NA 132*  K 4.8  CL 96  CO2 24  BUN 27*  CREATININE 0.65  GLUCOSE 161*  CALCIUM 9.2   No results found for this basename: LABPT, INR,  in the last 72 hours  ABD soft Sensation intact distally Compartment soft  Assessment/Plan: S/p closed red femurs with plan for non-op rx snf when available and bed to chair NON-WT BEARING ALL TIMES   Nelva Hauk L 01/29/2013, 8:17 AM

## 2013-01-29 NOTE — Progress Notes (Signed)
[facility] PROGRESS NOTE  Andrea Koch ZOX:[id number] DOB: [birth year] DOA: 01/28/2013 PCP: Provider Not In System  Assessment/Plan: Femur fracture, left and right -Appreciate orthopedic followup -Non-operative treatment per Dr. Luiz Blare -PT eval -Pain control -DVT prophylaxis Pyuria -Start ceftriaxone pending urine culture Hypothyroidism -Continue Synthroid IV Paroxysmal atrial fibrillation -Rate controlled -Continue digoxin -Not on anticoagulation Dementia -Stable Microcytic anemia -Check iron studies  Family Communication:   Pt at beside Disposition Plan:   SNF possibly 01/30/13      Procedures/Studies: Dg Chest 1 View  01/28/2013   *RADIOLOGY REPORT*  Clinical Data: Bilateral thigh pain status post fall today.  CHEST - 1 VIEW  Comparison: 11/27/2011 and 06/17/2010.  Findings: Right subclavian pacemaker leads appear unchanged within the right atrium and ventricle.  The heart size and mediastinal contours are stable.  There is a questionable new nodular density in the left suprahilar region adjacent to the aortic arch.  This could be related to the first costochondral junction and different positioning.  The lungs are otherwise clear.  There is no pleural effusion or pneumothorax.  Osteophytes are noted throughout the thoracic spine.  IMPRESSION:  1.  No acute findings identified. 2.  Cannot exclude a developing nodule in the left suprahilar region.  This could be due to the costochondral junction and positional differences.  Attention to this area on follow-up recommended.   Original Report Authenticated By: Carey Bullocks, M.D.   Dg Pelvis 1-2 Views  01/28/2013   *RADIOLOGY REPORT*  Clinical Data: Fall and hip injury  PELVIS - 1-2 VIEW  Comparison: 05/24/2009  Findings: Thoracic spine incompletely imaged, with suggestion of central possible endplate compression deformities, not further evaluated on this exam, unchanged.  Moderate stool volume projecting over descending and  sigmoid colon.  No displaced pelvic fracture identified.  IMPRESSION: No displaced pelvic fracture.  Stable appearance to the inferior lumbar spine with suggestion of central endplate compression deformities again noted.   Original Report Authenticated By: Christiana Pellant, M.D.   Dg Femur Left  01/28/2013   *RADIOLOGY REPORT*  Clinical Data: Fall, leg pain  LEFT FEMUR - 2 VIEW  Comparison: Pelvic radiograph same date, prior exam 05/24/2009  Findings: Mild left hip degenerative change is identified. Bones are osteopenic, which may mask subtle fracture.  Moderate stool volume noted over the sigmoid and descending colon.  Lumbar spine central compression deformities re-identified.  Vascular calcifications noted. There is a comminuted oblique distal left femoral fracture with approximately one half shaft width overlap of the fracture fragments.  Tricompartmental degenerative changes are noted in the knee.  Mild varus angulation at the fracture site. Prepatellar soft tissue swelling is identified.  IMPRESSION: A comminuted oblique distal left femoral fracture without apparent intra-articular extension.   Original Report Authenticated By: Christiana Pellant, M.D.   Dg Femur Right  01/28/2013   *RADIOLOGY REPORT*  Clinical Data: Fall today with bilateral femur pain.  RIGHT FEMUR - 2 VIEW  Comparison: None.  Findings: Mild to moderate osteopenia.  Extensive artifact , presumably due to clothing.  A markedly comminuted and angulated fracture of the distal femur, suboptimally evaluated. Intra- articular extension.  The more proximal and mid femoral shaft demonstrates no fracture.  Suboptimal evaluation of the proximal tibia and fibula.  IMPRESSION: Moderately degraded secondary to artifact distally.  Comminuted intra-articular distal femur fracture.  Recommend dedicated knee films.  The more proximal femur demonstrates no acute finding.   Original Report Authenticated By: Jeronimo Greaves, M.D.   Dg Tibia/fibula Left  01/28/2013    *  RADIOLOGY REPORT*  Clinical Data: Fall today.  Lower leg bruising.  LEFT TIBIA AND FIBULA - 2 VIEW  Comparison: None.  Findings: The knees are excluded from this examination and reported separately.  The bones are diffusely demineralized.  There is no evidence of acute fracture or dislocation in the lower leg. Vascular calcifications are noted.  There is joint space loss within the hind foot.  Scattered vascular calcifications are noted.  IMPRESSION: No evidence of left lower leg acute fracture or dislocation. Distal femoral/knee findings are dictated separately.   Original Report Authenticated By: Carey Bullocks, M.D.   Dg Tibia/fibula Right  01/28/2013   *RADIOLOGY REPORT*  Clinical Data: Fall today.  Lower extremity bruising.  RIGHT TIBIA AND FIBULA - 2 VIEW  Comparison: None.  Findings: The bones appears severely demineralized.  No acute fracture or dislocation of the mid to distal lower legs is demonstrated.  The knees are excluded from these images, and discussed on the separate examination of the right femur. Scattered vascular calcifications are noted.  The positioning is suboptimal for evaluation of the ankle.  Joint space loss is noted throughout the hind foot.  IMPRESSION: No acute findings are identified within the lower leg.  Distal femoral/knee findings are reported separately.   Original Report Authenticated By: Carey Bullocks, M.D.   Ct Head Wo Contrast  01/28/2013   *RADIOLOGY REPORT*  Clinical Data:  Status post fall.  Hip injury.  Altered mental status.  History of dementia.  CT HEAD WITHOUT CONTRAST CT CERVICAL SPINE WITHOUT CONTRAST  Technique:  Multidetector CT imaging of the head and cervical spine was performed following the standard protocol without intravenous contrast.  Multiplanar CT image reconstructions of the cervical spine were also generated.  Comparison:  Head CT 11/27/2011.  Cervical spine CT 08/05/2011.  CT HEAD  Findings: There is progressive atrophy with diffuse prominence  of the ventricles and subarachnoid spaces.  Confluent low density in the periventricular white matter also appears mildly progressive. However, there is no evidence of acute intracranial hemorrhage, mass lesion, brain edema or extra-axial fluid collection.  There is no evidence of acute infarct.  The visualized paranasal sinuses are clear.  The calvarium is intact.  IMPRESSION:  1.  Progressive atrophy and chronic small vessel ischemic changes. 2.  No acute intracranial findings identified.  CT CERVICAL SPINE  Findings: The cervical alignment appears similar.  There is a retrolisthesis at C3-C4 associated with occult spinal dysraphism at C3. This results in chronic central and biforaminal stenosis. There is a mild anterolisthesis at C5-C6.  There is multilevel spondylosis.  No acute fracture or traumatic subluxation is identified.  No acute soft tissue findings are evident.  Carotid arterial calcifications are noted bilaterally.  IMPRESSION: No evidence of acute cervical spine fracture, traumatic subluxation or static signs of instability.  Stable alignment and multilevel spondylosis contributing to spinal stenosis at C3-C4.   Original Report Authenticated By: Carey Bullocks, M.D.   Ct Cervical Spine Wo Contrast  01/28/2013   *RADIOLOGY REPORT*  Clinical Data:  Status post fall.  Hip injury.  Altered mental status.  History of dementia.  CT HEAD WITHOUT CONTRAST CT CERVICAL SPINE WITHOUT CONTRAST  Technique:  Multidetector CT imaging of the head and cervical spine was performed following the standard protocol without intravenous contrast.  Multiplanar CT image reconstructions of the cervical spine were also generated.  Comparison:  Head CT 11/27/2011.  Cervical spine CT 08/05/2011.  CT HEAD  Findings: There is progressive atrophy with diffuse prominence  of the ventricles and subarachnoid spaces.  Confluent low density in the periventricular white matter also appears mildly progressive. However, there is no  evidence of acute intracranial hemorrhage, mass lesion, brain edema or extra-axial fluid collection.  There is no evidence of acute infarct.  The visualized paranasal sinuses are clear.  The calvarium is intact.  IMPRESSION:  1.  Progressive atrophy and chronic small vessel ischemic changes. 2.  No acute intracranial findings identified.  CT CERVICAL SPINE  Findings: The cervical alignment appears similar.  There is a retrolisthesis at C3-C4 associated with occult spinal dysraphism at C3. This results in chronic central and biforaminal stenosis. There is a mild anterolisthesis at C5-C6.  There is multilevel spondylosis.  No acute fracture or traumatic subluxation is identified.  No acute soft tissue findings are evident.  Carotid arterial calcifications are noted bilaterally.  IMPRESSION: No evidence of acute cervical spine fracture, traumatic subluxation or static signs of instability.  Stable alignment and multilevel spondylosis contributing to spinal stenosis at C3-C4.   Original Report Authenticated By: Carey Bullocks, M.D.         Subjective: Patient is awake and alert. She denies any chest pain, shortness of breath, leg pain. She only intermittently answers questions. No vomiting or respiratory distress.  Objective: Filed Vitals:   01/29/13 0400 01/29/13 0531 01/29/13 0800 01/29/13 1200  BP:  122/73    Pulse:  74    Temp:  98 F (36.7 C)    TempSrc:  Oral    Resp: 20 20 20 20   Height:      Weight:  64.2 kg (141 lb 8.6 oz)    SpO2: 97% 96% 96% 96%    Intake/Output Summary (Last 24 hours) at 01/29/13 1243 Last data filed at 01/29/13 0829  Gross per 24 hour  Intake    325 ml  Output      0 ml  Net    325 ml   Weight change:  Exam:   General:  Pt is alert, follows commands appropriately, not in acute distress  HEENT: No icterus, No thrush,  [place]/AT  Cardiovascular: RRR, S1/S2, no rubs, no gallops  Respiratory: CTA bilaterally, no wheezing, no crackles  Abdomen: Soft/+BS,  non tender, non distended, no guarding  Extremities: trace edema, No lymphangitis, No petechiae, No rashes, no synovitis; bilateral lower extremities in knee immobilizers  Data Reviewed: Basic Metabolic Panel:  Recent Labs Lab 01/28/13 1744 01/29/13 0730  NA 132* 135  K 4.8 4.5  CL 96 100  CO2 24 24  GLUCOSE 161* 117*  BUN 27* 29*  CREATININE 0.65 0.52  CALCIUM 9.2 8.4   Liver Function Tests:  Recent Labs Lab 01/28/13 1744  AST 27  ALT 22  ALKPHOS 124*  BILITOT 0.5  PROT 6.9  ALBUMIN 3.1*   No results found for this basename: LIPASE, AMYLASE,  in the last 168 hours No results found for this basename: AMMONIA,  in the last 168 hours CBC:  Recent Labs Lab 01/28/13 1744  WBC 6.3  NEUTROABS 4.0  HGB 11.5*  HCT 34.2*  MCV 80.3  PLT 310   Cardiac Enzymes: No results found for this basename: CKTOTAL, CKMB, CKMBINDEX, TROPONINI,  in the last 168 hours BNP: No components found with this basename: POCBNP,  CBG: No results found for this basename: GLUCAP,  in the last 168 hours  Recent Results (from the past 240 hour(s))  SURGICAL PCR SCREEN     Status: Abnormal   Collection Time  01/28/13  8:32 PM      Result Value Range Status   MRSA, PCR NEGATIVE  NEGATIVE Final   Staphylococcus aureus POSITIVE (*) NEGATIVE Final   Comment:            The Xpert SA Assay (FDA     approved for NASAL specimens     in patients over 65 years of age),     is one component of     a comprehensive surveillance     program.  Test performance has     been validated by [employer] for patients greater     than or equal to 100 year old.     It is not intended     to diagnose infection nor to     guide or monitor treatment.     RESULT CALLED TO, READ BACK BY AND VERIFIED WITH:     A.MOORE,RN 2214 01/28/13 M.CAMPBELL     Scheduled Meds: . cefTRIAXone (ROCEPHIN)  IV  1 g Intravenous Q24H  . Chlorhexidine Gluconate Cloth  6 each Topical Daily  . digoxin  0.0625 mg  Intravenous Daily  . heparin  5,000 Units Subcutaneous Q8H  . levothyroxine  12.5 mcg Intravenous Daily  . mupirocin ointment  1 application Nasal BID   Continuous Infusions:    Trustin Chapa, DO  [facility] Pager [phone]  If 7PM-7AM, please contact night-coverage [email or web address] Password [facility] 01/29/2013, 12:43 PM   LOS: 1 day

## 2013-01-30 DIAGNOSIS — E039 Hypothyroidism, unspecified: Secondary | ICD-10-CM

## 2013-01-30 DIAGNOSIS — I4891 Unspecified atrial fibrillation: Secondary | ICD-10-CM

## 2013-01-30 LAB — IRON AND TIBC
Saturation Ratios: 9 % — ABNORMAL LOW (ref 20–55)
TIBC: 198 ug/dL — ABNORMAL LOW (ref 250–470)
UIBC: 180 ug/dL (ref 125–400)

## 2013-01-30 LAB — BASIC METABOLIC PANEL
Calcium: 8.6 mg/dL (ref 8.4–10.5)
GFR calc Af Amer: >90 mL/min (ref >90)
GFR calc non Af Amer: 89 mL/min — ABNORMAL LOW (ref >90)
Glucose, Bld: 114 mg/dL — ABNORMAL HIGH (ref 70–99)
Potassium: 4.1 mEq/L (ref 3.5–5.1)
Sodium: 138 mEq/L (ref 135–145)

## 2013-01-30 LAB — CBC
HCT: 24.7 % — ABNORMAL LOW (ref 36.0–46.0)
MCHC: 33.2 g/dL (ref 30.0–36.0)
MCV: 82.1 fL (ref 78.0–100.0)
Platelets: 288 10*3/uL (ref 150–400)
RDW: 14.4 % (ref 11.5–15.5)
WBC: 9.8 10*3/uL (ref 4.0–10.5)

## 2013-01-30 LAB — URINE CULTURE: Colony Count: >=100000

## 2013-01-30 LAB — TSH: TSH: 4.216 u[IU]/mL (ref 0.350–4.500)

## 2013-01-30 MED ORDER — DIGOXIN 0.25 MG/ML IJ SOLN
0.2500 mg | Freq: Once | INTRAMUSCULAR | Status: AC
  Administered 2013-01-30: 0.25 mg via INTRAVENOUS
  Filled 2013-01-30: qty 1

## 2013-01-30 MED ORDER — LEVOTHYROXINE SODIUM 25 MCG PO TABS
25.0000 ug | ORAL_TABLET | Freq: Every day | ORAL | Status: DC
  Filled 2013-01-30 (×2): qty 1

## 2013-01-30 MED ORDER — METOPROLOL TARTRATE 1 MG/ML IV SOLN: 2.5000 mg | Freq: Once | INTRAVENOUS | Status: DC

## 2013-01-30 MED ORDER — METOPROLOL TARTRATE 1 MG/ML IV SOLN
1.5000 mg | Freq: Once | INTRAVENOUS | Status: AC
  Administered 2013-01-30: 1.5 mg via INTRAVENOUS
  Filled 2013-01-30: qty 5

## 2013-01-30 MED ORDER — DILTIAZEM HCL 30 MG PO TABS
30.0000 mg | ORAL_TABLET | Freq: Four times a day (QID) | ORAL | Status: DC
  Administered 2013-01-30 – 2013-01-31 (×3): 30 mg via ORAL
  Filled 2013-01-30 (×7): qty 1

## 2013-01-30 MED ORDER — SODIUM CHLORIDE 0.9 % IV SOLN
INTRAVENOUS | Status: DC
  Administered 2013-01-30: 16:00:00 via INTRAVENOUS

## 2013-01-30 MED ORDER — DIGOXIN 0.0625 MG HALF TABLET
0.0625 mg | ORAL_TABLET | Freq: Every day | ORAL | Status: DC
  Administered 2013-01-31: 0.0625 mg via ORAL
  Filled 2013-01-30: qty 1

## 2013-01-30 MED ORDER — SODIUM CHLORIDE 0.9 % IV SOLN
INTRAVENOUS | Status: DC
  Administered 2013-01-30: via INTRAVENOUS

## 2013-01-30 NOTE — Progress Notes (Signed)
Pt given 1mg  morphine at 2037. Pt HR sustaining in 125-135, up to 156 non-sustained. MD paged and new orders given for digoxin IV one time dose, to increase fluid rate. Pt given 1 Norco as well to help with pain. Will continue to monitor pt HR. Baron Hamper, RN 01/30/2013 10:41 PM

## 2013-01-30 NOTE — Progress Notes (Signed)
Utilization Review Completed.   Yuvia Plant, RN, BSN Nurse Case Manager  [phone]  

## 2013-01-30 NOTE — Progress Notes (Signed)
Patient heart rate sustaining 120's-140's.  Morphine given x1.  NP Craige Cotta notified. RN will continue to monitor. Louretta Parma, RN

## 2013-01-30 NOTE — Progress Notes (Signed)
Subjective: Sig c/o pain when moved   Objective: Vital signs in last 24 hours: Temp:  [97.5 F (36.4 C)-99.1 F (37.3 C)] 99.1 F (37.3 C) (06/03 0534) Pulse Rate:  [74-146] 146 (06/03 0557) Resp:  [19-20] 20 (06/03 0753) BP: (100-112)/(36-82) 109/46 mmHg (06/03 0534) SpO2:  [93 %-97 %] 95 % (06/03 0753) Weight:  [63.5 kg (139 lb 15.9 oz)] 63.5 kg (139 lb 15.9 oz) (06/03 0534)  Intake/Output from previous day: 06/02 0701 - 06/03 0700 In: 933.8 [P.O.:60; I.V.:873.8] Out: 400 [Urine:400] Intake/Output this shift:     Recent Labs  01/28/13 1744  HGB 11.5*    Recent Labs  01/28/13 1744  WBC 6.3  RBC 4.26  HCT 34.2*  PLT 310    Recent Labs  01/29/13 0730 01/30/13 0540  NA 135 138  K 4.5 4.1  CL 100 104  CO2 24 22  BUN 29* 36*  CREATININE 0.52 0.51  GLUCOSE 117* 114*  CALCIUM 8.4 8.6   No results found for this basename: LABPT, INR,  in the last 72 hours  Intact pulses distally Dorsiflexion/Plantar flexion intact Compartment soft  Assessment/Plan: bilat femur fractures plan non-op trteatment but will discuss with int med team and family if able to get ahold of them   Chele Cornell L 01/30/2013, 8:40 AM

## 2013-01-30 NOTE — Progress Notes (Addendum)
Called to see the patient because of the patient's continued heart rate elevation despite digoxin 0.86mcg IV x 1 today.  For the most part, pt HR has been in 110-120 range after digoxin.  I personally checked BP--99/65.  Patient is more awake and alert then this morning  although remains pleasantly confused. She is difficult to understand, but denies any chest discomfort or shortness of breath.  CV IRRR Lungs, CTA Abd--soft +BS/NT Ext--no edema  Will start IV NS at 75cc/hr and start diltiazem 30mg  po q 6hrs.  I spoke with ortho, Dr. Luiz Blare today whom is considering IM rod in am 01/31/13 if family agrees.  Would hold OR procedure in am if HR still poorly controlled.  Continue ceftriaxone for [facility] UTI.  Hemoccult stool although drop in Hgb likely dilution.  Am CBC  DTat

## 2013-01-30 NOTE — Progress Notes (Signed)
Patient's consent form sign via telephone with son Destane Speas.  Verified by charge nurse Nino Glow RN; will continue to monitor patient.  Lorretta Harp RN

## 2013-01-30 NOTE — Progress Notes (Signed)
[facility] PROGRESS NOTE  Andrea Koch LKG:[id number] DOB: Jun 03, 1932 DOA: 01/28/2013 PCP: Provider Not In System  Assessment/Plan: Femur fracture, left and right  -Appreciate orthopedic followup  -Non-operative treatment per Dr. Luiz Blare  -PT eval  -Pain control  -DVT prophylaxis  UTI  -Continue ceftriaxone pending urine culture  Paroxysmal atrial fibrillation -developed RVR am 01/30/13--HR 140>>120s with lopressor x 1 IV -digoxin level 0.3 on day of admission -try digoxin 0.25mg  IV x 1 today, then back to baseline dose 01/31/13  -Continue digoxin  -TSH, CBC, EKG today -Not on anticoagulation  -manual BP (110/60) Hypothyroidism  -change synthroid to po Dementia  -Stable  Microcytic anemia  -Check iron studies  Family Communication: updated son--confirmed DNR Disposition Plan: SNF possibly 01/31/13 [facility])      Procedures/Studies: Dg Chest 1 View  01/28/2013   *RADIOLOGY REPORT*  Clinical Data: Bilateral thigh pain status post fall today.  CHEST - 1 VIEW  Comparison: 11/27/2011 and 06/17/2010.  Findings: Right subclavian pacemaker leads appear unchanged within the right atrium and ventricle.  The heart size and mediastinal contours are stable.  There is a questionable new nodular density in the left suprahilar region adjacent to the aortic arch.  This could be related to the first costochondral junction and different positioning.  The lungs are otherwise clear.  There is no pleural effusion or pneumothorax.  Osteophytes are noted throughout the thoracic spine.  IMPRESSION:  1.  No acute findings identified. 2.  Cannot exclude a developing nodule in the left suprahilar region.  This could be due to the costochondral junction and positional differences.  Attention to this area on follow-up recommended.   Original Report Authenticated By: Carey Bullocks, M.D.   Dg Pelvis 1-2 Views  01/28/2013   *RADIOLOGY REPORT*  Clinical Data: Fall and hip injury  PELVIS - 1-2 VIEW   Comparison: 05/24/2009  Findings: Thoracic spine incompletely imaged, with suggestion of central possible endplate compression deformities, not further evaluated on this exam, unchanged.  Moderate stool volume projecting over descending and sigmoid colon.  No displaced pelvic fracture identified.  IMPRESSION: No displaced pelvic fracture.  Stable appearance to the inferior lumbar spine with suggestion of central endplate compression deformities again noted.   Original Report Authenticated By: Christiana Pellant, M.D.   Dg Femur Left  01/28/2013   *RADIOLOGY REPORT*  Clinical Data: Fall, leg pain  LEFT FEMUR - 2 VIEW  Comparison: Pelvic radiograph same date, prior exam 05/24/2009  Findings: Mild left hip degenerative change is identified. Bones are osteopenic, which may mask subtle fracture.  Moderate stool volume noted over the sigmoid and descending colon.  Lumbar spine central compression deformities re-identified.  Vascular calcifications noted. There is a comminuted oblique distal left femoral fracture with approximately one half shaft width overlap of the fracture fragments.  Tricompartmental degenerative changes are noted in the knee.  Mild varus angulation at the fracture site. Prepatellar soft tissue swelling is identified.  IMPRESSION: A comminuted oblique distal left femoral fracture without apparent intra-articular extension.   Original Report Authenticated By: Christiana Pellant, M.D.   Dg Femur Right  01/28/2013   *RADIOLOGY REPORT*  Clinical Data: Fall today with bilateral femur pain.  RIGHT FEMUR - 2 VIEW  Comparison: None.  Findings: Mild to moderate osteopenia.  Extensive artifact , presumably due to clothing.  A markedly comminuted and angulated fracture of the distal femur, suboptimally evaluated. Intra- articular extension.  The more proximal and mid femoral shaft demonstrates no fracture.  Suboptimal evaluation of the  proximal tibia and fibula.  IMPRESSION: Moderately degraded secondary to artifact  distally.  Comminuted intra-articular distal femur fracture.  Recommend dedicated knee films.  The more proximal femur demonstrates no acute finding.   Original Report Authenticated By: Jeronimo Greaves, M.D.   Dg Tibia/fibula Left  01/28/2013   *RADIOLOGY REPORT*  Clinical Data: Fall today.  Lower leg bruising.  LEFT TIBIA AND FIBULA - 2 VIEW  Comparison: None.  Findings: The knees are excluded from this examination and reported separately.  The bones are diffusely demineralized.  There is no evidence of acute fracture or dislocation in the lower leg. Vascular calcifications are noted.  There is joint space loss within the hind foot.  Scattered vascular calcifications are noted.  IMPRESSION: No evidence of left lower leg acute fracture or dislocation. Distal femoral/knee findings are dictated separately.   Original Report Authenticated By: Carey Bullocks, M.D.   Dg Tibia/fibula Right  01/28/2013   *RADIOLOGY REPORT*  Clinical Data: Fall today.  Lower extremity bruising.  RIGHT TIBIA AND FIBULA - 2 VIEW  Comparison: None.  Findings: The bones appears severely demineralized.  No acute fracture or dislocation of the mid to distal lower legs is demonstrated.  The knees are excluded from these images, and discussed on the separate examination of the right femur. Scattered vascular calcifications are noted.  The positioning is suboptimal for evaluation of the ankle.  Joint space loss is noted throughout the hind foot.  IMPRESSION: No acute findings are identified within the lower leg.  Distal femoral/knee findings are reported separately.   Original Report Authenticated By: Carey Bullocks, M.D.   Ct Head Wo Contrast  01/28/2013   *RADIOLOGY REPORT*  Clinical Data:  Status post fall.  Hip injury.  Altered mental status.  History of dementia.  CT HEAD WITHOUT CONTRAST CT CERVICAL SPINE WITHOUT CONTRAST  Technique:  Multidetector CT imaging of the head and cervical spine was performed following the standard protocol  without intravenous contrast.  Multiplanar CT image reconstructions of the cervical spine were also generated.  Comparison:  Head CT 11/27/2011.  Cervical spine CT 08/05/2011.  CT HEAD  Findings: There is progressive atrophy with diffuse prominence of the ventricles and subarachnoid spaces.  Confluent low density in the periventricular white matter also appears mildly progressive. However, there is no evidence of acute intracranial hemorrhage, mass lesion, brain edema or extra-axial fluid collection.  There is no evidence of acute infarct.  The visualized paranasal sinuses are clear.  The calvarium is intact.  IMPRESSION:  1.  Progressive atrophy and chronic small vessel ischemic changes. 2.  No acute intracranial findings identified.  CT CERVICAL SPINE  Findings: The cervical alignment appears similar.  There is a retrolisthesis at C3-C4 associated with occult spinal dysraphism at C3. This results in chronic central and biforaminal stenosis. There is a mild anterolisthesis at C5-C6.  There is multilevel spondylosis.  No acute fracture or traumatic subluxation is identified.  No acute soft tissue findings are evident.  Carotid arterial calcifications are noted bilaterally.  IMPRESSION: No evidence of acute cervical spine fracture, traumatic subluxation or static signs of instability.  Stable alignment and multilevel spondylosis contributing to spinal stenosis at C3-C4.   Original Report Authenticated By: Carey Bullocks, M.D.   Ct Cervical Spine Wo Contrast  01/28/2013   *RADIOLOGY REPORT*  Clinical Data:  Status post fall.  Hip injury.  Altered mental status.  History of dementia.  CT HEAD WITHOUT CONTRAST CT CERVICAL SPINE WITHOUT CONTRAST  Technique:  Multidetector CT  imaging of the head and cervical spine was performed following the standard protocol without intravenous contrast.  Multiplanar CT image reconstructions of the cervical spine were also generated.  Comparison:  Head CT 11/27/2011.  Cervical spine CT  08/05/2011.  CT HEAD  Findings: There is progressive atrophy with diffuse prominence of the ventricles and subarachnoid spaces.  Confluent low density in the periventricular white matter also appears mildly progressive. However, there is no evidence of acute intracranial hemorrhage, mass lesion, brain edema or extra-axial fluid collection.  There is no evidence of acute infarct.  The visualized paranasal sinuses are clear.  The calvarium is intact.  IMPRESSION:  1.  Progressive atrophy and chronic small vessel ischemic changes. 2.  No acute intracranial findings identified.  CT CERVICAL SPINE  Findings: The cervical alignment appears similar.  There is a retrolisthesis at C3-C4 associated with occult spinal dysraphism at C3. This results in chronic central and biforaminal stenosis. There is a mild anterolisthesis at C5-C6.  There is multilevel spondylosis.  No acute fracture or traumatic subluxation is identified.  No acute soft tissue findings are evident.  Carotid arterial calcifications are noted bilaterally.  IMPRESSION: No evidence of acute cervical spine fracture, traumatic subluxation or static signs of instability.  Stable alignment and multilevel spondylosis contributing to spinal stenosis at C3-C4.   Original Report Authenticated By: Carey Bullocks, M.D.         Subjective: Patient is awake and alert. She is pleasantly confused. She intermittently answers questions, But most of time the patient mumbles. No reports of vomiting or diarrhea. No respiratory distress.  Objective: Filed Vitals:   01/30/13 0400 01/30/13 0534 01/30/13 0557 01/30/13 0753  BP:  109/46    Pulse:  76 146   Temp:  99.1 F (37.3 C)    TempSrc:  Axillary    Resp: 20 20  20   Height:      Weight:  63.5 kg (139 lb 15.9 oz)    SpO2: 94% 95%  95%    Intake/Output Summary (Last 24 hours) at 01/30/13 0807 Last data filed at 01/30/13 0533  Gross per 24 hour  Intake 933.75 ml  Output    400 ml  Net 533.75 ml    Weight change: -0.8 kg (-1 lb 12.2 oz) Exam:   General:  Pt is alert, , not in acute distress  HEENT: No icterus, No thrush, [place]/AT  Cardiovascular: IRRR,no rubs, no gallops  Respiratory: CTA bilaterally, no wheezing, no crackles, no rhonchi  Abdomen: Soft/+BS, non tender, non distended, no guarding  Extremities: No edema, No lymphangitis, No petechiae, No rashes, bilateral knee immobilizers in place.  Data Reviewed: Basic Metabolic Panel:  Recent Labs Lab 01/28/13 1744 01/29/13 0730 01/30/13 0540  NA 132* 135 138  K 4.8 4.5 4.1  CL 96 100 104  CO2 24 24 22   GLUCOSE 161* 117* 114*  BUN 27* 29* 36*  CREATININE 0.65 0.52 0.51  CALCIUM 9.2 8.4 8.6  MG  --   --  2.2   Liver Function Tests:  Recent Labs Lab 01/28/13 1744  AST 27  ALT 22  ALKPHOS 124*  BILITOT 0.5  PROT 6.9  ALBUMIN 3.1*   No results found for this basename: LIPASE, AMYLASE,  in the last 168 hours No results found for this basename: AMMONIA,  in the last 168 hours CBC:  Recent Labs Lab 01/28/13 1744  WBC 6.3  NEUTROABS 4.0  HGB 11.5*  HCT 34.2*  MCV 80.3  PLT 310  Cardiac Enzymes: No results found for this basename: CKTOTAL, CKMB, CKMBINDEX, TROPONINI,  in the last 168 hours BNP: No components found with this basename: POCBNP,  CBG: No results found for this basename: GLUCAP,  in the last 168 hours  Recent Results (from the past 240 hour(s))  SURGICAL PCR SCREEN     Status: Abnormal   Collection Time    01/28/13  8:32 PM      Result Value Range Status   MRSA, PCR NEGATIVE  NEGATIVE Final   Staphylococcus aureus POSITIVE (*) NEGATIVE Final   Comment:            The Xpert SA Assay (FDA     approved for NASAL specimens     in patients over 13 years of age),     is one component of     a comprehensive surveillance     program.  Test performance has     been validated by [employer] for patients greater     than or equal to 64 year old.     It is not intended     to  diagnose infection nor to     guide or monitor treatment.     RESULT CALLED TO, READ BACK BY AND VERIFIED WITH:     A.MOORE,RN [id number] 01/28/13 M.[facility]  URINE CULTURE     Status: None   Collection Time    01/28/13  9:42 PM      Result Value Range Status   Specimen Description URINE, CATHETERIZED   Final   Special Requests NONE   Final   Culture  Setup Time 01/29/2013 02:37   Final   Colony Count >=100,000 COLONIES/ML   Final   Culture GRAM NEGATIVE RODS   Final   Report Status PENDING   Incomplete     Scheduled Meds: . cefTRIAXone (ROCEPHIN)  IV  1 g Intravenous Q24H  . Chlorhexidine Gluconate Cloth  6 each Topical Daily  . digoxin  0.25 mg Intravenous Once  . heparin  5,000 Units Subcutaneous Q8H  . levothyroxine  12.5 mcg Intravenous Daily  . mupirocin ointment  1 application Nasal BID   Continuous Infusions: . sodium chloride 75 mL/hr at 01/30/13 0510     Leo Weyandt, DO  [facility] Pager [phone]  If 7PM-7AM, please contact night-coverage [email or web address] Password [email or web address] 01/30/2013, 8:07 AM   LOS: 2 days

## 2013-01-30 NOTE — Progress Notes (Signed)
Spoke with pt son Lella Mullany and obtained Blood consent per telephone. Pt is oriented only to self. Bethanne Ginger, RN witnessed consent via telephone with Baron Hamper, RN. Consent form placed in chart.

## 2013-01-30 NOTE — Progress Notes (Signed)
Patient is a 79 year old female- resident of [facility]- SNF level of care.  CSW spoke with the admissions director of [facility]. She states that bed is not being held at facility but they will gladly accept patient back when medically stable.  CSW will need to discuss d/c plan with patient and her son.  Fl2 placed on shadow chart for MD's signature.  CSW Psychosocial Assessment to follow.  Lorri Frederick. [place]  [phone]

## 2013-01-31 ENCOUNTER — Encounter (HOSPITAL_COMMUNITY): Payer: Self-pay | Admitting: Anesthesiology

## 2013-01-31 ENCOUNTER — Encounter (HOSPITAL_COMMUNITY)
Admission: EM | Disposition: A | Discharge status: Skilled Nursing Facility | Payer: Self-pay | Source: Home / Self Care | Attending: Internal Medicine

## 2013-01-31 ENCOUNTER — Inpatient Hospital Stay (HOSPITAL_COMMUNITY): Payer: Medicare Other | Admitting: Anesthesiology

## 2013-01-31 ENCOUNTER — Inpatient Hospital Stay (HOSPITAL_COMMUNITY): Payer: Medicare Other

## 2013-01-31 DIAGNOSIS — R609 Edema, unspecified: Secondary | ICD-10-CM

## 2013-01-31 DIAGNOSIS — I519 Heart disease, unspecified: Secondary | ICD-10-CM

## 2013-01-31 HISTORY — PX: FEMUR IM NAIL: SHX1597

## 2013-01-31 LAB — BASIC METABOLIC PANEL
BUN: 38 mg/dL — ABNORMAL HIGH (ref 6–23)
Calcium: 8.5 mg/dL (ref 8.4–10.5)
GFR calc non Af Amer: 90 mL/min — ABNORMAL LOW (ref >90)
Glucose, Bld: 110 mg/dL — ABNORMAL HIGH (ref 70–99)

## 2013-01-31 SURGERY — INSERTION, INTRAMEDULLARY ROD, FEMUR, RETROGRADE
Anesthesia: General | Site: Leg Upper | Laterality: Left | Wound class: Clean

## 2013-01-31 SURGERY — CANCELLED PROCEDURE
Laterality: Right

## 2013-01-31 MED ORDER — DEXTROSE 5 % IV SOLN
1.0000 g | Freq: Once | INTRAVENOUS | Status: DC
  Filled 2013-01-31: qty 10

## 2013-01-31 MED ORDER — ADULT MULTIVITAMIN W/MINERALS CH
1.0000 | ORAL_TABLET | Freq: Every day | ORAL | Status: DC
  Administered 2013-02-03 – 2013-02-05 (×3): 1 via ORAL
  Filled 2013-01-31 (×5): qty 1

## 2013-01-31 MED ORDER — BISACODYL 10 MG RE SUPP
10.0000 mg | Freq: Every day | RECTAL | Status: DC | PRN
  Administered 2013-02-04: 10 mg via RECTAL
  Filled 2013-01-31: qty 1

## 2013-01-31 MED ORDER — LACTATED RINGERS IV SOLN
INTRAVENOUS | Status: DC | PRN
  Administered 2013-01-31: 11:00:00 via INTRAVENOUS

## 2013-01-31 MED ORDER — ACETAMINOPHEN 650 MG RE SUPP: 650.0000 mg | Freq: Four times a day (QID) | RECTAL | Status: DC | PRN

## 2013-01-31 MED ORDER — HYDROMORPHONE HCL PF 1 MG/ML IJ SOLN
INTRAMUSCULAR | Status: AC
  Filled 2013-01-31: qty 1

## 2013-01-31 MED ORDER — FENTANYL CITRATE 0.05 MG/ML IJ SOLN
INTRAMUSCULAR | Status: DC | PRN
  Administered 2013-01-31: 50 ug via INTRAVENOUS
  Administered 2013-01-31: 25 ug via INTRAVENOUS
  Administered 2013-01-31 (×5): 50 ug via INTRAVENOUS
  Administered 2013-01-31 (×3): 25 ug via INTRAVENOUS

## 2013-01-31 MED ORDER — SODIUM CHLORIDE 0.9 % IV SOLN
INTRAVENOUS | Status: DC | PRN
  Administered 2013-01-31: 13:00:00 via INTRAVENOUS

## 2013-01-31 MED ORDER — HYDROMORPHONE HCL PF 1 MG/ML IJ SOLN
0.2500 mg | INTRAMUSCULAR | Status: DC | PRN
  Administered 2013-01-31: 0.25 mg via INTRAVENOUS

## 2013-01-31 MED ORDER — ALUM & MAG HYDROXIDE-SIMETH 200-200-20 MG/5ML PO SUSP: 30.0000 mL | ORAL | Status: DC | PRN

## 2013-01-31 MED ORDER — OXYCODONE HCL 5 MG PO TABS: 5.0000 mg | ORAL_TABLET | Freq: Once | ORAL | Status: AC | PRN

## 2013-01-31 MED ORDER — ROCURONIUM BROMIDE 100 MG/10ML IV SOLN
INTRAVENOUS | Status: DC | PRN
  Administered 2013-01-31: 30 mg via INTRAVENOUS
  Administered 2013-01-31 (×2): 5 mg via INTRAVENOUS

## 2013-01-31 MED ORDER — DIGOXIN 0.0625 MG HALF TABLET: 62.5000 ug | ORAL_TABLET | Freq: Every day | ORAL | Status: DC

## 2013-01-31 MED ORDER — LACTATED RINGERS IV SOLN
INTRAVENOUS | Status: DC
  Administered 2013-01-31: 11:00:00 via INTRAVENOUS

## 2013-01-31 MED ORDER — SUCCINYLCHOLINE CHLORIDE 20 MG/ML IJ SOLN
INTRAMUSCULAR | Status: DC | PRN
  Administered 2013-01-31: 100 mg via INTRAVENOUS

## 2013-01-31 MED ORDER — OXYCODONE HCL 5 MG/5ML PO SOLN
5.0000 mg | Freq: Once | ORAL | Status: AC | PRN
  Administered 2013-01-31: 5 mg via ORAL

## 2013-01-31 MED ORDER — FUROSEMIDE 20 MG PO TABS
20.0000 mg | ORAL_TABLET | Freq: Every day | ORAL | Status: DC
  Filled 2013-01-31 (×2): qty 1

## 2013-01-31 MED ORDER — PROPOFOL 10 MG/ML IV BOLUS
INTRAVENOUS | Status: DC | PRN
  Administered 2013-01-31: 10 mg via INTRAVENOUS
  Administered 2013-01-31: 100 mg via INTRAVENOUS

## 2013-01-31 MED ORDER — SODIUM CHLORIDE 0.9 % IV SOLN
INTRAVENOUS | Status: DC
  Administered 2013-01-31 – 2013-02-02 (×4): via INTRAVENOUS

## 2013-01-31 MED ORDER — ONDANSETRON HCL 4 MG PO TABS: 4.0000 mg | ORAL_TABLET | Freq: Four times a day (QID) | ORAL | Status: DC | PRN

## 2013-01-31 MED ORDER — CEFAZOLIN SODIUM-DEXTROSE 2-3 GM-% IV SOLR
2.0000 g | Freq: Four times a day (QID) | INTRAVENOUS | Status: AC
  Administered 2013-01-31 – 2013-02-01 (×2): 2 g via INTRAVENOUS
  Filled 2013-01-31 (×2): qty 50

## 2013-01-31 MED ORDER — NEOSTIGMINE METHYLSULFATE 1 MG/ML IJ SOLN
INTRAMUSCULAR | Status: DC | PRN
  Administered 2013-01-31: 3 mg via INTRAVENOUS

## 2013-01-31 MED ORDER — MEPERIDINE HCL 25 MG/ML IJ SOLN: 6.2500 mg | INTRAMUSCULAR | Status: DC | PRN

## 2013-01-31 MED ORDER — OXYCODONE HCL 5 MG/5ML PO SOLN
ORAL | Status: AC
  Filled 2013-01-31: qty 5

## 2013-01-31 MED ORDER — FLEET ENEMA 7-19 GM/118ML RE ENEM: 1.0000 | ENEMA | Freq: Once | RECTAL | Status: AC | PRN

## 2013-01-31 MED ORDER — EPHEDRINE SULFATE 50 MG/ML IJ SOLN
INTRAMUSCULAR | Status: DC | PRN
  Administered 2013-01-31: 10 mg via INTRAVENOUS

## 2013-01-31 MED ORDER — ACETAMINOPHEN 325 MG PO TABS: 650.0000 mg | ORAL_TABLET | Freq: Four times a day (QID) | ORAL | Status: DC | PRN

## 2013-01-31 MED ORDER — 0.9 % SODIUM CHLORIDE (POUR BTL) OPTIME
TOPICAL | Status: DC | PRN
  Administered 2013-01-31: 1000 mL

## 2013-01-31 MED ORDER — LEVOTHYROXINE SODIUM 25 MCG PO TABS
25.0000 ug | ORAL_TABLET | Freq: Every day | ORAL | Status: DC
  Administered 2013-02-02 – 2013-02-05 (×4): 25 ug via ORAL
  Filled 2013-01-31 (×7): qty 1

## 2013-01-31 MED ORDER — KCL IN DEXTROSE-NACL 10-5-0.45 MEQ/L-%-% IV SOLN
INTRAVENOUS | Status: DC
  Administered 2013-01-31: 10:00:00 via INTRAVENOUS
  Filled 2013-01-31 (×2): qty 1000

## 2013-01-31 MED ORDER — POLYETHYLENE GLYCOL 3350 17 G PO PACK
17.0000 g | PACK | Freq: Every day | ORAL | Status: DC | PRN
  Filled 2013-01-31: qty 1

## 2013-01-31 MED ORDER — ACETAMINOPHEN 325 MG PO TABS
650.0000 mg | ORAL_TABLET | Freq: Four times a day (QID) | ORAL | Status: DC | PRN
  Administered 2013-02-01: 325 mg via ORAL
  Filled 2013-01-31: qty 2

## 2013-01-31 MED ORDER — DILTIAZEM HCL 25 MG/5ML IV SOLN
5.0000 mg | Freq: Four times a day (QID) | INTRAVENOUS | Status: DC
  Administered 2013-01-31: 5 mg via INTRAVENOUS
  Filled 2013-01-31 (×6): qty 5

## 2013-01-31 MED ORDER — CLONAZEPAM 0.5 MG PO TABS: 0.5000 mg | ORAL_TABLET | Freq: Four times a day (QID) | ORAL | Status: DC | PRN

## 2013-01-31 MED ORDER — ONDANSETRON HCL 4 MG/2ML IJ SOLN: 4.0000 mg | Freq: Once | INTRAMUSCULAR | Status: DC | PRN

## 2013-01-31 MED ORDER — ONDANSETRON HCL 4 MG/2ML IJ SOLN
INTRAMUSCULAR | Status: DC | PRN
  Administered 2013-01-31: 4 mg via INTRAVENOUS

## 2013-01-31 MED ORDER — ONDANSETRON HCL 4 MG/2ML IJ SOLN: 4.0000 mg | Freq: Four times a day (QID) | INTRAMUSCULAR | Status: DC | PRN

## 2013-01-31 MED ORDER — SODIUM CHLORIDE 0.9 % IV SOLN
10.0000 mg | INTRAVENOUS | Status: DC | PRN
  Administered 2013-01-31: 20 ug/min via INTRAVENOUS

## 2013-01-31 MED ORDER — GLYCOPYRROLATE 0.2 MG/ML IJ SOLN
INTRAMUSCULAR | Status: DC | PRN
  Administered 2013-01-31: 0.6 mg via INTRAVENOUS

## 2013-01-31 MED ORDER — HYDROCODONE-ACETAMINOPHEN 5-325 MG PO TABS: 1.0000 | ORAL_TABLET | Freq: Four times a day (QID) | ORAL | Status: DC | PRN

## 2013-01-31 MED ORDER — MORPHINE SULFATE 2 MG/ML IJ SOLN
2.0000 mg | INTRAMUSCULAR | Status: DC | PRN
  Administered 2013-01-31 (×3): 2 mg via INTRAVENOUS
  Administered 2013-02-01: 1 mg via INTRAVENOUS
  Administered 2013-02-01 (×2): 2 mg via INTRAVENOUS
  Administered 2013-02-01: 1 mg via INTRAVENOUS
  Administered 2013-02-02 – 2013-02-05 (×10): 2 mg via INTRAVENOUS
  Filled 2013-01-31 (×18): qty 1

## 2013-01-31 MED ORDER — DEXTROSE 5 % IV SOLN
1.0000 g | INTRAVENOUS | Status: DC
  Administered 2013-01-31: 1 g via INTRAVENOUS

## 2013-01-31 MED ORDER — FLINTSTONES COMPLETE 60 MG PO CHEW: 1.0000 | CHEWABLE_TABLET | Freq: Every day | ORAL | Status: DC

## 2013-01-31 MED ORDER — LIDOCAINE HCL 4 % MT SOLN
OROMUCOSAL | Status: DC | PRN
  Administered 2013-01-31: 4 mL via TOPICAL

## 2013-01-31 MED ORDER — LACTATED RINGERS IV SOLN
INTRAVENOUS | Status: DC | PRN
  Administered 2013-01-31: 12:00:00 via INTRAVENOUS

## 2013-01-31 MED ORDER — DOCUSATE SODIUM 100 MG PO CAPS
100.0000 mg | ORAL_CAPSULE | Freq: Two times a day (BID) | ORAL | Status: DC
  Administered 2013-02-02 – 2013-02-05 (×5): 100 mg via ORAL
  Filled 2013-01-31 (×7): qty 1

## 2013-01-31 SURGICAL SUPPLY — 70 items
BANDAGE ELASTIC 4 VELCRO ST LF (GAUZE/BANDAGES/DRESSINGS) ×3 IMPLANT
BANDAGE ELASTIC 6 VELCRO ST LF (GAUZE/BANDAGES/DRESSINGS) ×3 IMPLANT
BANDAGE ESMARK 6X9 LF (GAUZE/BANDAGES/DRESSINGS) ×2 IMPLANT
BANDAGE GAUZE ELAST BULKY 4 IN (GAUZE/BANDAGES/DRESSINGS) ×3 IMPLANT
BIT DRILL CALIBRATED 4.3MMX365 (DRILL) ×2 IMPLANT
BIT DRILL CROWE PNT TWST 4.5MM (DRILL) ×2 IMPLANT
BLADE SURG 15 STRL LF DISP TIS (BLADE) ×2 IMPLANT
BLADE SURG 15 STRL SS (BLADE) ×1
BLADE SURG ROTATE 9660 (MISCELLANEOUS) ×3 IMPLANT
BNDG COHESIVE 6X5 TAN STRL LF (GAUZE/BANDAGES/DRESSINGS) ×3 IMPLANT
BNDG ESMARK 6X9 LF (GAUZE/BANDAGES/DRESSINGS) ×3
CLOTH BEACON ORANGE TIMEOUT ST (SAFETY) ×6 IMPLANT
COVER SURGICAL LIGHT HANDLE (MISCELLANEOUS) ×6 IMPLANT
CUFF TOURNIQUET SINGLE 34IN LL (TOURNIQUET CUFF) ×3 IMPLANT
CUFF TOURNIQUET SINGLE 44IN (TOURNIQUET CUFF) IMPLANT
DECANTER SPIKE VIAL GLASS SM (MISCELLANEOUS) ×3 IMPLANT
DRAPE BILATERAL SPLIT (DRAPES) ×6 IMPLANT
DRAPE C-ARM 42X72 X-RAY (DRAPES) ×3 IMPLANT
DRAPE ORTHO SPLIT 77X108 STRL (DRAPES) ×2
DRAPE PROXIMA HALF (DRAPES) ×6 IMPLANT
DRAPE STERI IOBAN 125X83 (DRAPES) ×3 IMPLANT
DRAPE SURG ORHT 6 SPLT 77X108 (DRAPES) ×4 IMPLANT
DRAPE U-SHAPE 47X51 STRL (DRAPES) ×12 IMPLANT
DRILL CALIBRATED 4.3MMX365 (DRILL) ×3
DRILL CROWE POINT TWIST 4.5MM (DRILL) ×3
DRSG MEPILEX BORDER 4X4 (GAUZE/BANDAGES/DRESSINGS) ×3 IMPLANT
DRSG MEPILEX BORDER 4X8 (GAUZE/BANDAGES/DRESSINGS) ×3 IMPLANT
DURAPREP 26ML APPLICATOR (WOUND CARE) ×6 IMPLANT
ELECT CAUTERY BLADE 6.4 (BLADE) ×3 IMPLANT
ELECT REM PT RETURN 9FT ADLT (ELECTROSURGICAL) ×6
ELECTRODE REM PT RTRN 9FT ADLT (ELECTROSURGICAL) ×4 IMPLANT
EVACUATOR 1/8 PVC DRAIN (DRAIN) IMPLANT
GAUZE XEROFORM 1X8 LF (GAUZE/BANDAGES/DRESSINGS) ×3 IMPLANT
GAUZE XEROFORM 5X9 LF (GAUZE/BANDAGES/DRESSINGS) ×3 IMPLANT
GLOVE BIOGEL PI IND STRL 8 (GLOVE) ×8 IMPLANT
GLOVE BIOGEL PI INDICATOR 8 (GLOVE) ×4
GLOVE ECLIPSE 7.5 STRL STRAW (GLOVE) ×12 IMPLANT
GOWN PREVENTION PLUS XLARGE (GOWN DISPOSABLE) ×6 IMPLANT
GOWN SRG XL XLNG 56XLVL 4 (GOWN DISPOSABLE) ×4 IMPLANT
GOWN STRL NON-REIN LRG LVL3 (GOWN DISPOSABLE) ×6 IMPLANT
GOWN STRL NON-REIN XL XLG LVL4 (GOWN DISPOSABLE) ×2
GUIDEPIN 3.2X17.5 THRD DISP (PIN) ×3 IMPLANT
GUIDEWIRE BEAD TIP (WIRE) ×3 IMPLANT
KIT BASIN OR (CUSTOM PROCEDURE TRAY) ×6 IMPLANT
KIT ROOM TURNOVER OR (KITS) ×6 IMPLANT
MANIFOLD NEPTUNE II (INSTRUMENTS) ×6 IMPLANT
NAIL FEM RETRO 12X280 (Nail) ×3 IMPLANT
NAIL FEM RETRO 13.5X300 (Nail) ×3 IMPLANT
NEEDLE 22X1 1/2 (OR ONLY) (NEEDLE) ×3 IMPLANT
NS IRRIG 1000ML POUR BTL (IV SOLUTION) ×6 IMPLANT
PACK GENERAL/GYN (CUSTOM PROCEDURE TRAY) ×6 IMPLANT
PAD ARMBOARD 7.5X6 YLW CONV (MISCELLANEOUS) ×12 IMPLANT
SCREW CORT TI DBL LEAD 5X34 (Screw) ×6 IMPLANT
SCREW CORT TI DBL LEAD 5X42 (Screw) ×3 IMPLANT
SCREW CORT TI DBL LEAD 5X60 (Screw) ×6 IMPLANT
SCREW CORT TI DBL LEAD 5X65 (Screw) ×3 IMPLANT
SCREW CORT TI DBL LEAD 5X70 (Screw) ×3 IMPLANT
SCREW CORT TI DBL LEAD 5X75 (Screw) ×3 IMPLANT
SCREW CORT TI DBL LEAD 5X80 (Screw) ×3 IMPLANT
SCREW CORT TI DBLE LEAD 5X54 (Screw) ×6 IMPLANT
SPONGE GAUZE 4X4 12PLY (GAUZE/BANDAGES/DRESSINGS) ×6 IMPLANT
STAPLER VISISTAT 35W (STAPLE) ×6 IMPLANT
STOCKINETTE IMPERVIOUS LG (DRAPES) ×3 IMPLANT
SUT VIC AB 0 CTB1 27 (SUTURE) ×9 IMPLANT
SUT VIC AB 1 CTB1 27 (SUTURE) ×3 IMPLANT
SUT VIC AB 2-0 CTB1 (SUTURE) ×6 IMPLANT
SYR CONTROL 10ML LL (SYRINGE) ×3 IMPLANT
TOWEL OR 17X24 6PK STRL BLUE (TOWEL DISPOSABLE) ×6 IMPLANT
TOWEL OR 17X26 10 PK STRL BLUE (TOWEL DISPOSABLE) ×6 IMPLANT
WATER STERILE IRR 1000ML POUR (IV SOLUTION) ×6 IMPLANT

## 2013-01-31 SURGICAL SUPPLY — 33 items
BLADE SURG ROTATE 9660 (MISCELLANEOUS) ×2 IMPLANT
CLOTH BEACON ORANGE TIMEOUT ST (SAFETY) ×2 IMPLANT
COVER SURGICAL LIGHT HANDLE (MISCELLANEOUS) ×4 IMPLANT
DECANTER SPIKE VIAL GLASS SM (MISCELLANEOUS) ×2 IMPLANT
DRAPE STERI IOBAN 125X83 (DRAPES) ×2 IMPLANT
DRSG MEPILEX BORDER 4X4 (GAUZE/BANDAGES/DRESSINGS) ×2 IMPLANT
DRSG MEPILEX BORDER 4X8 (GAUZE/BANDAGES/DRESSINGS) ×2 IMPLANT
DURAPREP 26ML APPLICATOR (WOUND CARE) ×2 IMPLANT
ELECT CAUTERY BLADE 6.4 (BLADE) ×2 IMPLANT
ELECT REM PT RETURN 9FT ADLT (ELECTROSURGICAL) ×2
ELECTRODE REM PT RTRN 9FT ADLT (ELECTROSURGICAL) ×1 IMPLANT
EVACUATOR 1/8 PVC DRAIN (DRAIN) IMPLANT
GAUZE XEROFORM 5X9 LF (GAUZE/BANDAGES/DRESSINGS) ×2 IMPLANT
GLOVE BIOGEL PI IND STRL 8 (GLOVE) ×2 IMPLANT
GLOVE BIOGEL PI INDICATOR 8 (GLOVE) ×2
GLOVE ECLIPSE 7.5 STRL STRAW (GLOVE) ×4 IMPLANT
GOWN PREVENTION PLUS XLARGE (GOWN DISPOSABLE) ×2 IMPLANT
GOWN SRG XL XLNG 56XLVL 4 (GOWN DISPOSABLE) ×1 IMPLANT
GOWN STRL NON-REIN LRG LVL3 (GOWN DISPOSABLE) ×2 IMPLANT
GOWN STRL NON-REIN XL XLG LVL4 (GOWN DISPOSABLE) ×1
KIT BASIN OR (CUSTOM PROCEDURE TRAY) ×2 IMPLANT
KIT ROOM TURNOVER OR (KITS) ×2 IMPLANT
MANIFOLD NEPTUNE II (INSTRUMENTS) ×2 IMPLANT
NS IRRIG 1000ML POUR BTL (IV SOLUTION) ×2 IMPLANT
PACK GENERAL/GYN (CUSTOM PROCEDURE TRAY) ×2 IMPLANT
PAD ARMBOARD 7.5X6 YLW CONV (MISCELLANEOUS) ×4 IMPLANT
STAPLER VISISTAT 35W (STAPLE) ×2 IMPLANT
SUT VIC AB 0 CTB1 27 (SUTURE) ×4 IMPLANT
SUT VIC AB 1 CTB1 27 (SUTURE) ×2 IMPLANT
SUT VIC AB 2-0 CTB1 (SUTURE) ×2 IMPLANT
TOWEL OR 17X24 6PK STRL BLUE (TOWEL DISPOSABLE) ×2 IMPLANT
TOWEL OR 17X26 10 PK STRL BLUE (TOWEL DISPOSABLE) ×2 IMPLANT
WATER STERILE IRR 1000ML POUR (IV SOLUTION) ×2 IMPLANT

## 2013-01-31 NOTE — Progress Notes (Signed)
[facility] PROGRESS NOTE  Andrea Koch GNF:[id number] DOB: 77-29-1933 DOA: 01/28/2013 PCP: Provider Not In System  Assessment/Plan: Femur fracture, left and right with persistent extreme pain with even minimal movement. -Appreciate orthopedic followup  -Possible surgery today per Dr. Luiz Blare  -Pain control, increased IV morphine and continue oral Vicodin -DVT prophylaxis   E. coli UTI  -Continue ceftriaxone  - Discontinue Foley as soon as possible  Paroxysmal atrial fibrillation developed RVR am 01/30/13--HR 140>>120s.  Heart rate has trended down to the 80s with increased digoxin.  Patient is stable for OR -Continue digoxin  - Repeat digoxin level in a few days. -TSH 4.2 - Worsening anemia on labs yesterday morning likely due to hematoma from bilateral fractures  -Not on anticoagulation   Hypothyroidism continue synthroid.  Repeat TSH in 4-6 week Dementia, Stable  Microcytic anemia, ferritin normal, but low iron and iron saturations. Transferrin was not sent.  Occult stool has not been sent yet -  Will start oral iron once tolerating a diet postoperatively  Diet: N.p.o.  Family Communication: Left message at [phone] Zeb Comfort, patient's son.  I asked him to call back for updates.  Disposition Plan: SNF If not a candidate for surgery  Procedures/Studies: Dg Chest 1 View  01/28/2013   *RADIOLOGY REPORT*  Clinical Data: Bilateral thigh pain status post fall today.  CHEST - 1 VIEW  Comparison: 11/27/2011 and 06/17/2010.  Findings: Right subclavian pacemaker leads appear unchanged within the right atrium and ventricle.  The heart size and mediastinal contours are stable.  There is a questionable new nodular density in the left suprahilar region adjacent to the aortic arch.  This could be related to the first costochondral junction and different positioning.  The lungs are otherwise clear.  There is no pleural effusion or pneumothorax.  Osteophytes are noted  throughout the thoracic spine.  IMPRESSION:  1.  No acute findings identified. 2.  Cannot exclude a developing nodule in the left suprahilar region.  This could be due to the costochondral junction and positional differences.  Attention to this area on follow-up recommended.   Original Report Authenticated By: Carey Bullocks, M.D.   Dg Pelvis 1-2 Views  01/28/2013   *RADIOLOGY REPORT*  Clinical Data: Fall and hip injury  PELVIS - 1-2 VIEW  Comparison: 05/24/2009  Findings: Thoracic spine incompletely imaged, with suggestion of central possible endplate compression deformities, not further evaluated on this exam, unchanged.  Moderate stool volume projecting over descending and sigmoid colon.  No displaced pelvic fracture identified.  IMPRESSION: No displaced pelvic fracture.  Stable appearance to the inferior lumbar spine with suggestion of central endplate compression deformities again noted.   Original Report Authenticated By: Christiana Pellant, M.D.   Dg Femur Left  01/28/2013   *RADIOLOGY REPORT*  Clinical Data: Fall, leg pain  LEFT FEMUR - 2 VIEW  Comparison: Pelvic radiograph same date, prior exam 05/24/2009  Findings: Mild left hip degenerative change is identified. Bones are osteopenic, which may mask subtle fracture.  Moderate stool volume noted over the sigmoid and descending colon.  Lumbar spine central compression deformities re-identified.  Vascular calcifications noted. There is a comminuted oblique distal left femoral fracture with approximately one half shaft width overlap of the fracture fragments.  Tricompartmental degenerative changes are noted in the knee.  Mild varus angulation at the fracture site. Prepatellar soft tissue swelling is identified.  IMPRESSION: A comminuted oblique distal left femoral fracture without apparent intra-articular extension.   Original Report Authenticated By: Christiana Pellant, M.D.  Dg Femur Right  01/28/2013   *RADIOLOGY REPORT*  Clinical Data: Fall today with  bilateral femur pain.  RIGHT FEMUR - 2 VIEW  Comparison: None.  Findings: Mild to moderate osteopenia.  Extensive artifact , presumably due to clothing.  A markedly comminuted and angulated fracture of the distal femur, suboptimally evaluated. Intra- articular extension.  The more proximal and mid femoral shaft demonstrates no fracture.  Suboptimal evaluation of the proximal tibia and fibula.  IMPRESSION: Moderately degraded secondary to artifact distally.  Comminuted intra-articular distal femur fracture.  Recommend dedicated knee films.  The more proximal femur demonstrates no acute finding.   Original Report Authenticated By: Jeronimo Greaves, M.D.   Dg Tibia/fibula Left  01/28/2013   *RADIOLOGY REPORT*  Clinical Data: Fall today.  Lower leg bruising.  LEFT TIBIA AND FIBULA - 2 VIEW  Comparison: None.  Findings: The knees are excluded from this examination and reported separately.  The bones are diffusely demineralized.  There is no evidence of acute fracture or dislocation in the lower leg. Vascular calcifications are noted.  There is joint space loss within the hind foot.  Scattered vascular calcifications are noted.  IMPRESSION: No evidence of left lower leg acute fracture or dislocation. Distal femoral/knee findings are dictated separately.   Original Report Authenticated By: Carey Bullocks, M.D.   Dg Tibia/fibula Right  01/28/2013   *RADIOLOGY REPORT*  Clinical Data: Fall today.  Lower extremity bruising.  RIGHT TIBIA AND FIBULA - 2 VIEW  Comparison: None.  Findings: The bones appears severely demineralized.  No acute fracture or dislocation of the mid to distal lower legs is demonstrated.  The knees are excluded from these images, and discussed on the separate examination of the right femur. Scattered vascular calcifications are noted.  The positioning is suboptimal for evaluation of the ankle.  Joint space loss is noted throughout the hind foot.  IMPRESSION: No acute findings are identified within the lower  leg.  Distal femoral/knee findings are reported separately.   Original Report Authenticated By: Carey Bullocks, M.D.   Ct Head Wo Contrast  01/28/2013   *RADIOLOGY REPORT*  Clinical Data:  Status post fall.  Hip injury.  Altered mental status.  History of dementia.  CT HEAD WITHOUT CONTRAST CT CERVICAL SPINE WITHOUT CONTRAST  Technique:  Multidetector CT imaging of the head and cervical spine was performed following the standard protocol without intravenous contrast.  Multiplanar CT image reconstructions of the cervical spine were also generated.  Comparison:  Head CT 11/27/2011.  Cervical spine CT 08/05/2011.  CT HEAD  Findings: There is progressive atrophy with diffuse prominence of the ventricles and subarachnoid spaces.  Confluent low density in the periventricular white matter also appears mildly progressive. However, there is no evidence of acute intracranial hemorrhage, mass lesion, brain edema or extra-axial fluid collection.  There is no evidence of acute infarct.  The visualized paranasal sinuses are clear.  The calvarium is intact.  IMPRESSION:  1.  Progressive atrophy and chronic small vessel ischemic changes. 2.  No acute intracranial findings identified.  CT CERVICAL SPINE  Findings: The cervical alignment appears similar.  There is a retrolisthesis at C3-C4 associated with occult spinal dysraphism at C3. This results in chronic central and biforaminal stenosis. There is a mild anterolisthesis at C5-C6.  There is multilevel spondylosis.  No acute fracture or traumatic subluxation is identified.  No acute soft tissue findings are evident.  Carotid arterial calcifications are noted bilaterally.  IMPRESSION: No evidence of acute cervical spine fracture, traumatic  subluxation or static signs of instability.  Stable alignment and multilevel spondylosis contributing to spinal stenosis at C3-C4.   Original Report Authenticated By: Carey Bullocks, M.D.   Ct Cervical Spine Wo Contrast  01/28/2013    *RADIOLOGY REPORT*  Clinical Data:  Status post fall.  Hip injury.  Altered mental status.  History of dementia.  CT HEAD WITHOUT CONTRAST CT CERVICAL SPINE WITHOUT CONTRAST  Technique:  Multidetector CT imaging of the head and cervical spine was performed following the standard protocol without intravenous contrast.  Multiplanar CT image reconstructions of the cervical spine were also generated.  Comparison:  Head CT 11/27/2011.  Cervical spine CT 08/05/2011.  CT HEAD  Findings: There is progressive atrophy with diffuse prominence of the ventricles and subarachnoid spaces.  Confluent low density in the periventricular white matter also appears mildly progressive. However, there is no evidence of acute intracranial hemorrhage, mass lesion, brain edema or extra-axial fluid collection.  There is no evidence of acute infarct.  The visualized paranasal sinuses are clear.  The calvarium is intact.  IMPRESSION:  1.  Progressive atrophy and chronic small vessel ischemic changes. 2.  No acute intracranial findings identified.  CT CERVICAL SPINE  Findings: The cervical alignment appears similar.  There is a retrolisthesis at C3-C4 associated with occult spinal dysraphism at C3. This results in chronic central and biforaminal stenosis. There is a mild anterolisthesis at C5-C6.  There is multilevel spondylosis.  No acute fracture or traumatic subluxation is identified.  No acute soft tissue findings are evident.  Carotid arterial calcifications are noted bilaterally.  IMPRESSION: No evidence of acute cervical spine fracture, traumatic subluxation or static signs of instability.  Stable alignment and multilevel spondylosis contributing to spinal stenosis at C3-C4.   Original Report Authenticated By: Carey Bullocks, M.D.         Subjective: The patient complains of pain in the lower extremities.  With minimal movement of her right leg she cries out in pain. She denies difficulty breathing, chest pain.  The flow sheet  she ate very little yesterday. She is n.p.o. currently in preparation for possible surgery. Foley catheter in place  Objective: Filed Vitals:   01/31/13 0513 01/31/13 0657 01/31/13 0729 01/31/13 0920  BP:    116/60  Pulse:    89  Temp: 99.5 F (37.5 C) 96.7 F (35.9 C)  97.8 F (36.6 C)  TempSrc: Axillary Axillary  Oral  Resp: 22  20 20   Height:      Weight:      SpO2:   95% 96%    Intake/Output Summary (Last 24 hours) at 01/31/13 0948 Last data filed at 01/31/13 0900  Gross per 24 hour  Intake    955 ml  Output    485 ml  Net    470 ml   Weight change: 0.1 kg (3.5 oz) Exam:   General:  Pt is alert, no acute distress  HEENT: No icterus, No thrush, [place]/AT, with mucous membranes, edentulous  Cardiovascular: IRRR rate in the 70s to 80s,no rubs, no gallops  Respiratory: Cough at the bilateral bases that clear somewhat with repeat respirations, no wheezing, no rhonchi, no increased work of breathing  Abdomen: Soft/+BS, non tender, non distended, no guarding  Extremities: Trace bilateral lower extremity edema, No lymphangitis, No petechiae.  Left knee with significant ecchymoses and very tender to palpation. Pain with palpation of the right thigh. Patient spontaneously moves her ankles and her toes. 2+ pulses bilaterally, sensation appears to be intact bilaterally  Data Reviewed: Basic Metabolic Panel:  Recent Labs Lab 01/28/13 1744 01/29/13 0730 01/30/13 0540 01/31/13 0542  NA 132* 135 138 143  K 4.8 4.5 4.1 4.2  CL 96 100 104 113*  CO2 24 24 22 24   GLUCOSE 161* 117* 114* 110*  BUN 27* 29* 36* 38*  CREATININE 0.65 0.52 0.51 0.50  CALCIUM 9.2 8.4 8.6 8.5  MG  --   --  2.2  --    Liver Function Tests:  Recent Labs Lab 01/28/13 1744  AST 27  ALT 22  ALKPHOS 124*  BILITOT 0.5  PROT 6.9  ALBUMIN 3.1*   No results found for this basename: LIPASE, AMYLASE,  in the last 168 hours No results found for this basename: AMMONIA,  in the last 168  hours CBC:  Recent Labs Lab 01/28/13 1744 01/30/13 0835  WBC 6.3 9.8  NEUTROABS 4.0  --   HGB 11.5* 8.2*  HCT 34.2* 24.7*  MCV 80.3 82.1  PLT 310 288   Cardiac Enzymes: No results found for this basename: CKTOTAL, CKMB, CKMBINDEX, TROPONINI,  in the last 168 hours BNP: No components found with this basename: POCBNP,  CBG: No results found for this basename: GLUCAP,  in the last 168 hours  Recent Results (from the past 240 hour(s))  SURGICAL PCR SCREEN     Status: Abnormal   Collection Time    01/28/13  8:32 PM      Result Value Range Status   MRSA, PCR NEGATIVE  NEGATIVE Final   Staphylococcus aureus POSITIVE (*) NEGATIVE Final   Comment:            The Xpert SA Assay (FDA     approved for NASAL specimens     in patients over 56 years of age),     is one component of     a comprehensive surveillance     program.  Test performance has     been validated by [employer] for patients greater     than or equal to 49 year old.     It is not intended     to diagnose infection nor to     guide or monitor treatment.     RESULT CALLED TO, READ BACK BY AND VERIFIED WITH:     A.MOORE,RN [id number] 01/28/13 M.CAMPBELL  URINE CULTURE     Status: None   Collection Time    01/28/13  9:42 PM      Result Value Range Status   Specimen Description URINE, CATHETERIZED   Final   Special Requests NONE   Final   Culture  Setup Time 01/29/2013 02:37   Final   Colony Count >=100,000 COLONIES/ML   Final   Culture ESCHERICHIA COLI   Final   Report Status 01/30/2013 FINAL   Final   Organism ID, Bacteria ESCHERICHIA COLI   Final     Scheduled Meds: . cefTRIAXone (ROCEPHIN)  IV  1 g Intravenous Q24H  . Chlorhexidine Gluconate Cloth  6 each Topical Daily  . digoxin  0.0625 mg Oral Daily  . diltiazem  30 mg Oral Q6H  . heparin  5,000 Units Subcutaneous Q8H  . levothyroxine  25 mcg Oral QAC breakfast  . mupirocin ointment  1 application Nasal BID   Continuous Infusions:     Renae Fickle, MD  [facility] Pager [phone]  If 7PM-7AM, please contact night-coverage [email or web address] Password [email or web address] 01/31/2013, 9:48 AM   LOS: 3 days

## 2013-01-31 NOTE — Progress Notes (Signed)
Patient is being transported to the OR, CMT notified and telemetry has been removed.  Lorretta Harp RN

## 2013-01-31 NOTE — Progress Notes (Signed)
Pt temperature 99.5 axillary. BP 139/81, HR 87. RR 22. Pt 93% on RA. MD notified via [facility] text page. Pagex1 Baron Hamper, RN 01/31/2013

## 2013-01-31 NOTE — Preoperative (Signed)
Beta Blockers   Reason not to administer Beta Blockers:Not Applicable 

## 2013-01-31 NOTE — Pre-Procedure Instructions (Addendum)
THIS IS A PROGRESS NOTE FOR THE CHART BUT UNABLE TO GET THE NOTE L;ABLED AS PROGRESS Subjective: Pt screams and appears to be in severe pain all times currently.  Not able to understand her.   Objective: Vital signs in last 24 hours: Temp:  [96.7 F (35.9 C)-99.5 F (37.5 C)] 97.1 F (36.2 C) (06/04 1019) Pulse Rate:  [71-150] 71 (06/04 1019) Resp:  [20-22] 20 (06/04 1019) BP: (69-139)/(44-81) 99/44 mmHg (06/04 1019) SpO2:  [93 %-100 %] 93 % (06/04 1019) Weight:  [63.6 kg (140 lb 3.4 oz)] 63.6 kg (140 lb 3.4 oz) (06/04 0445)  Intake/Output from previous day: 06/03 0701 - 06/04 0700 In: 1015 [P.O.:170; I.V.:795; IV Piggyback:50] Out: 485 [Urine:485] Intake/Output this shift: Total I/O In: 1063.3 [I.V.:1063.3] Out: 200 [Urine:200]   Recent Labs  01/28/13 1744 01/30/13 0835  HGB 11.5* 8.2*    Recent Labs  01/28/13 1744 01/30/13 0835  WBC 6.3 9.8  RBC 4.26 3.01*  HCT 34.2* 24.7*  PLT 310 288    Recent Labs  01/30/13 0540 01/31/13 0542  NA 138 143  K 4.1 4.2  CL 104 113*  CO2 22 24  BUN 36* 38*  CREATININE 0.51 0.50  GLUCOSE 114* 110*  CALCIUM 8.6 8.5   No results found for this basename: LABPT, INR,  in the last 72 hours  ABD soft Intact pulses distally  Assessment/Plan: Bilateral distal femur fractures with severe pain with non-op rx.// Had long discussion with her son and we agree that even though thee is significant chance of peri-op complications and death that we should try to help relieve her pain with at least fixing the l. Side and looking at the right side under fluoro and deciding if we think we can help her there.  He understands the very real risk of a bad outcome or death given her extremely frail situation.  Will need blood during and perha[ps after the procedure as well.  Brooks Kinnan L 01/31/2013, 12:14 PM

## 2013-01-31 NOTE — OR Nursing (Signed)
Arrived pacu with brusing to chin and r cheek

## 2013-01-31 NOTE — Anesthesia Postprocedure Evaluation (Signed)
  Anesthesia Post-op Note  Patient: Andrea Koch  Procedure(s) Performed: Procedure(s): INTRAMEDULLARY (IM) RETROGRADE FEMORAL NAILING (Bilateral)  Patient Location: PACU  Anesthesia Type:General  Level of Consciousness: awake  Airway and Oxygen Therapy: Patient Spontanous Breathing and Patient connected to nasal cannula oxygen  Post-op Pain: mild  Post-op Assessment: Post-op Vital signs reviewed, Patient's Cardiovascular Status Stable and Respiratory Function Stable  Post-op Vital Signs: Reviewed and stable  Complications: No apparent anesthesia complications

## 2013-01-31 NOTE — Progress Notes (Signed)
CSW spoke with patient's son Leonette Most today. He confirmed that he wants his mother to return to [facility] at d/c and that he has spoke to the facility. They have assured him that they will have a bed for patient when she is medically stable. Fl2 on chart for MD's signature. She underwent surgery today. CSW will monitor.  Lorri Frederick. [place]  [phone]  '

## 2013-01-31 NOTE — Clinical Documentation Improvement (Signed)
  DOCUMENTATION CLARIFICATIONS ARE NOT PART OF THE PERMANENT MEDICAL RECORD    Please update your documentation within the medical record to reflect your response to this query.                                                                                      02/01/2013  Dr. Butler Denmark,   In a better effort to capture your patient's severity of illness, reflect appropriate length of stay and utilization of resources, a review of the patient medical record has revealed the following information:  The information below is documented by Nursing in the Doc Flowsheet Section of [facility]:  "Pressure Ulcer Stage II  Location Sacrum   Partial thickness loss of dermis, shallow open ulcer with a red pink wound bed Present on Admission."   Based on your clinical judgment, please document in the progress notes and discharge summary if you agree with:   - Stage II Sacral Pressure Ulcer, Present on Admission   - Other Condition   - Unable to Clinically Determine   Medicare Guidelines require that the documentation of pressure ulcers must be completed by an attending provider and include whether or not present on admission.   Reviewed: 02/05/13 - query never addressed - ndrgi, no soi/rom impact.  Dc'ed pending 02/05/13.  Thank You,  Jerral Ralph  RN BSN CCDS Certified Clinical Documentation Specialist: Cell   [phone]  Health Information Management [place]    TO RESPOND TO THE THIS QUERY, FOLLOW THE INSTRUCTIONS BELOW:  1. If needed, update documentation for the patient's encounter via the notes activity.  2. Access this query again and click edit on the In [employer].  3. After updating, or not, click F2 to complete all highlighted (required) fields concerning your review. Select "additional documentation in the medical record" OR "no additional documentation provided".  4. Click Sign note button.  5. The deficiency will fall out of your In Basket *Please let us know  if you are not able to complete this workflow by phone or e-mail (listed below).

## 2013-01-31 NOTE — Anesthesia Preprocedure Evaluation (Addendum)
Anesthesia Evaluation  Patient identified by MRN, date of birth, ID band Patient confused    Reviewed: Allergy & Precautions, H&P , NPO status , Patient's Chart, lab work & pertinent test results  History of Anesthesia Complications Negative for: history of anesthetic complications  Airway Mallampati: II TM Distance: >3 FB Neck ROM: Limited  Mouth opening: Limited Mouth Opening  Dental  (+) Edentulous Upper, Edentulous Lower and Dental Advisory Given   Pulmonary          Cardiovascular hypertension, + CAD and +CHF + dysrhythmias + pacemaker     Neuro/Psych PSYCHIATRIC DISORDERS Dementia    GI/Hepatic   Endo/Other  Hypothyroidism   Renal/GU      Musculoskeletal   Abdominal   Peds  Hematology   Anesthesia Other Findings   Reproductive/Obstetrics                           Anesthesia Physical Anesthesia Plan  ASA: III  Anesthesia Plan: General   Post-op Pain Management:    Induction: Intravenous  Airway Management Planned: Oral ETT  Additional Equipment:   Intra-op Plan:   Post-operative Plan: Extubation in OR  Informed Consent: I have reviewed the patients History and Physical, chart, labs and discussed the procedure including the risks, benefits and alternatives for the proposed anesthesia with the patient or authorized representative who has indicated his/her understanding and acceptance.     Plan Discussed with: CRNA and Surgeon  Anesthesia Plan Comments:        Anesthesia Quick Evaluation

## 2013-01-31 NOTE — OR Nursing (Signed)
Assessed by dr fitzgerald/ signed out

## 2013-01-31 NOTE — Clinical Social Work Psychosocial (Addendum)
    Clinical Social Work Department BRIEF PSYCHOSOCIAL ASSESSMENT 02/05/2013  Patient:  Andrea Koch, Andrea Koch     Account Number:  [id number]     Admit date:  01/28/2013  Clinical Social Worker:  Tiburcio Pea  Date/Time:  01/29/2013 02:30 PM  Referred by:  Physician  Date Referred:  01/29/2013 Referred for  SNF Placement   Other Referral:   (return)   Interview type:  Other - See comment Other interview type:   Patient open her eyes but did not respond to CSW. Message left for patient's son Andrea Koch to return call.    PSYCHOSOCIAL DATA Living Status:  FACILITY Admitted from facility:  [facility] Level of care:  Skilled Nursing Facility Primary support name:  Andrea Koch [phone] (H) Primary support relationship to patient:  CHILD, ADULT Degree of support available:   Strong support  Andrea Koch work number [phone] *    CURRENT CONCERNS Current Concerns  Post-Acute Placement   Other Concerns:    SOCIAL WORK ASSESSMENT / PLAN 79 year old female- resident of [facility] presented to hospital with bilateral femur fractures.  Patient will open her eyes but does not respond to CSW. Attempting to reach patient's son Andrea Koch to discuss.  CSW contacted Admissions Director at [facility]- she stated that patient's family did not hold the bed at the facility but they will accept patient back when medically.   Assessment/plan status:  Psychosocial Support/Ongoing Assessment of Needs Other assessment/ plan:   Information/referral to community resources:   None at this time    PATIENT'S/FAMILY'S RESPONSE TO PLAN OF CARE: Patient is alert but confused; unable to participate or respond to plan of care.  CSW will follow up with patient's son Andrea Koch. Anticipate return to SNF at [facility] unless son does not wish return to facility.  CSW will monitor and determine if further placement disposition is indicated.

## 2013-01-31 NOTE — Progress Notes (Signed)
Orthopedic Tech Progress Note Patient Details:  Andrea Koch 10-27-1931 [id number] Patient unable to use. Patient ID: Andrea Koch, female   DOB: 1932-03-09, 79 y.o.   MRN: [id number]   [name] 01/31/2013, 5:52 PM

## 2013-01-31 NOTE — Anesthesia Procedure Notes (Signed)
Procedure Name: Intubation Date/Time: 01/31/2013 12:23 PM Performed by: Armandina Gemma Pre-anesthesia Checklist: Patient identified, Timeout performed, Emergency Drugs available, Suction available and Patient being monitored Patient Re-evaluated:Patient Re-evaluated prior to inductionOxygen Delivery Method: Circle system utilized Preoxygenation: Pre-oxygenation with 100% oxygen Intubation Type: IV induction Ventilation: Mask ventilation without difficulty Laryngoscope Size: 2 and Miller Grade View: Grade I Tube type: Oral Tube size: 7.0 mm Number of attempts: 1 Airway Equipment and Method: Stylet Placement Confirmation: ETT inserted through vocal cords under direct vision,  breath sounds checked- equal and bilateral and positive ETCO2 Secured at: 22 cm Tube secured with: Tape Dental Injury: Teeth and Oropharynx as per pre-operative assessment  Comments: IV induction Ossey- Intubation AM CRNA atraumatic

## 2013-01-31 NOTE — Progress Notes (Signed)
Pt urine output 185cc all night. Bladder scan shows 62cc in bladder. Pt has foley. Pt on 163ml/hr NS fluids. MD notified via [facility] text page. Will continue to monitor pt. Baron Hamper, RN 01/31/2013 4:50 AM

## 2013-01-31 NOTE — Brief Op Note (Signed)
01/28/2013 - 01/31/2013  3:01 PM  PATIENT:  Andrea Koch  79 y.o. female  PRE-OPERATIVE DIAGNOSIS:  bilateral femur fractures  POST-OPERATIVE DIAGNOSIS:  bilateral femur fractures  PROCEDURE:  Procedure(s): INTRAMEDULLARY (IM) RETROGRADE FEMORAL NAILING (Bilateral)  SURGEON:  Surgeon(s) and Role:    * Harvie Junior, MD - Primary  PHYSICIAN ASSISTANT:   ASSISTANTS: bethune   ANESTHESIA:   general  EBL:  Total I/O In: 2163.3 [I.V.:1463.3; Blood:700] Out: 400 [Urine:300; Blood:100]  BLOOD ADMINISTERED:2 CC PRBC  DRAINS: none   LOCAL MEDICATIONS USED:  NONE  SPECIMEN:  No Specimen  DISPOSITION OF SPECIMEN:  N/A  COUNTS:  YES  TOURNIQUET:   Total Tourniquet Time Documented: Thigh (Left) - 32 minutes Total: Thigh (Left) - 32 minutes  Thigh (Right) - 34 minutes Total: Thigh (Right) - 34 minutes   DICTATION: .Other Dictation: Dictation Number [id number]  PLAN OF CARE: Admit to inpatient   PATIENT DISPOSITION:  PACU - hemodynamically stable.   Delay start of Pharmacological VTE agent (>24hrs) due to surgical blood loss or risk of bleeding: no

## 2013-01-31 NOTE — Progress Notes (Signed)
Patient will not be coming back to 4700 per OR.  Lorretta Harp RN

## 2013-01-31 NOTE — Plan of Care (Signed)
Problem: Phase I Progression Outcomes Goal: Pre op pain controlled with appropriate interventions Outcome: Progressing Pt given 1mg  IV morphine and 1 Norco this PM. Pt complains of pain with movement.

## 2013-01-31 NOTE — Transfer of Care (Signed)
Immediate Anesthesia Transfer of Care Note  Patient: Andrea Koch  Procedure(s) Performed: Procedure(s): INTRAMEDULLARY (IM) RETROGRADE FEMORAL NAILING (Bilateral)  Patient Location: PACU  Anesthesia Type:General  Level of Consciousness: sedated  Airway & Oxygen Therapy: Patient Spontanous Breathing and Patient connected to nasal cannula oxygen  Post-op Assessment: Report given to PACU RN and Post -op Vital signs reviewed and stable  Post vital signs: Reviewed and stable  Complications: No apparent anesthesia complications

## 2013-02-01 ENCOUNTER — Inpatient Hospital Stay (HOSPITAL_COMMUNITY): Payer: Medicare Other

## 2013-02-01 LAB — TYPE AND SCREEN
ABO/RH(D): O POS
Antibody Screen: NEGATIVE
Unit division: 0

## 2013-02-01 LAB — CBC
HCT: 28.6 % — ABNORMAL LOW (ref 36.0–46.0)
MCH: 27.2 pg (ref 26.0–34.0)
MCV: 84.5 fL (ref 78.0–100.0)
MCV: 85.4 fL (ref 78.0–100.0)
Platelets: 248 10*3/uL (ref 150–400)
Platelets: 257 10*3/uL (ref 150–400)
RBC: 3.35 MIL/uL — ABNORMAL LOW (ref 3.87–5.11)
RDW: 14.7 % (ref 11.5–15.5)
WBC: 11.2 10*3/uL — ABNORMAL HIGH (ref 4.0–10.5)

## 2013-02-01 LAB — BASIC METABOLIC PANEL
BUN: 35 mg/dL — ABNORMAL HIGH (ref 6–23)
Calcium: 8 mg/dL — ABNORMAL LOW (ref 8.4–10.5)
Creatinine, Ser: 0.43 mg/dL — ABNORMAL LOW (ref 0.50–1.10)
GFR calc Af Amer: >90 mL/min (ref >90)

## 2013-02-01 LAB — DIGOXIN LEVEL: Digoxin Level: 0.8 ng/mL (ref 0.8–2.0)

## 2013-02-01 MED ORDER — SODIUM CHLORIDE 0.9 % IV BOLUS (SEPSIS)
250.0000 mL | Freq: Once | INTRAVENOUS | Status: AC
  Administered 2013-02-01: 250 mL via INTRAVENOUS

## 2013-02-01 MED ORDER — BIOTENE DRY MOUTH MT LIQD
15.0000 mL | Freq: Two times a day (BID) | OROMUCOSAL | Status: DC
  Administered 2013-02-01 – 2013-02-04 (×7): 15 mL via OROMUCOSAL

## 2013-02-01 MED ORDER — DILTIAZEM HCL 100 MG IV SOLR
5.0000 mg/h | INTRAVENOUS | Status: DC
  Administered 2013-02-01: 5 mg/h via INTRAVENOUS
  Administered 2013-02-02: 10 mg/h via INTRAVENOUS
  Filled 2013-02-01 (×2): qty 100

## 2013-02-01 MED ORDER — DIGOXIN 0.25 MG/ML IJ SOLN
0.1250 mg | Freq: Every day | INTRAMUSCULAR | Status: DC
  Administered 2013-02-01 – 2013-02-05 (×5): 0.125 mg via INTRAVENOUS
  Filled 2013-02-01 (×8): qty 0.5

## 2013-02-01 MED ORDER — ENOXAPARIN SODIUM 40 MG/0.4ML ~~LOC~~ SOLN
40.0000 mg | SUBCUTANEOUS | Status: DC
  Administered 2013-02-01 – 2013-02-04 (×4): 40 mg via SUBCUTANEOUS
  Filled 2013-02-01 (×5): qty 0.4

## 2013-02-01 MED ORDER — CHLORHEXIDINE GLUCONATE 0.12 % MT SOLN
15.0000 mL | Freq: Two times a day (BID) | OROMUCOSAL | Status: DC
  Administered 2013-02-01 – 2013-02-05 (×9): 15 mL via OROMUCOSAL
  Filled 2013-02-01 (×10): qty 15

## 2013-02-01 NOTE — Progress Notes (Signed)
Patient underwent surgery yesterday and is now on unit 3300. Written report given to Theresia Bough, LCSWA and verbal report message left on her v-mail.  Plan return to [facility] when medically stable.  Lorri Frederick. [place]  [phone]

## 2013-02-01 NOTE — Progress Notes (Signed)
Verbal order received to keep foley in place for now;  Will continue to monitor.  Vivi Martens RN

## 2013-02-01 NOTE — Progress Notes (Signed)
Pt's HR 120's and BP systolic in the 80's; morning dose of Cardizem held by nightshift RN due to bp being low; MD made aware and orders received; will continue to monitor.  Vivi Martens RN

## 2013-02-01 NOTE — Progress Notes (Signed)
Patient's bp low and in afib. Andrea Poag, NP aware and new orders received. Will continue to monitor.

## 2013-02-01 NOTE — Progress Notes (Addendum)
[facility] Progress Note [place] TEAM 1 - Stepdown/ICU TEAM   Andrea Koch:[id number] DOB: July 19, 1932 DOA: 01/28/2013 PCP: Provider Not In System  Brief narrative: Patient is a 79 year old female with severe advanced dementia, hypertension, coronary artery disease, paroxysmal atrial fibrillation on digoxin, history of tachybradycardia syndrome was transferred from skilled nursing facility after a fall and fracture of her left femur. She was also noted to be in A-fib.  She has a h/o of a right femoral fracture- She underwent ORIF of bilateral femoral fractures on 6/4.    Assessment/Plan: Femur fracture, left and right  -surgery performed due to suspicion of persistent extreme pain (pt screaming constantly) however, after surgery, she continues to have the same behavior -Appreciate orthopedic followup  -Pain control with IV morphine and continue oral Vicodin as tolerated- pt not taking POs today due to confusion  E. coli UTI  -Continue ceftriaxone  - Discontinue Foley as soon as possible   Paroxysmal atrial fibrillation  developed RVR am 01/30/13--HR 140>>120s.  Heart rate trended down to the 80s with increased digoxin.  HR now in 100s again possibly from fluid/blood loss and uncontrolled pain- fluid boluses given Due to hypotension, we are avoiding Cardizem and Lopressor boluses and will keep her on a low dose continuous infusion - if Dig level low this evening, will give a second dose of IV Dig  Poor urine output - giving fluid boluses today- total of 750 cc already- - have increased IVF to 150 cc/hr -recheck Hgb to ensure she does not need a blood transfusion  Hypothyroidism  -continue synthroid. Repeat TSH in 4-6 week   Dementia - Stable   Microcytic anemia  ferritin normal, but low iron and iron saturations. Transferrin was not sent. Occult stool has not been sent yet  - Will start oral iron once tolerating a diet       Code Status: DNR Family  Communication: none Disposition Plan: follow in SDU until HR better controlled   Consultants: Ortho  Procedures: ORIF- b/l femurs  Antibiotics: Rocephin 6/2 Ancef- peri-op  DVT prophylaxis: SCDs  HPI/Subjective: Pt confused- often moaning and agitated   Objective: Blood pressure 86/55, pulse 89, temperature 99.1 F (37.3 C), temperature source Oral, resp. rate 20, height 5\' 4"  (1.626 m), weight 63.6 kg (140 lb 3.4 oz), SpO2 100.00%.  Intake/Output Summary (Last 24 hours) at 02/01/13 1540 Last data filed at 02/01/13 1100  Gross per 24 hour  Intake 1638.3 ml  Output    500 ml  Net 1138.3 ml     Exam: General: No acute respiratory distress Lungs: Clear to auscultation bilaterally without wheezes or crackles Cardiovascular: Iregular rate and rhythm without murmur gallop or rub normal S1 and S2 Abdomen: Nontender, nondistended, soft, bowel sounds positive, no rebound, no ascites, no appreciable mass Extremities: No significant cyanosis, clubbing- b/l knees in brace edema bilateral lower extremities  Data Reviewed: Basic Metabolic Panel:  Recent Labs Lab 01/28/13 1744 01/29/13 0730 01/30/13 0540 01/31/13 0542 02/01/13 0440  NA 132* 135 138 143 145  K 4.8 4.5 4.1 4.2 4.2  CL 96 100 104 113* 113*  CO2 24 24 22 24 24   GLUCOSE 161* 117* 114* 110* 112*  BUN 27* 29* 36* 38* 35*  CREATININE 0.65 0.52 0.51 0.50 0.43*  CALCIUM 9.2 8.4 8.6 8.5 8.0*  MG  --   --  2.2  --   --    Liver Function Tests:  Recent Labs Lab 01/28/13 1744  AST 27  ALT 22  ALKPHOS 124*  BILITOT 0.5  PROT 6.9  ALBUMIN 3.1*   No results found for this basename: LIPASE, AMYLASE,  in the last 168 hours No results found for this basename: AMMONIA,  in the last 168 hours CBC:  Recent Labs Lab 01/28/13 1744 01/30/13 0835 02/01/13 0440  WBC 6.3 9.8 9.6  NEUTROABS 4.0  --   --   HGB 11.5* 8.2* 9.3*  HCT 34.2* 24.7* 28.9*  MCV 80.3 82.1 84.5  PLT 310 288 248   Cardiac Enzymes: No  results found for this basename: CKTOTAL, CKMB, CKMBINDEX, TROPONINI,  in the last 168 hours BNP (last 3 results) No results found for this basename: PROBNP,  in the last 8760 hours CBG: No results found for this basename: GLUCAP,  in the last 168 hours  Recent Results (from the past 240 hour(s))  SURGICAL PCR SCREEN     Status: Abnormal   Collection Time    01/28/13  8:32 PM      Result Value Range Status   MRSA, PCR NEGATIVE  NEGATIVE Final   Staphylococcus aureus POSITIVE (*) NEGATIVE Final   Comment:            The Xpert SA Assay (FDA     approved for NASAL specimens     in patients over 90 years of age),     is one component of     a comprehensive surveillance     program.  Test performance has     been validated by [employer] for patients greater     than or equal to 7 year old.     It is not intended     to diagnose infection nor to     guide or monitor treatment.     RESULT CALLED TO, READ BACK BY AND VERIFIED WITH:     A.MOORE,RN 2214 01/28/13 M.CAMPBELL  URINE CULTURE     Status: None   Collection Time    01/28/13  9:42 PM      Result Value Range Status   Specimen Description URINE, CATHETERIZED   Final   Special Requests NONE   Final   Culture  Setup Time 01/29/2013 02:37   Final   Colony Count >=100,000 COLONIES/ML   Final   Culture ESCHERICHIA COLI   Final   Report Status 01/30/2013 FINAL   Final   Organism ID, Bacteria ESCHERICHIA COLI   Final     Studies:  Recent x-ray studies have been reviewed in detail by the Attending Physician  Scheduled Meds:  Scheduled Meds: . antiseptic oral rinse  15 mL Mouth Rinse q12n4p  . chlorhexidine  15 mL Mouth/Throat BID  . digoxin  0.125 mg Intravenous Daily  . docusate sodium  100 mg Oral BID  . levothyroxine  25 mcg Oral QAC breakfast  . multivitamin with minerals  1 tablet Oral Daily  . mupirocin ointment  1 application Nasal BID   Continuous Infusions: . sodium chloride 100 mL/hr at 02/01/13 0341  .  diltiazem (CARDIZEM) infusion 10 mg/hr (02/01/13 1221)    Time spent on care of this patient: 40 min   Calvert Cantor, MD [phone]  [facility] Office  [phone] Pager - Text Page per Amion as per below:  On-Call/Text Page:      [email or web address]      password [email or web address]  If 7PM-7AM, please contact night-coverage [email or web address] Password [email or web address] 02/01/2013, 3:40 PM   LOS: 4 days

## 2013-02-01 NOTE — Progress Notes (Signed)
Subjective: 1 Day Post-Op Procedure(s) (LRB): INTRAMEDULLARY (IM) RETROGRADE FEMORAL NAILING (Bilateral) Patient not able to respond to questions.  She basically screams whenever she stops.  She does not seem to be dramatically different from preoperatively.    Objective: Vital signs in last 24 hours: Temp:  [96.9 F (36.1 C)-98.6 F (37 C)] 98.6 F (37 C) (06/05 0700) Pulse Rate:  [33-121] 100 (06/05 0630) Resp:  [11-31] 31 (06/05 0630) BP: (80-148)/(44-80) 99/64 mmHg (06/05 0630) SpO2:  [75 %-100 %] 100 % (06/05 0630) FiO2 (%):  [2 %] 2 % (06/04 1530)  Intake/Output from previous day: 06/04 0701 - 06/05 0700 In: 5501.6 [I.V.:4501.6; Blood:700; IV Piggyback:300] Out: 800 [Urine:700; Blood:100] Intake/Output this shift: Total I/O In: -  Out: 50 [Urine:50]   Recent Labs  01/30/13 0835 02/01/13 0440  HGB 8.2* 9.3*    Recent Labs  01/30/13 0835 02/01/13 0440  WBC 9.8 9.6  RBC 3.01* 3.42*  HCT 24.7* 28.9*  PLT 288 248    Recent Labs  01/31/13 0542 02/01/13 0440  NA 143 145  K 4.2 4.2  CL 113* 113*  CO2 24 24  BUN 38* 35*  CREATININE 0.50 0.43*  GLUCOSE 110* 112*  CALCIUM 8.5 8.0*   No results found for this basename: LABPT, INR,  in the last 72 hours  Intact pulses distally Dorsiflexion/Plantar flexion intact No cellulitis present Compartment soft  Assessment/Plan: 1 Day Post-Op Procedure(s) (LRB): INTRAMEDULLARY (IM) RETROGRADE FEMORAL NAILING (Bilateral) Advance diet Up with therapyOut of bed to chair. Hypotension, tachycardia, and low urine output// At this point I feel like we have done what is appropriate for the patient.  We can certainly be satisfied that she is not in severe pain or unreasonable pain from the knee because we have stabilized the bones on both sides. She continues to scream when touched but it is perhaps her baseline.  I certainly feel she could continue to have some pain from her fractures but it is not unreasonable pain. She  can be up to a chair but should be hoyer lifted.  At this point I feel like we should transfuse her for her lHigh pulse rate, hypertension, and low urine output.  At that point I feel that we should probably transfer her out of the unit and see how she does.  I think that we have done what is appropriate in terms of trying to minimize unreasonable levels of pain.  At this point I think it's a family decision as to how much continued care they would like to have.  Perhaps consult of palliative care would make some sense here as well.If there are any questions on this patient's care please don't hesitate to call me and I will continue to try to get in touch with her son.   Naydene Kamrowski L 02/01/2013, 8:22 AM Cell [phone]

## 2013-02-01 NOTE — Progress Notes (Signed)
SLP Cancellation Note  Patient Details Name: Andrea Koch MRN: [id number] DOB: 08/23/1932   Cancelled treatment:       Reason Eval/Treat Not Completed: Fatigue/lethargy limiting ability to participate. Pt resting comfortably after 2mg  of morphine given. SLP will reattempt in a few hours.   [name], MA CCC-SLP [phone]  [name] 02/01/2013, 10:05 AM

## 2013-02-01 NOTE — Progress Notes (Signed)
Updated MD regarding afternooon UO; only 100 out of foley between 12p-4p; bladder scan shows no urine in bladder again; Md also made aware we have placed patient back on 2L O2; 4 250 cc bolus' given this shift; orders received; will continue to monitor.   Vivi Martens RN

## 2013-02-01 NOTE — Evaluation (Signed)
Clinical/Bedside Swallow Evaluation Patient Details  Name: Andrea Koch MRN: [id number] Date of Birth: 05-16-1932  Today's Date: 02/01/2013 Time: 4098-1191 SLP Time Calculation (min): 15 min  Past Medical History:  Past Medical History  Diagnosis Date  . Dementia     advanced  . HTN (hypertension)   . Atrial fibrillation     paroxysmal  . Tachycardia-bradycardia syndrome     s/p PPM by Dr Deborah Chalk  . GI bleed 2/12    ulcer to ASA  . Diastolic dysfunction   . CHF (congestive heart failure)   . Pacemaker   . Coronary artery disease    Past Surgical History:  Past Surgical History  Procedure Laterality Date  . Pacemaker insertion  09/02/09    by Dr Deborah Chalk due to tachycardia bradycardia syndrome and syncope (MDT)   HPI:  Andrea Koch is a 79 year old female, who was admitted to the hospital on Sunday with bilateral femur fracture. Pt was in severe pain after fx, MD and son decided to do surgery to hopefully reduce pts pain. On 6/4 pt underwent IM femoral nailing for bilateral femur fx. Swallow eval requested. She has  dementia at baseline and during admission in April, was observed to have intermittent responsiveness to therapists and PO intake. She consumed thin liquids without overt signs of aspiration but her attention waxed and waned and often pocketed solid foods.    Assessment / Plan / Recommendation Clinical Impression  Pt is known to this SLP from previous admission. Baseline dementia causes intermittently sustained attention to PO, increasing risk of aspiration, particularly with solids. Pt has not demonstrated signs of aspiration with thin in the past. Today pt more agitated than at baseline, yelling out constantly. Pt speaking but dysarthric. Sounds like pt is saying "let me die, thats all im asking". When offered straw sips of water pt calms, takes a deep breath and deep sip. When allowed to drink too much, pt with hard coughing and desat. When sip quantity is limited the  pt appears functional. She quickly begins yelling when given solids with bolus in mouth during yelling for over one minute. Recommend pt continue thin liquid diet, given via straws. OK to attempt meds with water or puree. Strongly recommend Palliative care consult as aspiration risk is moderate due to mentation.     Aspiration Risk  Moderate    Diet Recommendation Thin liquid   Liquid Administration via: Straw Medication Administration: Whole meds with liquid Supervision: Staff feed patient Compensations: Slow rate;Small sips/bites Postural Changes and/or Swallow Maneuvers: Seated upright 90 degrees (tilt bed)    Other  Recommendations Oral Care Recommendations: Oral care BID   Follow Up Recommendations  Skilled Nursing facility    Frequency and Duration min 2x/week  2 weeks   Pertinent Vitals/Pain NA    SLP Swallow Goals Patient will utilize recommended strategies during swallow to increase swallowing safety with: Total assistance Swallow Study Goal #2 - Progress: Progressing toward goal   Swallow Study Prior Functional Status       General HPI: Andrea Koch is a 79 year old female, who was admitted to the hospital on Sunday with bilateral femur fracture. Pt was in severe pain after fx, MD and son decided to do surgery to hopefully reduce pts pain. On 6/4 pt underwent IM femoral nailing for bilateral femur fx. Swallow eval requested. She has  dementia at baseline and during admission in April, was observed to have intermittent responsiveness to therapists and PO intake. She consumed thin liquids without overt  signs of aspiration but her attention waxed and waned and often pocketed solid foods.  Type of Study: Bedside swallow evaluation Previous Swallow Assessment: BSE 4/14 - Dys1/nectar Diet Prior to this Study: Thin liquids Temperature Spikes Noted: No Respiratory Status: Supplemental O2 delivered via (comment) History of Recent Intubation: Yes Length of Intubations (days): 1  days Date extubated: 01/31/13 Behavior/Cognition: Confused;Requires cueing;Decreased sustained attention Oral Cavity - Dentition: Edentulous;Poor condition Self-Feeding Abilities: Total assist Patient Positioning: Postural control interferes with function Volitional Cough: Cognitively unable to elicit Volitional Swallow: Unable to elicit    Oral/Motor/Sensory Function Overall Oral Motor/Sensory Function: Other (comment) (unable to assess due to mentation)   Ice Chips Ice chips: Not tested   Thin Liquid Thin Liquid: Impaired Presentation: Straw Oral Phase Functional Implications: Oral holding;Prolonged oral transit Pharyngeal  Phase Impairments: Cough - Immediate;Change in Vital Signs (with large consecutive sip)    Nectar Thick Nectar Thick Liquid: Not tested   Honey Thick Honey Thick Liquid: Not tested   Puree Puree: Impaired Presentation: Spoon Oral Phase Impairments: Poor awareness of bolus;Impaired anterior to posterior transit;Reduced lingual movement/coordination Oral Phase Functional Implications: Oral holding;Prolonged oral transit Pharyngeal Phase Impairments: Suspected delayed Swallow   Solid   GO    Solid: Not tested      [name], MA CCC-SLP [phone]  [name] 02/01/2013,12:14 PM

## 2013-02-01 NOTE — Progress Notes (Signed)
Pt's bladder scan showed 0 cc urine in bladder; 3rd 250 cc bolus being administered now.  Will continue to monitor.  Vivi Martens RN

## 2013-02-01 NOTE — Progress Notes (Signed)
Utilization review completed.  

## 2013-02-01 NOTE — Op Note (Signed)
NAME:  Andrea Koch, Andrea Koch NO.:  [id number]  MEDICAL RECORD NO.:  [id number]  LOCATION:  3307                         FACILITY:  [facility]  PHYSICIAN:  Harvie Junior, M.D.   DATE OF BIRTH:  Feb 15, 1932  DATE OF PROCEDURE: DATE OF DISCHARGE:                              OPERATIVE REPORT   She is a 79 year old female on the Internal Medicine Service.  PREOPERATIVE DIAGNOSIS:  Bilateral supracondylar femur fractures.  POSTOPERATIVE DIAGNOSES: 1. Left supracondylar femur fracture. 2. Right supracondylar femur fracture with intercondylar extension.  PROCEDURE: 1. Open reduction and internal fixation of left supracondylar femur     fracture with [place] nail with 3 locking screws distally into the     nail with proximal interlock. 2. Intramedullary rod fixation of right supracondylar intercondylar     femur fracture with the [place] nail with 4 distal locking screws     and 1 proximal locking screw.  SURGEON:  Harvie Junior, M.D.  ASSISTANT:  Marshia Ly, PA.  ANESTHESIA:  General.  BRIEF HISTORY:  Andrea Koch is a 79 year old female, who was admitted to the hospital on Sunday with bilateral femur fracture.  She has unfortunate mental cognition issues.  She had bilateral supracondylar femur fractures, but she was a minimal to non-ambulator.  We thought we could try to treat this nonoperatively, and we placed her on knee immobilizers.  We followed her for the next couple of days, and unfortunately she was just having agonizing pain up on the floor, I think from muscle spasm and periosteal pain in the knee, the fractures were just moving.  I had a long talk with her son about options at that point.  We felt that it was probably reasonable to try to treat her surgically certainly on the left side.  I knew, we could treat her surgically on the right side.  I was concerned because her x-rays looked as though it was so distal that we would not be able to lock  something in distally.  We certainly could fix the left side.  We were not certain about the right side and felt like that we would either do a manipulative closed reduction or we would see if we could, with traction, get any distal imaging to see if we thought we could help her on the right side.  She was brought to the operating room for this procedure.  I had a long discussion with the son about the possibility of significant complications intraoperatively or the possibility of death intraoperatively. He certainly understood these possibilities and felt like to try to give her pain relief that it was reasonable to take her to the operating room. We agreed with that assessment, and she was brought to the operating room for these procedures.  DESCRIPTION OF PROCEDURE:  The patient was brought to the operating room, and after adequate anesthesia was obtained with a general anesthetic, the patient was placed supine on the operating table.  She then had bilateral extremities prepped and draped in the usual sterile fashion.  Following this, attention was turned to the right side where with traction, we were able to get something that looked like distally we could get  fixed on it.  It was an intercondylar extension, but it looked as though we would be able with the [facility] nail to be able to get locking distally, and so at that point, we felt like we would do the right side.  Attention was turned to the left side where after exsanguination, blood pressure tourniquet was inflated.  Sterile blood pressure tourniquet was inflated to 350 mmHg.  Following this, a midline incision was made from the patella distally to the patellar tendon insertion.  Patellar tendon was identified and directly in the center was opened.  The fat pad behind it was excised, and we were looking right at the intercondylar notch, put a guidewire just above the intercondylar notch, just above __________ line, did a  manipulative closed reduction on to a triangle and put the guidewire straight up the center of the distal piece, and once we confirmed this, we over-reamed it with a introductory reamer.  Following this, we reamed up to 14.5 and then we measured and opened a 13 x 320 mm nail, and this was advanced into the leg, locked in distally with 3 screws, 2 transverse and 1 horizontal, and we locked it in place, and I then went up proximally and did a freehand lock, irrigated the knee thoroughly, closed the tendon and closed the wounds.  The tourniquet was up for 32 minutes on the left side.  Following this, attention was turned back to the right side where we again took images up on the triangle this time, we felt that we were able to get some reasonable alignment and following this, we put the tourniquet on the right side, exsanguinated the extremity, and put the tourniquet up to 350 mmHg.  I made an incision from the patella down distally and were able to excise the fat pad and looked at the intercondylar notch.  This with palpation you could feel the intercondylar extension.  I took a Special educational needs teacher and advanced it on both the lateral and medial side, and we were able to sort a hold it and kind of get a manipulative reduction, and then once that was achieved, we basically put a guidewire just above __________ line on the lateral x- ray and then up the femur.  Once this was completed, we did an introductory reamer on this up to the femur and reamed up to 14 and then took a 300 mm rod.  This was __________ 320, and advanced this in, locked it in 4 distal locks, got good purchase, really bone loss on all of them, and the intercondylar extension looked as though it had been spanned by the screws that we were putting in.  Once this was done, the proximal screw was freehand locked, and the tourniquet was let down prior to that, that was 34 minutes on the right side.  Irrigation of the joint was thorough,  and then we closed the patellar tendon, and then closed the skin.  I did a freehand proximal lock and then put that in place.  Once that was done, final fluoroscopic images were taken on both sides and looked as though we had excellent fixation.  The legs were now moving as a unit.  We put sterile compressive dressing and put her back in knee immobilizers, and she will be taken back to the floor.  I think we are going to admit her to a step-down overnight just because of the violation of the intramedullary cavity on both sides to make sure she does  not get into respiratory issues.  Estimated blood loss for the procedure was probably 150 mL.  This is really pretty minimal.     Harvie Junior, M.D.     Ranae Plumber  D:  01/31/2013  T:  02/01/2013  Job:  [id number]

## 2013-02-01 NOTE — Progress Notes (Addendum)
Md notified pt's Urine output 100 cc total this shift after 2 250 cc bolus'; also notified of temp axillary 99.8; 1 325 mg Tylenol PO given; second not given due to safety concerns with swallowing; Speech has seen patient- she will swallow water but is pocketing pills.  Orders received; Will continue to monitor.  Vivi Martens RN

## 2013-02-02 ENCOUNTER — Encounter (HOSPITAL_COMMUNITY): Payer: Self-pay | Admitting: Orthopedic Surgery

## 2013-02-02 DIAGNOSIS — F0391 Unspecified dementia with behavioral disturbance: Secondary | ICD-10-CM | POA: Diagnosis present

## 2013-02-02 DIAGNOSIS — I10 Essential (primary) hypertension: Secondary | ICD-10-CM

## 2013-02-02 DIAGNOSIS — F03918 Unspecified dementia, unspecified severity, with other behavioral disturbance: Secondary | ICD-10-CM | POA: Diagnosis present

## 2013-02-02 LAB — BASIC METABOLIC PANEL
BUN: 34 mg/dL — ABNORMAL HIGH (ref 6–23)
CO2: 21 mEq/L (ref 19–32)
GFR calc non Af Amer: >90 mL/min (ref >90)
Glucose, Bld: 100 mg/dL — ABNORMAL HIGH (ref 70–99)
Potassium: 4.1 mEq/L (ref 3.5–5.1)

## 2013-02-02 LAB — CBC
HCT: 27 % — ABNORMAL LOW (ref 36.0–46.0)
Hemoglobin: 8.6 g/dL — ABNORMAL LOW (ref 12.0–15.0)
MCHC: 31.9 g/dL (ref 30.0–36.0)
RBC: 3.11 MIL/uL — ABNORMAL LOW (ref 3.87–5.11)

## 2013-02-02 MED ORDER — ENSURE COMPLETE PO LIQD
237.0000 mL | Freq: Two times a day (BID) | ORAL | Status: DC
  Administered 2013-02-03 – 2013-02-04 (×4): 237 mL via ORAL

## 2013-02-02 MED ORDER — DILTIAZEM HCL ER COATED BEADS 180 MG PO CP24
180.0000 mg | ORAL_CAPSULE | Freq: Every day | ORAL | Status: DC
  Filled 2013-02-02: qty 1

## 2013-02-02 MED ORDER — DILTIAZEM HCL 30 MG PO TABS
30.0000 mg | ORAL_TABLET | Freq: Four times a day (QID) | ORAL | Status: DC
  Administered 2013-02-02 – 2013-02-05 (×12): 30 mg via ORAL
  Filled 2013-02-02 (×17): qty 1

## 2013-02-02 MED ORDER — DEXTROSE 5 % IV SOLN
INTRAVENOUS | Status: DC
  Administered 2013-02-02: 14:00:00 via INTRAVENOUS
  Administered 2013-02-03: 75 mL via INTRAVENOUS

## 2013-02-02 NOTE — Progress Notes (Signed)
Subjective: 2 Days Post-Op Procedure(s) (LRB): INTRAMEDULLARY (IM) RETROGRADE FEMORAL NAILING (Bilateral) Patient reports pain as moderate.   Some moaning with rom of legs  Objective: Vital signs in last 24 hours: Temp:  [97.8 F (36.6 C)-99.1 F (37.3 C)] 99 F (37.2 C) (06/06 1100) Pulse Rate:  [35-97] 68 (06/06 1038) Resp:  [18-29] 20 (06/06 1038) BP: (83-116)/(39-77) 94/54 mmHg (06/06 1222) SpO2:  [77 %-100 %] 96 % (06/06 1038)  Intake/Output from previous day: 06/05 0701 - 06/06 0700 In: 2421.3 [I.V.:1421.3] Out: 725 [Urine:725] Intake/Output this shift: Total I/O In: 2325 [I.V.:2325] Out: 225 [Urine:225]   Recent Labs  02/01/13 0440 02/01/13 1610 02/02/13 0520  HGB 9.3* 9.2* 8.6*    Recent Labs  02/01/13 1610 02/02/13 0520  WBC 11.2* 8.8  RBC 3.35* 3.11*  HCT 28.6* 27.0*  PLT 257 257    Recent Labs  02/01/13 0440 02/02/13 0520  NA 145 148*  K 4.2 4.1  CL 113* 118*  CO2 24 21  BUN 35* 34*  CREATININE 0.43* 0.45*  GLUCOSE 112* 100*  CALCIUM 8.0* 8.2*   Bilat lower extremity exams: Knee immobilizers intact to both legs. N-V intact to ankles/feet.   Assessment/Plan: 2 Days Post-Op Procedure(s) (LRB): INTRAMEDULLARY (IM) RETROGRADE FEMORAL NAILING (Bilateral) Plan: Out of bed to chair daily nonweightbearing bilaterally with knee immobilizers in place. Medical care per hospitalists. Will follow. Dr Eldridge Scot PA covering over weekend.  Myka Hitz G 02/02/2013, 2:38 PM

## 2013-02-02 NOTE — Progress Notes (Signed)
Speech Language Pathology Dysphagia Treatment Patient Details Name: Andrea Koch MRN: [id number] DOB: Jun 18, 1932 Today's Date: 02/02/2013 Time: 4098-1191 SLP Time Calculation (min): 10 min  Assessment / Plan / Recommendation Clinical Impression  Pt seen with RN to attempt trials of pudding with pills. RN reports pt pocketed pills given with water and pill eventually dissolved in mouth. SLP offered trials of puree, pt more attentive to bolus, less yelling today. Pt transited bolus with minimal holding. When RN gave pill in puree, pt transited easily. SLP also offered sips of water via straw, again with larger sips pt had a hard cough. Otherwise, pt able to take small straw sips, contolled by caregiver without difficulty. Recommend upgrade to Dys1/thin diet though suspect intake of solids will be inconsistent.     Diet Recommendation  Initiate / Change Diet: Dysphagia 1 (puree);Thin liquid    SLP Plan Continue with current plan of care   Pertinent Vitals/Pain NA   Swallowing Goals  SLP Swallowing Goals Patient will utilize recommended strategies during swallow to increase swallowing safety with: Total assistance Swallow Study Goal #2 - Progress: Progressing toward goal  General Temperature Spikes Noted: No Respiratory Status: Supplemental O2 delivered via (comment) Behavior/Cognition: Confused;Requires cueing;Decreased sustained attention Oral Cavity - Dentition: Edentulous;Poor condition Patient Positioning: Upright in bed  Oral Cavity - Oral Hygiene Does patient have any of the following "at risk" factors?: Lips - dry, cracked;Mucous Membranes - reddened;Saliva - thick, dry mouth;Nutritional status - dependent feeder;Oxygen therapy - cannula, mask, simple oxygen devices Patient is HIGH RISK - Oral Care Protocol followed (see row info): Yes   Dysphagia Treatment Treatment focused on: Skilled observation of diet tolerance;Upgraded PO texture trials;Facilitation of oral  phase Treatment Methods/Modalities: Skilled observation;Differential diagnosis Patient observed directly with PO's: Yes Type of PO's observed: Dysphagia 1 (puree);Thin liquids Feeding: Total assist Liquids provided via: Straw Oral Phase Signs & Symptoms: Prolonged bolus formation;Prolonged oral phase Pharyngeal Phase Signs & Symptoms: Immediate cough Type of cueing: Verbal Amount of cueing: Minimal   GO    [name], MA CCC-SLP [phone]  [name] 02/02/2013, 10:05 AM

## 2013-02-02 NOTE — Progress Notes (Signed)
NUTRITION FOLLOW UP  Intervention:    Ensure Complete PO BID to maximize oral intake, each supplement provides 350 kcal and 13 grams of protein.  Nutrition Dx:   Increased nutrient needs related to wound healing as evidenced by estimated nutrition needs. Ongoing.  Goal:   Adequate PO intake to support wound healing. Unmet.  Monitor:   PO intake, weight trend, wound healing.  Assessment:   S/P ORIF left supracondylar femur fracture and IM rod fixation of right supracondylar intercondylar femur fracture on 6/5. Knee immobilizers are in place with plans to get out of bed to chair daily with non-weight bearing status.  SLP following for swallowing treatment. Diet advanced to dysphagia 1 with thin liquids this morning. Patient with increased needs to support healing. Per discussion with RN, patient was yelling with mashed potatoes in her mouth at lunch today; does not feel that patient is safe to take foods PO, however, patient does very well with liquids via straw.   Height: Ht Readings from Last 1 Encounters:  01/28/13 5\' 4"  (1.626 m)    Weight Status:   Wt Readings from Last 1 Encounters:  01/31/13 140 lb 3.4 oz (63.6 kg)  01/29/13  141 lb 8.6 oz (64.2 kg)   Re-estimated needs:  Kcal: 1500-1700  Protein: 70-80 gm  Fluid: 1.5-1.7 L   Skin: stage II pressure ulcer on sacrum  Diet Order: Dysphagia 1 with thin liquids   Intake/Output Summary (Last 24 hours) at 02/02/13 1512 Last data filed at 02/02/13 1500  Gross per 24 hour  Intake 5196.25 ml  Output    850 ml  Net 4346.25 ml    Last BM: 6/3   Labs:   Recent Labs Lab 01/30/13 0540 01/31/13 0542 02/01/13 0440 02/02/13 0520  NA 138 143 145 148*  K 4.1 4.2 4.2 4.1  CL 104 113* 113* 118*  CO2 22 24 24 21   BUN 36* 38* 35* 34*  CREATININE 0.51 0.50 0.43* 0.45*  CALCIUM 8.6 8.5 8.0* 8.2*  MG 2.2  --   --   --   GLUCOSE 114* 110* 112* 100*    CBG (last 3)  No results found for this basename: GLUCAP,  in the  last 72 hours  Scheduled Meds: . antiseptic oral rinse  15 mL Mouth Rinse q12n4p  . chlorhexidine  15 mL Mouth/Throat BID  . digoxin  0.125 mg Intravenous Daily  . diltiazem  30 mg Oral Q6H  . docusate sodium  100 mg Oral BID  . enoxaparin (LOVENOX) injection  40 mg Subcutaneous Q24H  . levothyroxine  25 mcg Oral QAC breakfast  . multivitamin with minerals  1 tablet Oral Daily    Continuous Infusions: . dextrose 75 mL/hr at 02/02/13 1415    Joaquin Courts, RD, LDN, CNSC Pager [phone] After Hours Pager [phone]

## 2013-02-02 NOTE — Progress Notes (Signed)
[facility] Progress Note [place] TEAM 1 - Stepdown/ICU TEAM   Andrea Koch HYQ:[id number] DOB: 1932-05-01 DOA: 01/28/2013 PCP: Provider Not In System  Brief narrative: Patient is a 79 year old female with severe advanced dementia, hypertension, coronary artery disease, paroxysmal atrial fibrillation on digoxin, history of tachybradycardia syndrome was transferred from skilled nursing facility after a fall and fracture of her left femur. She was also noted to be in A-fib.  She has a h/o of a right femoral fracture- She underwent ORIF of bilateral femoral fractures on 6/4.    Assessment/Plan: Femur fracture, left and right  -surgery performed due to suspicion of persistent extreme pain (pt screaming constantly) however, after surgery, she continues to have the same behavior -Appreciate orthopedic followup  -Pain control with IV morphine and continue oral Vicodin as tolerated- pt having trouble taking POs on and off due to confusion  E. coli UTI  -Continue ceftriaxone - day 4  - Discontinue Foley as soon as possible   Paroxysmal atrial fibrillation  developed RVR am 01/30/13--HR 140>>120s.  Heart rate trended down to the 80s with increased digoxin.  -HR has improved with fluid boluses- will attempt to switch back to oral Cardizem today (if pt takes PO)  Poor urine output - giving fluid boluses today- total of 750 cc already- - will decrease fluid back today to prevent fluid overload  Hypothyroidism  -continue synthroid. Repeat TSH in 4-6 week   Dementia - Stable   Microcytic anemia  ferritin normal, but low iron and iron saturations. Transferrin was not sent. Occult stool has not been sent yet  - Will start oral iron once tolerating a diet   Code Status: DNR Family Communication: none Disposition Plan: follow in SDU until HR controlled off of IV Cardizem  Consultants: Ortho  Procedures: ORIF- b/l femurs  Antibiotics: Rocephin 6/2 Ancef- peri-op  DVT  prophylaxis: SCDs  HPI/Subjective: Pt still confused but less moaning today   Objective: Blood pressure 94/54, pulse 68, temperature 99 F (37.2 C), temperature source Oral, resp. rate 20, height 5\' 4"  (1.626 m), weight 63.6 kg (140 lb 3.4 oz), SpO2 96.00%.  Intake/Output Summary (Last 24 hours) at 02/02/13 1400 Last data filed at 02/02/13 1116  Gross per 24 hour  Intake 4746.25 ml  Output    850 ml  Net 3896.25 ml     Exam: General: No acute respiratory distress Lungs: Clear to auscultation bilaterally without wheezes or crackles Cardiovascular: Iregular rate and rhythm without murmur gallop or rub normal S1 and S2 Abdomen: Nontender, nondistended, soft, bowel sounds positive, no rebound, no ascites, no appreciable mass Extremities: No significant cyanosis, clubbing- b/l knees in brace edema bilateral lower extremities  Data Reviewed: Basic Metabolic Panel:  Recent Labs Lab 01/29/13 0730 01/30/13 0540 01/31/13 0542 02/01/13 0440 02/02/13 0520  NA 135 138 143 145 148*  K 4.5 4.1 4.2 4.2 4.1  CL 100 104 113* 113* 118*  CO2 24 22 24 24 21   GLUCOSE 117* 114* 110* 112* 100*  BUN 29* 36* 38* 35* 34*  CREATININE 0.52 0.51 0.50 0.43* 0.45*  CALCIUM 8.4 8.6 8.5 8.0* 8.2*  MG  --  2.2  --   --   --    Liver Function Tests:  Recent Labs Lab 01/28/13 1744  AST 27  ALT 22  ALKPHOS 124*  BILITOT 0.5  PROT 6.9  ALBUMIN 3.1*   No results found for this basename: LIPASE, AMYLASE,  in the last 168 hours No results found for  this basename: AMMONIA,  in the last 168 hours CBC:  Recent Labs Lab 01/28/13 1744 01/30/13 0835 02/01/13 0440 02/01/13 1610 02/02/13 0520  WBC 6.3 9.8 9.6 11.2* 8.8  NEUTROABS 4.0  --   --   --   --   HGB 11.5* 8.2* 9.3* 9.2* 8.6*  HCT 34.2* 24.7* 28.9* 28.6* 27.0*  MCV 80.3 82.1 84.5 85.4 86.8  PLT 310 288 248 257 257   Cardiac Enzymes: No results found for this basename: CKTOTAL, CKMB, CKMBINDEX, TROPONINI,  in the last 168  hours BNP (last 3 results) No results found for this basename: PROBNP,  in the last 8760 hours CBG: No results found for this basename: GLUCAP,  in the last 168 hours  Recent Results (from the past 240 hour(s))  SURGICAL PCR SCREEN     Status: Abnormal   Collection Time    01/28/13  8:32 PM      Result Value Range Status   MRSA, PCR NEGATIVE  NEGATIVE Final   Staphylococcus aureus POSITIVE (*) NEGATIVE Final   Comment:            The Xpert SA Assay (FDA     approved for NASAL specimens     in patients over 32 years of age),     is one component of     a comprehensive surveillance     program.  Test performance has     been validated by [employer] for patients greater     than or equal to 66 year old.     It is not intended     to diagnose infection nor to     guide or monitor treatment.     RESULT CALLED TO, READ BACK BY AND VERIFIED WITH:     A.MOORE,RN 2214 01/28/13 M.[facility]  URINE CULTURE     Status: None   Collection Time    01/28/13  9:42 PM      Result Value Range Status   Specimen Description URINE, CATHETERIZED   Final   Special Requests NONE   Final   Culture  Setup Time 01/29/2013 02:37   Final   Colony Count >=100,000 COLONIES/ML   Final   Culture ESCHERICHIA COLI   Final   Report Status 01/30/2013 FINAL   Final   Organism ID, Bacteria ESCHERICHIA COLI   Final     Studies:  Recent x-ray studies have been reviewed in detail by the Attending Physician  Scheduled Meds:  Scheduled Meds: . antiseptic oral rinse  15 mL Mouth Rinse q12n4p  . chlorhexidine  15 mL Mouth/Throat BID  . digoxin  0.125 mg Intravenous Daily  . diltiazem  30 mg Oral Q6H  . docusate sodium  100 mg Oral BID  . enoxaparin (LOVENOX) injection  40 mg Subcutaneous Q24H  . levothyroxine  25 mcg Oral QAC breakfast  . multivitamin with minerals  1 tablet Oral Daily   Continuous Infusions: . sodium chloride 100 mL/hr at 02/02/13 1030    Time spent on care of this patient: 40  min   Calvert Cantor, MD [phone]  [facility] Office  [phone] Pager - Text Page per Amion as per below:  On-Call/Text Page:      [email or web address]      password [email or web address]  If 7PM-7AM, please contact night-coverage [email or web address] Password [email or web address] 02/02/2013, 2:00 PM   LOS: 5 days

## 2013-02-03 LAB — BASIC METABOLIC PANEL
BUN: 30 mg/dL — ABNORMAL HIGH (ref 6–23)
CO2: 25 mEq/L (ref 19–32)
Calcium: 8.2 mg/dL — ABNORMAL LOW (ref 8.4–10.5)
Creatinine, Ser: 0.41 mg/dL — ABNORMAL LOW (ref 0.50–1.10)
GFR calc non Af Amer: >90 mL/min (ref >90)
Glucose, Bld: 125 mg/dL — ABNORMAL HIGH (ref 70–99)

## 2013-02-03 LAB — CBC
HCT: 25.2 % — ABNORMAL LOW (ref 36.0–46.0)
Hemoglobin: 8.2 g/dL — ABNORMAL LOW (ref 12.0–15.0)
MCH: 27.8 pg (ref 26.0–34.0)
MCHC: 32.5 g/dL (ref 30.0–36.0)
MCV: 85.4 fL (ref 78.0–100.0)

## 2013-02-03 MED ORDER — DEXTROSE 5 % IV SOLN: INTRAVENOUS | Status: DC

## 2013-02-03 NOTE — Progress Notes (Signed)
Pt transferred to room 4742 via bed on monitor and O2. Transported by Lestine Mount and Marylene Land RN. Bedside report given to Sharlynn Oliphant, RN. Time allowed for questions/concerns. Bed low, side rails up x 3, bed locked, call light within reach.   Alwyn Ren RN

## 2013-02-03 NOTE — Progress Notes (Signed)
Voice mail message left for patient's son Andrea Koch at his home phone number. Message pertains to the transfer of Andrea Koch from room 3307 to room 4742.

## 2013-02-03 NOTE — Progress Notes (Signed)
PT Cancellation Note  Patient Details Name: [name] MRN: [id number] DOB: 27-Oct-1931   Cancelled Treatment:    Reason Eval/Treat Not Completed: Other (comment)  Patient has advanced dementia and is NWB on bil. LE's.  Spoke with nursing - will need to use lift equipment to move patient OOB.  Patient not appropriate for PT services at this time.  Thank you.   Vena Austria 02/03/2013, 12:27 PM Durenda Hurt. Renaldo Fiddler, [facility] Acute Rehab Services Pager [phone]

## 2013-02-03 NOTE — Progress Notes (Signed)
[facility] Progress Note [place] TEAM 1 - Stepdown/ICU TEAM   Andrea Koch RUE:[id number] DOB: [birth year] DOA: 01/28/2013 PCP: Provider Not In System  Brief narrative: Patient is a 79 year old female with severe advanced dementia, hypertension, coronary artery disease, paroxysmal atrial fibrillation on digoxin, history of tachybradycardia syndrome was transferred from skilled nursing facility after a fall and fracture of her left femur. She was also noted to be in A-fib.  She has a h/o of a right femoral fracture- She underwent ORIF of bilateral femoral fractures on 6/4.    Assessment/Plan: Femur fracture, left and right  -surgery performed due to suspicion of persistent extreme pain -Appreciate orthopedic followup  -Pain control with IV morphine and oral Vicodin as tolerated-  E. coli UTI  -Continue ceftriaxone - day 5 - Discontinue Foley as soon as able   Paroxysmal atrial fibrillation  developed RVR am 01/30/13--HR 140>>120s.  Heart rate trended down to the 80s with increased digoxin.  -HR has improved with fluid boluses - have switched to short acting oral cardizem - pt only taking pills in crushed form  Poor urine output - occurred after surgery likely due to blood loss - corrected after fluid boluses  Hypothyroidism  -continue synthroid. Repeat TSH in 4-6 week   Dementia- severe - Stable   Microcytic anemia  ferritin normal, but low iron and iron saturations. Transferrin was not sent.  - Will start oral iron   Code Status: DNR Family Communication: none Disposition Plan: return to nursing facility on Monday  Consultants: Ortho  Procedures: ORIF- b/l femurs  Antibiotics: Rocephin 6/2 Ancef- peri-op  DVT prophylaxis: SCDs  HPI/Subjective: Pt  confused -- will not follow commands- non-verbal   Objective: Blood pressure 119/76, pulse 91, temperature 97.3 F (36.3 C), temperature source Oral, resp. rate 20, height 5\' 4"  (1.626 m), weight 71  kg (156 lb 8.4 oz), SpO2 100.00%.  Intake/Output Summary (Last 24 hours) at 02/03/13 1609 Last data filed at 02/03/13 1132  Gross per 24 hour  Intake 1781.25 ml  Output    725 ml  Net 1056.25 ml     Exam: General: No acute respiratory distress Lungs: Clear to auscultation bilaterally without wheezes or crackles Cardiovascular: Iregular rate and rhythm without murmur gallop or rub normal S1 and S2 Abdomen: Nontender, nondistended, soft, bowel sounds positive, no rebound, no ascites, no appreciable mass Extremities: No significant cyanosis, clubbing- b/l knees in brace edema bilateral lower extremities  Data Reviewed: Basic Metabolic Panel:  Recent Labs Lab 01/30/13 0540 01/31/13 0542 02/01/13 0440 02/02/13 0520 02/03/13 0525  NA 138 143 145 148* 147*  K 4.1 4.2 4.2 4.1 3.8  CL 104 113* 113* 118* 117*  CO2 22 24 24 21 25   GLUCOSE 114* 110* 112* 100* 125*  BUN 36* 38* 35* 34* 30*  CREATININE 0.51 0.50 0.43* 0.45* 0.41*  CALCIUM 8.6 8.5 8.0* 8.2* 8.2*  MG 2.2  --   --   --   --    Liver Function Tests:  Recent Labs Lab 01/28/13 1744  AST 27  ALT 22  ALKPHOS 124*  BILITOT 0.5  PROT 6.9  ALBUMIN 3.1*   No results found for this basename: LIPASE, AMYLASE,  in the last 168 hours No results found for this basename: AMMONIA,  in the last 168 hours CBC:  Recent Labs Lab 01/28/13 1744 01/30/13 0835 02/01/13 0440 02/01/13 1610 02/02/13 0520 02/03/13 0525  WBC 6.3 9.8 9.6 11.2* 8.8 10.9*  NEUTROABS 4.0  --   --   --   --   --  HGB 11.5* 8.2* 9.3* 9.2* 8.6* 8.2*  HCT 34.2* 24.7* 28.9* 28.6* 27.0* 25.2*  MCV 80.3 82.1 84.5 85.4 86.8 85.4  PLT 310 288 248 257 257 291   Cardiac Enzymes: No results found for this basename: CKTOTAL, CKMB, CKMBINDEX, TROPONINI,  in the last 168 hours BNP (last 3 results) No results found for this basename: PROBNP,  in the last 8760 hours CBG: No results found for this basename: GLUCAP,  in the last 168 hours  Recent Results  (from the past 240 hour(s))  SURGICAL PCR SCREEN     Status: Abnormal   Collection Time    01/28/13  8:32 PM      Result Value Range Status   MRSA, PCR NEGATIVE  NEGATIVE Final   Staphylococcus aureus POSITIVE (*) NEGATIVE Final   Comment:            The Xpert SA Assay (FDA     approved for NASAL specimens     in patients over 39 years of age),     is one component of     a comprehensive surveillance     program.  Test performance has     been validated by [employer] for patients greater     than or equal to 73 year old.     It is not intended     to diagnose infection nor to     guide or monitor treatment.     RESULT CALLED TO, READ BACK BY AND VERIFIED WITH:     A.MOORE,RN 2214 01/28/13 M.CAMPBELL  URINE CULTURE     Status: None   Collection Time    01/28/13  9:42 PM      Result Value Range Status   Specimen Description URINE, CATHETERIZED   Final   Special Requests NONE   Final   Culture  Setup Time 01/29/2013 02:37   Final   Colony Count >=100,000 COLONIES/ML   Final   Culture ESCHERICHIA COLI   Final   Report Status 01/30/2013 FINAL   Final   Organism ID, Bacteria ESCHERICHIA COLI   Final     Studies:  Recent x-ray studies have been reviewed in detail by the Attending Physician  Scheduled Meds:  Scheduled Meds: . antiseptic oral rinse  15 mL Mouth Rinse q12n4p  . chlorhexidine  15 mL Mouth/Throat BID  . digoxin  0.125 mg Intravenous Daily  . diltiazem  30 mg Oral Q6H  . docusate sodium  100 mg Oral BID  . enoxaparin (LOVENOX) injection  40 mg Subcutaneous Q24H  . feeding supplement  237 mL Oral BID BM  . levothyroxine  25 mcg Oral QAC breakfast  . multivitamin with minerals  1 tablet Oral Daily   Continuous Infusions: . dextrose 75 mL (02/03/13 0858)    Time spent on care of this patient: 20 min   Calvert Cantor, MD [phone]  [facility] Office  [phone] Pager - Text Page per Amion as per below:  On-Call/Text Page:      [email or web address]       password [email or web address]  If 7PM-7AM, please contact night-coverage [email or web address] Password [email or web address] 02/03/2013, 4:09 PM   LOS: 6 days

## 2013-02-03 NOTE — Progress Notes (Signed)
Pt transferred from 3300, pt grimacing in pain when providing care, PRN morphine given as ordered, pt appears to be more comfortable after medication, vss, pt repositioned Q2-3 hours, mouth care given to pt, will continue to monitor

## 2013-02-03 NOTE — Progress Notes (Signed)
3 Days PO BIL IM retrograde femoral nailing. Pt reports yes to pain improved after surgery but still moderate pain, difficult to get answers from pt this morning secondary to sedation.   BP 105/55  Pulse 63  Temp(Src) 98.1 F (36.7 C) (Oral)  Resp 23  Ht 5\' 4"  (1.626 m)  Wt 63.6 kg (140 lb 3.4 oz)  BMI 24.06 kg/m2  SpO2 99% Pt laying comfortably in hospital bed, moaning only with ROM of legs, BIL knee immobilizers in place and fitting appropriately. Dressings CDI, 2+ DPP and popliteal pulses = BIL, Full ROM at ankles. Neurovascularly intact, sensation intact  A/P  3 Days PO BIL Palliative IM retrograde femoral nailing per Dr. Luiz Blare team  -OOB to chair daily, NWB, BIL knee immobilizers  -Pain control appropriate, pt has decreased moaning and decreased self reported pain  -Followed actively my medicine team for medical coverage   -UTI   -Paroxysmal afib   -Hypothyroidism   -Dementia   -Microcytic anemia  -D/C to SNF when bed available

## 2013-02-04 DIAGNOSIS — L8993 Pressure ulcer of unspecified site, stage 3: Secondary | ICD-10-CM

## 2013-02-04 DIAGNOSIS — L89109 Pressure ulcer of unspecified part of back, unspecified stage: Secondary | ICD-10-CM

## 2013-02-04 LAB — BASIC METABOLIC PANEL
BUN: 22 mg/dL (ref 6–23)
Creatinine, Ser: 0.33 mg/dL — ABNORMAL LOW (ref 0.50–1.10)
GFR calc Af Amer: >90 mL/min (ref >90)
GFR calc non Af Amer: >90 mL/min (ref >90)
Glucose, Bld: 356 mg/dL — ABNORMAL HIGH (ref 70–99)

## 2013-02-04 LAB — GLUCOSE, CAPILLARY: Glucose-Capillary: 110 mg/dL — ABNORMAL HIGH (ref 70–99)

## 2013-02-04 MED ORDER — INSULIN ASPART 100 UNIT/ML ~~LOC~~ SOLN: 0.0000 [IU] | Freq: Three times a day (TID) | SUBCUTANEOUS | Status: DC

## 2013-02-04 NOTE — Progress Notes (Signed)
Patient continues to moan and scream during body manipulation. IV medications given. Patient rested quietly during most of the night.

## 2013-02-04 NOTE — Progress Notes (Signed)
Pt. up to chair today per MD orders, several beats of tachybrady today, patient has prior history of tachybrady, remained asymptomatic, pt yells out and pain medication given, but unable to console. Overlay mattress on bed, SCDs on, will continue to monitor.

## 2013-02-04 NOTE — Progress Notes (Addendum)
[facility] Progress Note [place] TEAM 1 - Stepdown/ICU TEAM   Andrea Koch WUJ:[id number] DOB: [birth year] DOA: 01/28/2013 PCP: Provider Not In System  Brief narrative: Patient is a 79 year old female with severe advanced dementia, hypertension, coronary artery disease, paroxysmal atrial fibrillation on digoxin, history of tachybradycardia syndrome was transferred from skilled nursing facility after a fall and fracture of her left femur. She was also noted to be in A-fib.  She has a h/o of a right femoral fracture- She underwent ORIF of bilateral femoral fractures on 6/4.    Assessment/Plan: Femur fracture, left and right ORIF of bilateral supracondylar femur fractures on June 4 -surgery performed due to suspicion of persistent extreme pain -Appreciate orthopedic followup  -Pain control with IV morphine and oral Vicodin as tolerated - Holding anticoagulation for her possibility of DVT post femur fracture surgery secondary to history of severe GI bleed. - The patient has not been on anticoagulation despite a high CHADS2 score for her atrial fibrillation for the same reason.  E. coli UTI STATUS post 5 days of ceftriaxone, foley removed.  Paroxysmal atrial fibrillation, developed RVR am 01/30/13--HR 140>>120s.  Heart rate trended down to the 80s with increased digoxin.  Heart rate now stable, and she has intermittent paced beats when she had a slight pause in ventricular rate - have switched to Andrea Koch acting oral cardizem - pt only taking pills in crushed form - Continue digoxin -  Continue telemetry  Poor urine output, occurred after surgery likely due to blood loss.  Per flow sheet only made of urine the patient is incontinent of urine.  Her BUN and creatinine continue to trend down.  Hypothyroidism  -continue synthroid. Repeat TSH in 4-6 week   Dementia- severe - Stable  -  mitts as needed  Hypernatremia now hyponatremia -  Discontinue D5W infusion -   Monitor oral  intake carefully  Hyperglycemia, likely due to dextrose fluids -  DC dextrose infusion -  A sliding scale insulin for now -  Fingersticks become stable, can discontinue the sliding scale insulin. - Hemoglobin A1c  Microcytic anemia, ferritin normal, but low iron and iron saturations. Transferrin was not sent.  - Will start oral iron   Diet: Dysphagia 1 with thin liquid Access: Right hand peripheral IV IV fluids: Off DVT prophylaxis: Lovenox  Code Status: DNR Family Communication: none Disposition Plan: return to nursing facility on Monday  Consultants: Ortho  Procedures: ORIF- b/l femurs June 4th by Dr. Jodi Koch  Antibiotics: Rocephin 6/2 > 6/7 Ancef- peri-op  DVT prophylaxis: SCDs  HPI/Subjective: Pt  confused -- will not follow commands- non-verbal   Objective: Blood pressure 103/67, pulse 86, temperature 97.8 F (36.6 C), temperature source Oral, resp. rate 20, height 5\' 4"  (1.626 m), weight 72 kg (158 lb 11.7 oz), SpO2 95.00%.  Intake/Output Summary (Last 24 hours) at 02/04/13 1523 Last data filed at 02/04/13 1352  Gross per 24 hour  Intake   1220 ml  Output      0 ml  Net   1220 ml     Exam: General: No acute respiratory distress Lungs: Rales heard in the low axillary area bilaterally, no wheezes or rhonchi, no increased work of breathing Cardiovascular: Iregular rate and rhythm without murmur gallop or rub normal S1 and S2 Abdomen: Nontender, nondistended, soft, bowel sounds positive, no rebound, no ascites, no appreciable mass Extremities: No significant cyanosis, clubbing- b/l knees in brace edema bilateral lower extremities.  2+ pedal pulses, warm extremities, less  than 2 second capillary refill. Neuro: Patient able to wiggle toes on bilateral lower extremities  Data Reviewed: Basic Metabolic Panel:  Recent Labs Lab 01/30/13 0540 01/31/13 0542 02/01/13 0440 02/02/13 0520 02/03/13 0525 02/04/13 0500  NA 138 143 145 148* 147* 133*  K 4.1  4.2 4.2 4.1 3.8 3.7  CL 104 113* 113* 118* 117* 104  CO2 22 24 24 21 25 25   GLUCOSE 114* 110* 112* 100* 125* 356*  BUN 36* 38* 35* 34* 30* 22  CREATININE 0.51 0.50 0.43* 0.45* 0.41* 0.33*  CALCIUM 8.6 8.5 8.0* 8.2* 8.2* 7.4*  MG 2.2  --   --   --   --   --    Liver Function Tests:  Recent Labs Lab 01/28/13 1744  AST 27  ALT 22  ALKPHOS 124*  BILITOT 0.5  PROT 6.9  ALBUMIN 3.1*   No results found for this basename: LIPASE, AMYLASE,  in the last 168 hours No results found for this basename: AMMONIA,  in the last 168 hours CBC:  Recent Labs Lab 01/28/13 1744 01/30/13 0835 02/01/13 0440 02/01/13 1610 02/02/13 0520 02/03/13 0525  WBC 6.3 9.8 9.6 11.2* 8.8 10.9*  NEUTROABS 4.0  --   --   --   --   --   HGB 11.5* 8.2* 9.3* 9.2* 8.6* 8.2*  HCT 34.2* 24.7* 28.9* 28.6* 27.0* 25.2*  MCV 80.3 82.1 84.5 85.4 86.8 85.4  PLT 310 288 248 257 257 291   Cardiac Enzymes: No results found for this basename: CKTOTAL, CKMB, CKMBINDEX, TROPONINI,  in the last 168 hours BNP (last 3 results) No results found for this basename: PROBNP,  in the last 8760 hours CBG: No results found for this basename: GLUCAP,  in the last 168 hours  Recent Results (from the past 240 hour(s))  SURGICAL PCR SCREEN     Status: Abnormal   Collection Time    01/28/13  8:32 PM      Result Value Range Status   MRSA, PCR NEGATIVE  NEGATIVE Final   Staphylococcus aureus POSITIVE (*) NEGATIVE Final   Comment:            The Xpert SA Assay (FDA     approved for NASAL specimens     in patients over 70 years of age),     is one component of     a comprehensive surveillance     program.  Test performance has     been validated by [employer] for patients greater     than or equal to 59 year old.     It is not intended     to diagnose infection nor to     guide or monitor treatment.     RESULT CALLED TO, READ BACK BY AND VERIFIED WITH:     A.MOORE,RN 2214 01/28/13 Andrea Koch  URINE CULTURE     Status:  None   Collection Time    01/28/13  9:42 PM      Result Value Range Status   Specimen Description URINE, CATHETERIZED   Final   Special Requests NONE   Final   Culture  Setup Time 01/29/2013 02:37   Final   Colony Count >=100,000 COLONIES/ML   Final   Culture ESCHERICHIA COLI   Final   Report Status 01/30/2013 FINAL   Final   Organism ID, Bacteria ESCHERICHIA COLI   Final     Studies:  Recent x-ray studies have been reviewed in  detail by the Attending Physician  Scheduled Meds:  Scheduled Meds: . antiseptic oral rinse  15 mL Mouth Rinse q12n4p  . chlorhexidine  15 mL Mouth/Throat BID  . digoxin  0.125 mg Intravenous Daily  . diltiazem  30 mg Oral Q6H  . docusate sodium  100 mg Oral BID  . enoxaparin (LOVENOX) injection  40 mg Subcutaneous Q24H  . feeding supplement  237 mL Oral BID BM  . levothyroxine  25 mcg Oral QAC breakfast  . multivitamin with minerals  1 tablet Oral Daily   Continuous Infusions:    Time spent on care of this patient: 20 min   Renae Fickle, MD [phone]  [facility] Office  [phone] Pager - Text Page per Amion as per below:  On-Call/Text Page:      [email or web address]      password [email or web address]  If 7PM-7AM, please contact night-coverage [email or web address] Password [email or web address] 02/04/2013, 3:23 PM   LOS: 7 days

## 2013-02-05 LAB — BASIC METABOLIC PANEL
Chloride: 108 mEq/L (ref 96–112)
GFR calc non Af Amer: >90 mL/min (ref >90)
Glucose, Bld: 129 mg/dL — ABNORMAL HIGH (ref 70–99)
Potassium: 4.6 mEq/L (ref 3.5–5.1)
Sodium: 141 mEq/L (ref 135–145)

## 2013-02-05 LAB — HEMOGLOBIN A1C: Mean Plasma Glucose: 100 mg/dL (ref <117)

## 2013-02-05 LAB — GLUCOSE, CAPILLARY: Glucose-Capillary: 116 mg/dL — ABNORMAL HIGH (ref 70–99)

## 2013-02-05 MED ORDER — HYDROCODONE-ACETAMINOPHEN 5-325 MG PO TABS: 1.0000 | ORAL_TABLET | Freq: Four times a day (QID) | ORAL | Status: AC | PRN

## 2013-02-05 MED ORDER — HYDROCODONE-ACETAMINOPHEN 5-325 MG PO TABS: 1.0000 | ORAL_TABLET | Freq: Four times a day (QID) | ORAL | Status: DC | PRN

## 2013-02-05 MED ORDER — CLONAZEPAM 0.5 MG PO TABS: 0.5000 mg | ORAL_TABLET | Freq: Four times a day (QID) | ORAL | Status: AC | PRN

## 2013-02-05 MED ORDER — POLYETHYLENE GLYCOL 3350 17 G PO PACK: 17.0000 g | PACK | Freq: Every day | ORAL | Status: AC | PRN

## 2013-02-05 MED ORDER — DSS 100 MG PO CAPS: 100.0000 mg | ORAL_CAPSULE | Freq: Two times a day (BID) | ORAL | Status: AC

## 2013-02-05 MED ORDER — DIGOXIN 125 MCG PO TABS
0.1250 mg | ORAL_TABLET | Freq: Every day | ORAL | Status: DC
  Filled 2013-02-05: qty 1

## 2013-02-05 MED ORDER — FUROSEMIDE 20 MG PO TABS: 20.0000 mg | ORAL_TABLET | Freq: Every day | ORAL | Status: AC | PRN

## 2013-02-05 MED ORDER — BISACODYL 10 MG RE SUPP: 10.0000 mg | Freq: Every day | RECTAL | Status: AC | PRN

## 2013-02-05 MED ORDER — DILTIAZEM HCL 30 MG PO TABS: 30.0000 mg | ORAL_TABLET | Freq: Four times a day (QID) | ORAL | Status: AC

## 2013-02-05 MED ORDER — BIOTENE DRY MOUTH MT LIQD: 15.0000 mL | Freq: Two times a day (BID) | OROMUCOSAL | Status: DC

## 2013-02-05 MED ORDER — ENSURE COMPLETE PO LIQD: 237.0000 mL | Freq: Two times a day (BID) | ORAL | Status: DC

## 2013-02-05 MED ORDER — DIGOXIN 125 MCG PO TABS: 0.1250 mg | ORAL_TABLET | Freq: Every day | ORAL | Status: AC

## 2013-02-05 NOTE — Progress Notes (Signed)
Report called tp Dennie Bible at [facility]. Patient discharged per [facility]. Written discharge instructions given to [facility] to accompany patient to SNF

## 2013-02-05 NOTE — Clinical Documentation Improvement (Signed)
  DOCUMENTATION CLARIFICATIONS ARE NOT PART OF THE PERMANENT MEDICAL RECORD          02/05/13   Dr. Malachi Bonds  In an effort to better capture your patient's severity of illness, reflect appropriate length of stay and utilization of resources, a review of the patient medical record has revealed the following information:  "Poor urine output, occurred after surgery likely due to blood loss and improved with IV fluids. Her BUN and creatinine trended down."  Discharge Summaries signed by Renae Fickle, MD at 02/05/2013 11:50 AM   Patient received 2 units of PRBC's by Anesthesia.   CBC Results this admission: Component     Latest Ref Rng 01/28/2013  Hemoglobin     12.0 - 15.0 g/dL 38.7 (L)  HCT     56.4 - 46.0 % 34.2 (L)   01/30/2013 02/01/2013 02/01/2013 02/02/2013 02/03/2013  8.2 (L) 9.3 (L) 9.2 (L) 8.6 (L) 8.2 (L)  24.7 (L) 28.9 (L) 28.6 (L) 27.0 (L) 25.2 (L)     Based on your clinical judgment, please document the condition associated with "blood loss" in the discharge summary:   - Acute Blood Loss aAemia   - Other Condition   - Unable to Clinically Determine     In responding to this query please exercise your independent judgment.     The fact that a query is asked, does not imply that any particular answer is desired or expected.    Reviewed: additional documentation in the medical record  Thank You,  Jerral Ralph  RN BSN CCDS Certified Clinical Documentation Specialist: Cell   [phone]  Health Information Management [place]    RESPOND TO THE THIS QUERY, FOLLOW THE INSTRUCTIONS BELOW:  1. If needed, update documentation for the patient's encounter via the notes activity.  2. Access this query again and click edit on the In [employer].  3. After updating, or not, click F2 to complete all highlighted (required) fields concerning your review. Select "additional documentation in the medical record" OR "no additional documentation provided".  4. Click  Sign note button.  5. The deficiency will fall out of your In Basket *Please let us know if you are not able to complete this workflow by phone or e-mail (listed below).

## 2013-02-05 NOTE — Progress Notes (Signed)
SLP Cancellation Note  Patient Details Name: Andrea Koch MRN: [id number] DOB: [birth year]   Cancelled treatment:        Attempted to see earlier today.  Pt. Receiving bath and presently Care Link her to take pt. To SNF (?)   Royce Macadamia 02/05/2013, 2:10 PM

## 2013-02-05 NOTE — Discharge Summary (Addendum)
Physician Discharge Summary  Andrea Koch ZOX:[id number] DOB: 06-05-1932 DOA: 01/28/2013  PCP: Provider Not In System  Admit date: 01/28/2013 Discharge date: 02/05/2013  Recommendations for Outpatient Follow-up:  1. Discharge to skilled nursing facility to continue ongoing physical and occupational therapy 2. Follow up with Dr. Luiz Blare within 2 weeks 3.  followup with cardiology in 2 weeks to check blood pressure and heart rate   Discharge Diagnoses:  Principal Problem:   Femur fracture, left Active Problems:   Paroxysmal atrial fibrillation   Hypertension   Dementia   HTN (hypertension)   Tachycardia-bradycardia syndrome   Hypothyroidism   Microcytic anemia   Atrial fibrillation with RVR   Dementia with behavioral disturbance   Discharge Condition: Stable, improved  Diet recommendation: Dysphagia 1 diet with thin liquids  Wt Readings from Last 3 Encounters:  02/05/13 68.3 kg (150 lb 9.2 oz)  02/05/13 68.3 kg (150 lb 9.2 oz)  02/05/13 68.3 kg (150 lb 9.2 oz)    History of present illness:  Patient is a 79 year old female with severe advanced dementia, hypertension, coronary artery disease, paroxysmal atrial fibrillation on digoxin, history of tachybradycardia syndrome was transferred from skilled nursing facility after a fall today and patient has advanced dementia and is unable to provide any history. She opens her eyes to her name and states "yes" to the fall today. According to EMS report and EDP, patient is at [facility] facility and fell at 3:30 AM this morning. X-rays were done and apparently patient has a known injury to the right femur and right knee. However x-rays done today in ED shows left femur fracture as well. Orthopedics has been consulted by ED physician and hospitalist service was requested for admission.   Hospital Course:   Femur fracture, left and right s/p palliative IM retrograde femoral nailing of bilateral supracondylar femur fractures on June 4  by Dr. Jodi Geralds.  Surgery was performed due to suspicion of persistent extreme pain.  She should continued non-weight-bearing of bilateral lower extremities. She should be out of bed to chair daily and wear her bilateral knee immobilizers.  In discussion with the family, she was not started on any new medications to prevent DVTs in the setting of recent bilateral femoral fractures 2 to her previous risk of GI bleed. She has atrial fibrillation and had not been on anticoagulation for the same reason.    E. coli UTI STATUS post 5 days of ceftriaxone, foley removed.   Paroxysmal atrial fibrillation, developed RVR am 01/30/13--HR 140>>120s. Heart rate trended down to the 80s with increased digoxin. Heart rate now stable, and she has intermittent paced beats when she had a slight pause in ventricular rate.  Overnight from June 8 to June 9, she had increased heart rate to the 100s.  Her heart rate came down spontaneously with pain control. Her blood pressures have been low normal.  At this time we'll not make any new adjustments to her medications, however she will need close followup as an outpatient to monitor her heart rate and blood pressure.    Poor urine output, occurred after surgery likely due to blood loss and improved with IV fluids. Her BUN and creatinine trended down.    Hypothyroidism, TSH 4.216.  She continued her Synthroid and should have a repeat TSH in 4-6 weeks  Dementia- severe stable. She required mitts as needed to prevent her from pulling out her peripheral IV.    Hypernatremia developed postoperatively. She was started on dextrose water infusion and had  full recovery to normal sodium.  She appears euvolemic.  Hyperglycemia, likely due to dextrose fluids and resolved after discontinuing her dextrose infusion.  A hemoglobin A1c was sent and is pending at the time of discharge.  Microcytic anemia, ferritin normal, but low iron and iron saturations. Transferrin was not sent.  She likely  has blood loss anemia secondary to her recent surgery. She was started on iron and should have a repeat CBC/anemia panel in one month.   Consultants:  Orthopedics, Dr. Jodi Geralds Procedures:  ORIF- b/l femurs June 4th by Dr. Jodi Geralds  Antibiotics:  Rocephin 6/2 > 6/7  Ancef- peri-op  Discharge Exam: Filed Vitals:   02/05/13 0937  BP: 119/80  Pulse: 82  Temp: 99.7 F (37.6 C)  Resp:    Filed Vitals:   02/04/13 1420 02/04/13 2121 02/05/13 0434 02/05/13 0937  BP: 103/67 105/52 137/67 119/80  Pulse: 86 79 110 82  Temp:  97.6 F (36.4 C) 97.5 F (36.4 C) 99.7 F (37.6 C)  TempSrc:  Oral Axillary Axillary  Resp:  19 20   Height:      Weight:   68.3 kg (150 lb 9.2 oz)   SpO2:  91% 100%    The patient's answers yes to all of my questions even of the questions conflict one another.  She frequently moans or groans and responds to questions.  General: No acute respiratory distress  Lungs:  CTAB anteriorly and axillary, no increased work of breathing  Cardiovascular: Irregular rate and rhythm without murmur gallop or rub normal S1 and S2  Abdomen: Nontender, nondistended, soft, bowel sounds positive, no rebound, no ascites, no appreciable mass  Extremities: No significant cyanosis, clubbing- b/l knees in brace edema bilateral lower extremities. 2+ pedal pulses, warm extremities, less than 2 second capillary refill.  Neuro: Patient able to wiggle toes on bilateral lower extremities   Discharge Instructions      Discharge Orders   Future Appointments Provider Department Dept Phone   03/14/2013 10:15 AM Hillis Range, MD [facility] Main [facility] [place]) [phone]   Future Orders Complete By Expires     (HEART FAILURE PATIENTS) Call MD:  Anytime you have any of the following symptoms: 1) 3 pound weight gain in 24 hours or 5 pounds in 1 week 2) shortness of breath, with or without a dry hacking cough 3) swelling in the hands, feet or stomach 4) if you have to sleep on  extra pillows at night in order to breathe.  As directed     Call MD for:  difficulty breathing, headache or visual disturbances  As directed     Call MD for:  extreme fatigue  As directed     Call MD for:  hives  As directed     Call MD for:  persistant dizziness or light-headedness  As directed     Call MD for:  persistant nausea and vomiting  As directed     Call MD for:  redness, tenderness, or signs of infection (pain, swelling, redness, odor or green/yellow discharge around incision site)  As directed     Call MD for:  severe uncontrolled pain  As directed     Call MD for:  temperature >100.4  As directed     Diet general  As directed     Comments:      Dysphagia 1 with thin liquids    Discharge instructions  As directed     Comments:      Ms.  Laredo was hospitalized with fractures of both of her legs. She underwent surgery on June 4 by Dr. Jodi Geralds to stabilize her leg fractures and improve her pain.  She was treated for a urinary tract infection.  Additionally her heart rate was intermittently elevated so her digoxin dose was increased and she was started on diltiazem.  She will need to followup with her cardiologist within 2 weeks to ensure her blood pressure and heart rate are stable.    Non weight bearing  As directed     Scheduling Instructions:      Bilateral lower extremities    Remove dressing in 48 hours  As directed         Medication List    TAKE these medications       acetaminophen 325 MG tablet  Commonly known as:  TYLENOL  Take 650 mg by mouth every 6 (six) hours as needed.     antiseptic oral rinse Liqd  15 mLs by Mouth Rinse route 2 times daily at 12 noon and 4 pm.     bisacodyl 10 MG suppository  Commonly known as:  DULCOLAX  Place 1 suppository (10 mg total) rectally daily as needed.     clonazePAM 0.5 MG tablet  Commonly known as:  KLONOPIN  Take 1 tablet (0.5 mg total) by mouth every 6 (six) hours as needed for anxiety.     digoxin 0.125 MG  tablet  Commonly known as:  LANOXIN  Take 1 tablet (0.125 mg total) by mouth daily.     diltiazem 30 MG tablet  Commonly known as:  CARDIZEM  Take 1 tablet (30 mg total) by mouth every 6 (six) hours.     DSS 100 MG Caps  Take 100 mg by mouth 2 (two) times daily.     feeding supplement Liqd  Take 237 mLs by mouth 2 (two) times daily between meals.     flintstones complete 60 MG chewable tablet  Chew 1 tablet by mouth daily.     furosemide 20 MG tablet  Commonly known as:  LASIX  Take 1 tablet (20 mg total) by mouth daily as needed (SOB, increasing lower extremity swelling, more than 3-lbs of weight gain in 1 day or 5-lbs in 1 week). For leg swelling     HYDROcodone-acetaminophen 5-325 MG per tablet  Commonly known as:  NORCO/VICODIN  Take 1-2 tablets by mouth every 6 (six) hours as needed for pain.     levothyroxine 25 MCG tablet  Commonly known as:  SYNTHROID, LEVOTHROID  Take 25 mcg by mouth daily before breakfast.     polyethylene glycol packet  Commonly known as:  MIRALAX / GLYCOLAX  Take 17 g by mouth daily as needed.       Follow-up Information   Follow up with GRAVES,JOHN L, MD. Schedule an appointment as soon as possible for a visit in 2 weeks.   Contact information:   [address] [place] [address] [phone]       Follow up with [facility]). Schedule an appointment as soon as possible for a visit in 2 weeks.   Contact information:   [address] [place] [address] [phone]       The results of significant diagnostics from this hospitalization (including imaging, microbiology, ancillary and laboratory) are listed below for reference.    Significant Diagnostic Studies: Dg Chest 1 View  01/28/2013   *RADIOLOGY REPORT*  Clinical Data: Bilateral thigh  pain status post fall today.  CHEST - 1 VIEW  Comparison: 11/27/2011 and 06/17/2010.  Findings: Right subclavian pacemaker leads appear unchanged within  the right atrium and ventricle.  The heart size and mediastinal contours are stable.  There is a questionable new nodular density in the left suprahilar region adjacent to the aortic arch.  This could be related to the first costochondral junction and different positioning.  The lungs are otherwise clear.  There is no pleural effusion or pneumothorax.  Osteophytes are noted throughout the thoracic spine.  IMPRESSION:  1.  No acute findings identified. 2.  Cannot exclude a developing nodule in the left suprahilar region.  This could be due to the costochondral junction and positional differences.  Attention to this area on follow-up recommended.   Original Report Authenticated By: Carey Bullocks, M.D.   Dg Pelvis 1-2 Views  01/28/2013   *RADIOLOGY REPORT*  Clinical Data: Fall and hip injury  PELVIS - 1-2 VIEW  Comparison: 05/24/2009  Findings: Thoracic spine incompletely imaged, with suggestion of central possible endplate compression deformities, not further evaluated on this exam, unchanged.  Moderate stool volume projecting over descending and sigmoid colon.  No displaced pelvic fracture identified.  IMPRESSION: No displaced pelvic fracture.  Stable appearance to the inferior lumbar spine with suggestion of central endplate compression deformities again noted.   Original Report Authenticated By: Christiana Pellant, M.D.   Dg Femur Left  01/31/2013   *RADIOLOGY REPORT*  Clinical Data: ORIF.  Bilateral femur fractures.  DG C-ARM 61-120 MIN,RIGHT FEMUR - 2 VIEW,LEFT FEMUR - 2 VIEW  Technique: Five intraoperative fluoroscopic spot films.  Comparison:  Radiographs 06/01.  Findings: Left femur retrograde nail is present with proximal and distal interlocking screws.  There is also a right femoral retrograde nail with proximal and distal interlocking screws.  IMPRESSION: Bilateral retrograde femoral nails.   Original Report Authenticated By: Andreas Newport, M.D.   Dg Femur Left  01/28/2013   *RADIOLOGY REPORT*  Clinical  Data: Fall, leg pain  LEFT FEMUR - 2 VIEW  Comparison: Pelvic radiograph same date, prior exam 05/24/2009  Findings: Mild left hip degenerative change is identified. Bones are osteopenic, which may mask subtle fracture.  Moderate stool volume noted over the sigmoid and descending colon.  Lumbar spine central compression deformities re-identified.  Vascular calcifications noted. There is a comminuted oblique distal left femoral fracture with approximately one half shaft width overlap of the fracture fragments.  Tricompartmental degenerative changes are noted in the knee.  Mild varus angulation at the fracture site. Prepatellar soft tissue swelling is identified.  IMPRESSION: A comminuted oblique distal left femoral fracture without apparent intra-articular extension.   Original Report Authenticated By: Christiana Pellant, M.D.   Dg Femur Right  01/31/2013   *RADIOLOGY REPORT*  Clinical Data: ORIF.  Bilateral femur fractures.  DG C-ARM 61-120 MIN,RIGHT FEMUR - 2 VIEW,LEFT FEMUR - 2 VIEW  Technique: Five intraoperative fluoroscopic spot films.  Comparison:  Radiographs 06/01.  Findings: Left femur retrograde nail is present with proximal and distal interlocking screws.  There is also a right femoral retrograde nail with proximal and distal interlocking screws.  IMPRESSION: Bilateral retrograde femoral nails.   Original Report Authenticated By: Andreas Newport, M.D.   Dg Femur Right  01/28/2013   *RADIOLOGY REPORT*  Clinical Data: Fall today with bilateral femur pain.  RIGHT FEMUR - 2 VIEW  Comparison: None.  Findings: Mild to moderate osteopenia.  Extensive artifact , presumably due to clothing.  A markedly comminuted and angulated  fracture of the distal femur, suboptimally evaluated. Intra- articular extension.  The more proximal and mid femoral shaft demonstrates no fracture.  Suboptimal evaluation of the proximal tibia and fibula.  IMPRESSION: Moderately degraded secondary to artifact distally.  Comminuted  intra-articular distal femur fracture.  Recommend dedicated knee films.  The more proximal femur demonstrates no acute finding.   Original Report Authenticated By: Jeronimo Greaves, M.D.   Dg Tibia/fibula Left  01/28/2013   *RADIOLOGY REPORT*  Clinical Data: Fall today.  Lower leg bruising.  LEFT TIBIA AND FIBULA - 2 VIEW  Comparison: None.  Findings: The knees are excluded from this examination and reported separately.  The bones are diffusely demineralized.  There is no evidence of acute fracture or dislocation in the lower leg. Vascular calcifications are noted.  There is joint space loss within the hind foot.  Scattered vascular calcifications are noted.  IMPRESSION: No evidence of left lower leg acute fracture or dislocation. Distal femoral/knee findings are dictated separately.   Original Report Authenticated By: Carey Bullocks, M.D.   Dg Tibia/fibula Right  01/28/2013   *RADIOLOGY REPORT*  Clinical Data: Fall today.  Lower extremity bruising.  RIGHT TIBIA AND FIBULA - 2 VIEW  Comparison: None.  Findings: The bones appears severely demineralized.  No acute fracture or dislocation of the mid to distal lower legs is demonstrated.  The knees are excluded from these images, and discussed on the separate examination of the right femur. Scattered vascular calcifications are noted.  The positioning is suboptimal for evaluation of the ankle.  Joint space loss is noted throughout the hind foot.  IMPRESSION: No acute findings are identified within the lower leg.  Distal femoral/knee findings are reported separately.   Original Report Authenticated By: Carey Bullocks, M.D.   Ct Head Wo Contrast  01/28/2013   *RADIOLOGY REPORT*  Clinical Data:  Status post fall.  Hip injury.  Altered mental status.  History of dementia.  CT HEAD WITHOUT CONTRAST CT CERVICAL SPINE WITHOUT CONTRAST  Technique:  Multidetector CT imaging of the head and cervical spine was performed following the standard protocol without intravenous  contrast.  Multiplanar CT image reconstructions of the cervical spine were also generated.  Comparison:  Head CT 11/27/2011.  Cervical spine CT 08/05/2011.  CT HEAD  Findings: There is progressive atrophy with diffuse prominence of the ventricles and subarachnoid spaces.  Confluent low density in the periventricular white matter also appears mildly progressive. However, there is no evidence of acute intracranial hemorrhage, mass lesion, brain edema or extra-axial fluid collection.  There is no evidence of acute infarct.  The visualized paranasal sinuses are clear.  The calvarium is intact.  IMPRESSION:  1.  Progressive atrophy and chronic small vessel ischemic changes. 2.  No acute intracranial findings identified.  CT CERVICAL SPINE  Findings: The cervical alignment appears similar.  There is a retrolisthesis at C3-C4 associated with occult spinal dysraphism at C3. This results in chronic central and biforaminal stenosis. There is a mild anterolisthesis at C5-C6.  There is multilevel spondylosis.  No acute fracture or traumatic subluxation is identified.  No acute soft tissue findings are evident.  Carotid arterial calcifications are noted bilaterally.  IMPRESSION: No evidence of acute cervical spine fracture, traumatic subluxation or static signs of instability.  Stable alignment and multilevel spondylosis contributing to spinal stenosis at C3-C4.   Original Report Authenticated By: Carey Bullocks, M.D.   Ct Cervical Spine Wo Contrast  01/28/2013   *RADIOLOGY REPORT*  Clinical Data:  Status post fall.  Hip injury.  Altered mental status.  History of dementia.  CT HEAD WITHOUT CONTRAST CT CERVICAL SPINE WITHOUT CONTRAST  Technique:  Multidetector CT imaging of the head and cervical spine was performed following the standard protocol without intravenous contrast.  Multiplanar CT image reconstructions of the cervical spine were also generated.  Comparison:  Head CT 11/27/2011.  Cervical spine CT 08/05/2011.  CT  HEAD  Findings: There is progressive atrophy with diffuse prominence of the ventricles and subarachnoid spaces.  Confluent low density in the periventricular white matter also appears mildly progressive. However, there is no evidence of acute intracranial hemorrhage, mass lesion, brain edema or extra-axial fluid collection.  There is no evidence of acute infarct.  The visualized paranasal sinuses are clear.  The calvarium is intact.  IMPRESSION:  1.  Progressive atrophy and chronic small vessel ischemic changes. 2.  No acute intracranial findings identified.  CT CERVICAL SPINE  Findings: The cervical alignment appears similar.  There is a retrolisthesis at C3-C4 associated with occult spinal dysraphism at C3. This results in chronic central and biforaminal stenosis. There is a mild anterolisthesis at C5-C6.  There is multilevel spondylosis.  No acute fracture or traumatic subluxation is identified.  No acute soft tissue findings are evident.  Carotid arterial calcifications are noted bilaterally.  IMPRESSION: No evidence of acute cervical spine fracture, traumatic subluxation or static signs of instability.  Stable alignment and multilevel spondylosis contributing to spinal stenosis at C3-C4.   Original Report Authenticated By: Carey Bullocks, M.D.   Dg Chest Port 1 View  02/01/2013   *RADIOLOGY REPORT*  Clinical Data: Unable to hear the patient's lungs sounds due to her combativeness.  Given fluid.  PORTABLE CHEST - 1 VIEW  Comparison: 01/28/2013.  Findings: Interval mild cardiomegaly and increased prominence of the pulmonary vasculature and interstitial markings, including some Kerley lines.  Stable right subclavian pacemaker leads.  Thoracic spine degenerative changes.  Diffuse osteopenia.  IMPRESSION: Interval mild cardiomegaly and mild changes of congestive heart failure.   Original Report Authenticated By: Beckie Salts, M.D.   Dg C-arm [id number] Min  01/31/2013   *RADIOLOGY REPORT*  Clinical Data: ORIF.   Bilateral femur fractures.  DG C-ARM 61-120 MIN,RIGHT FEMUR - 2 VIEW,LEFT FEMUR - 2 VIEW  Technique: Five intraoperative fluoroscopic spot films.  Comparison:  Radiographs 06/01.  Findings: Left femur retrograde nail is present with proximal and distal interlocking screws.  There is also a right femoral retrograde nail with proximal and distal interlocking screws.  IMPRESSION: Bilateral retrograde femoral nails.   Original Report Authenticated By: Andreas Newport, M.D.    Microbiology: Recent Results (from the past 240 hour(s))  SURGICAL PCR SCREEN     Status: Abnormal   Collection Time    01/28/13  8:32 PM      Result Value Range Status   MRSA, PCR NEGATIVE  NEGATIVE Final   Staphylococcus aureus POSITIVE (*) NEGATIVE Final   Comment:            The Xpert SA Assay (FDA     approved for NASAL specimens     in patients over 71 years of age),     is one component of     a comprehensive surveillance     program.  Test performance has     been validated by [employer] for patients greater     than or equal to 36 year old.     It is not intended  to diagnose infection nor to     guide or monitor treatment.     RESULT CALLED TO, READ BACK BY AND VERIFIED WITH:     A.MOORE,RN [id number] 01/28/13 M.CAMPBELL  URINE CULTURE     Status: None   Collection Time    01/28/13  9:42 PM      Result Value Range Status   Specimen Description URINE, CATHETERIZED   Final   Special Requests NONE   Final   Culture  Setup Time 01/29/2013 02:37   Final   Colony Count >=100,000 COLONIES/ML   Final   Culture ESCHERICHIA COLI   Final   Report Status 01/30/2013 FINAL   Final   Organism ID, Bacteria ESCHERICHIA COLI   Final     Labs: Basic Metabolic Panel:  Recent Labs Lab 01/30/13 0540  02/01/13 0440 02/02/13 0520 02/03/13 0525 02/04/13 0500 02/05/13 0530  NA 138  < > 145 148* 147* 133* 141  K 4.1  < > 4.2 4.1 3.8 3.7 4.6  CL 104  < > 113* 118* 117* 104 108  CO2 22  < > 24 21 25 25 25    GLUCOSE 114*  < > 112* 100* 125* 356* 129*  BUN 36*  < > 35* 34* 30* 22 20  CREATININE 0.51  < > 0.43* 0.45* 0.41* 0.33* 0.35*  CALCIUM 8.6  < > 8.0* 8.2* 8.2* 7.4* 7.9*  MG 2.2  --   --   --   --   --   --   < > = values in this interval not displayed. Liver Function Tests: No results found for this basename: AST, ALT, ALKPHOS, BILITOT, PROT, ALBUMIN,  in the last 168 hours No results found for this basename: LIPASE, AMYLASE,  in the last 168 hours No results found for this basename: AMMONIA,  in the last 168 hours CBC:  Recent Labs Lab 01/30/13 0835 02/01/13 0440 02/01/13 1610 02/02/13 0520 02/03/13 0525  WBC 9.8 9.6 11.2* 8.8 10.9*  HGB 8.2* 9.3* 9.2* 8.6* 8.2*  HCT 24.7* 28.9* 28.6* 27.0* 25.2*  MCV 82.1 84.5 85.4 86.8 85.4  PLT 288 248 257 257 291   Cardiac Enzymes: No results found for this basename: CKTOTAL, CKMB, CKMBINDEX, TROPONINI,  in the last 168 hours BNP: BNP (last 3 results) No results found for this basename: PROBNP,  in the last 8760 hours CBG:  Recent Labs Lab 02/04/13 1604 02/04/13 2155 02/05/13 0655  GLUCAP 113* 110* 116*    Time coordinating discharge: 46 minutes  Signed:  Maggy Wyble  [facility] 02/05/2013, 11:49 AM

## 2013-02-05 NOTE — Progress Notes (Signed)
Pt. Unable to swallow/take medicine.  Medicine sits in pt. Mouth.  Has choked and coughed up every medicine

## 2013-02-05 NOTE — Progress Notes (Signed)
Ok per MD for d/c today back to [facility]. Ok per facility and patient's nurse- Liz Beach.  Report to be called by nursing.  Patient is alert but confused.  EMS to transport patient.  No further CSW needs identified.  Lorri Frederick. [place]  [phone]

## 2013-02-05 NOTE — Progress Notes (Signed)
Patient continues to cry out intermittently. Pain medication given for relief. Heart rate tachy during turning of patient. Dressing dry and intact.

## 2013-02-14 ENCOUNTER — Ambulatory Visit (INDEPENDENT_AMBULATORY_CARE_PROVIDER_SITE_OTHER): Payer: Medicare Other | Admitting: Physician Assistant

## 2013-02-14 ENCOUNTER — Encounter: Payer: Self-pay | Admitting: Physician Assistant

## 2013-02-14 VITALS — BP 142/82 | HR 61

## 2013-02-14 DIAGNOSIS — I4891 Unspecified atrial fibrillation: Secondary | ICD-10-CM

## 2013-02-14 DIAGNOSIS — I5189 Other ill-defined heart diseases: Secondary | ICD-10-CM

## 2013-02-14 DIAGNOSIS — I10 Essential (primary) hypertension: Secondary | ICD-10-CM

## 2013-02-14 DIAGNOSIS — I519 Heart disease, unspecified: Secondary | ICD-10-CM

## 2013-02-14 NOTE — Assessment & Plan Note (Signed)
Controlled.  

## 2013-02-14 NOTE — Assessment & Plan Note (Signed)
No anticoagulation because of prior GI bleeds. Patient is DO NOT RESUSCITATE

## 2013-02-14 NOTE — Progress Notes (Signed)
No respiratory distress observed; was in pt room when meds given by student.

## 2013-02-14 NOTE — Progress Notes (Signed)
HPI:  This is a 79 year old female patient with severe advanced dementia, hypertension, coronary artery disease, and paroxysmal atrial  fibrillation  with tachy-bradycardia syndrome status post permanent pacemaker. Unfortunately she fell and had bilateral femoral fracture which required surgery by Dr. Luiz Blare June 2014. She was not started on any  medication to prevent DVT's because of her previous GI bleeds. She is DO NOT RESUSCITATE. While in the hospital she did develop some rapid atrial fibrillation which resolved with pain control. We had trouble getting her blood pressure in the office today because of her shakiness the blood pressure before she came in was stable at 142/82. Currently she is unresponsive and cannot give any history.   Allergies: -- Aspirin -- Other (See Comments)   --  Ulcer - bleeding  -- Eggs Or Egg-Derived Products   -- Sulfonamide Derivatives   Current Outpatient Prescriptions on File Prior to Visit: acetaminophen (TYLENOL) 325 MG tablet, Take 650 mg by mouth every 6 (six) hours as needed., Disp: , Rfl:  antiseptic oral rinse (BIOTENE) LIQD, 15 mLs by Mouth Rinse route 2 times daily at 12 noon and 4 pm., Disp: , Rfl:  bisacodyl (DULCOLAX) 10 MG suppository, Place 1 suppository (10 mg total) rectally daily as needed., Disp: 12 suppository, Rfl: 0 clonazePAM (KLONOPIN) 0.5 MG tablet, Take 1 tablet (0.5 mg total) by mouth every 6 (six) hours as needed for anxiety., Disp: 10 tablet, Rfl: 0 digoxin (LANOXIN) 0.125 MG tablet, Take 1 tablet (0.125 mg total) by mouth daily., Disp: 30 tablet, Rfl: 0 diltiazem (CARDIZEM) 30 MG tablet, Take 1 tablet (30 mg total) by mouth every 6 (six) hours., Disp: 120 tablet, Rfl: 0 docusate sodium 100 MG CAPS, Take 100 mg by mouth 2 (two) times daily., Disp: 10 capsule, Rfl: 0 feeding supplement (ENSURE COMPLETE) LIQD, Take 237 mLs by mouth 2 (two) times daily between meals., Disp: , Rfl:  flintstones complete (FLINTSTONES) 60 MG chewable tablet,  Chew 1 tablet by mouth daily., Disp: , Rfl:  furosemide (LASIX) 20 MG tablet, Take 1 tablet (20 mg total) by mouth daily as needed (SOB, increasing lower extremity swelling, more than 3-lbs of weight gain in 1 day or 5-lbs in 1 week). For leg swelling, Disp: 30 tablet, Rfl: 0 HYDROcodone-acetaminophen (NORCO/VICODIN) 5-325 MG per tablet, Take 1-2 tablets by mouth every 6 (six) hours as needed for pain., Disp: 30 tablet, Rfl: 0 levothyroxine (SYNTHROID, LEVOTHROID) 25 MCG tablet, Take 25 mcg by mouth daily before breakfast., Disp: , Rfl:  polyethylene glycol (MIRALAX / GLYCOLAX) packet, Take 17 g by mouth daily as needed., Disp: 14 each, Rfl: 0  No current facility-administered medications on file prior to visit.   Past Medical History:   Dementia                                                       Comment:advanced   HTN (hypertension)                                           Atrial fibrillation  Comment:paroxysmal   Tachycardia-bradycardia syndrome                               Comment:s/p PPM by Dr Deborah Chalk   GI bleed                                        2/12           Comment:ulcer to ASA   Diastolic dysfunction                                        CHF (congestive heart failure)                               Pacemaker                                                    Coronary artery disease                                     Past Surgical History:   PACEMAKER INSERTION                              09/02/09         Comment:by Dr Deborah Chalk due to tachycardia bradycardia               syndrome and syncope (MDT)   FEMUR IM NAIL                                   Bilateral 01/31/2013       Comment:Procedure: INTRAMEDULLARY (IM) RETROGRADE               FEMORAL NAILING;  Surgeon: Harvie Junior, MD;                Location: [facility] OR;  Service: Orthopedics;                Laterality: Bilateral;  No family history on file.   Social History    Marital Status: Widowed             Spouse Name:                      Years of Education:                 Number of children:             Occupational History   None on file  Social History Main Topics   Smoking Status: Never Smoker                     Smokeless Status: Not on file                      Alcohol Use: No  Drug Use: No             Sexual Activity: Not on file        Other Topics            Concern   None on file  Social History Narrative   None on file    ROS: Unobtainable due to dementia   PHYSICAL EXAM: Well-nournished, in no acute distress. Neck: No JVD, HJR, Bruit, or thyroid enlargement  Lungs:Decreased breath sounds, No tachypnea, clear without wheezing, rales, or rhonchi  Cardiovascular: RRR, PMI not displaced, heart sounds normal, no murmurs, gallops, bruit, thrill, or heave.  Abdomen: BS normal. Soft without organomegaly, masses, lesions or tenderness.  Extremities: bilateral lower extremities in braces ,without cyanosis, clubbing or edema. Good distal pulses bilateral  SKin: Warm, no lesions or rashes   Musculoskeletal: No deformities  Neuro: no focal signs  BP 142/82  Pulse 61   EKG: atrial paced at 61 beats per minute

## 2013-02-14 NOTE — Assessment & Plan Note (Addendum)
Patient had elevated heart rate while in the hospital but heart rate was controlled once her pain was controlled. Currently she is being paced at 61 beats per minute. Will not change her medications.

## 2013-02-14 NOTE — Patient Instructions (Addendum)
The current medical regimen is effective;  continue present plan and medications.  Follow up in 4 months with Dr Hillis Range.  You will receive a letter in the mail 2 months before you are due.  Please call us when you receive this letter to schedule your follow up appointment.

## 2013-02-14 NOTE — Assessment & Plan Note (Signed)
No evidence of heart failure. 

## 2013-03-14 ENCOUNTER — Encounter: Payer: Self-pay | Admitting: Internal Medicine

## 2013-03-14 ENCOUNTER — Ambulatory Visit (INDEPENDENT_AMBULATORY_CARE_PROVIDER_SITE_OTHER): Payer: Medicare Other | Admitting: Internal Medicine

## 2013-03-14 VITALS — Ht 64.0 in

## 2013-03-14 DIAGNOSIS — I495 Sick sinus syndrome: Secondary | ICD-10-CM

## 2013-03-14 DIAGNOSIS — I4891 Unspecified atrial fibrillation: Secondary | ICD-10-CM

## 2013-03-14 LAB — PACEMAKER DEVICE OBSERVATION
AL AMPLITUDE: 2.8 mv
AL THRESHOLD: 0.5 V
ATRIAL PACING PM: 90.2
PACEART TECH NOTES PM: 1:1 {titer}
RV LEAD AMPLITUDE: 16 mv
RV LEAD IMPEDENCE PM: 541 Ohm
RV LEAD THRESHOLD: 0.875 V

## 2013-03-14 NOTE — Progress Notes (Signed)
PCP: Rudi Heap  Andrea Koch is a 79 y.o. female who presents today for routine electrophysiology followup.  Since last being seen in by me, she has fallen and with bilateral femur fractures.  She was hospitalized for a short time and had afib with RVR.  She is severely demented and is unable to answer questions or give history today.   Past Medical History  Diagnosis Date  . Dementia     advanced  . HTN (hypertension)   . Atrial fibrillation     paroxysmal  . Tachycardia-bradycardia syndrome     s/p PPM by Dr Deborah Chalk  . GI bleed 2/12    ulcer to ASA  . Diastolic dysfunction   . CHF (congestive heart failure)   . Pacemaker   . Coronary artery disease    Past Surgical History  Procedure Laterality Date  . Pacemaker insertion  09/02/09    by Dr Deborah Chalk due to tachycardia bradycardia syndrome and syncope (MDT)  . Femur im nail Bilateral 01/31/2013    Procedure: INTRAMEDULLARY (IM) RETROGRADE FEMORAL NAILING;  Surgeon: Harvie Junior, MD;  Location: [facility] OR;  Service: Orthopedics;  Laterality: Bilateral;    Current Outpatient Prescriptions  Medication Sig Dispense Refill  . acetaminophen (TYLENOL) 325 MG tablet Take 650 mg by mouth every 6 (six) hours as needed.      . bisacodyl (DULCOLAX) 10 MG suppository Place 1 suppository (10 mg total) rectally daily as needed.  12 suppository  0  . clonazePAM (KLONOPIN) 0.5 MG tablet Take 1 tablet (0.5 mg total) by mouth every 6 (six) hours as needed for anxiety.  10 tablet  0  . digoxin (LANOXIN) 0.125 MG tablet Take 1 tablet (0.125 mg total) by mouth daily.  30 tablet  0  . diltiazem (CARDIZEM) 30 MG tablet Take 1 tablet (30 mg total) by mouth every 6 (six) hours.  120 tablet  0  . docusate sodium 100 MG CAPS Take 100 mg by mouth 2 (two) times daily.  10 capsule  0  . flintstones complete (FLINTSTONES) 60 MG chewable tablet Chew 1 tablet by mouth daily.      . furosemide (LASIX) 20 MG tablet Take 1 tablet (20 mg total) by mouth daily as  needed (SOB, increasing lower extremity swelling, more than 3-lbs of weight gain in 1 day or 5-lbs in 1 week). For leg swelling  30 tablet  0  . HYDROcodone-acetaminophen (NORCO/VICODIN) 5-325 MG per tablet Take 1-2 tablets by mouth every 6 (six) hours as needed for pain.  30 tablet  0  . levothyroxine (SYNTHROID, LEVOTHROID) 25 MCG tablet Take 25 mcg by mouth daily before breakfast.      . Mouthwashes (LISTERINE ANTISEPTIC) LIQD Use as directed in the mouth or throat. Use ay noon and 4pm      . polyethylene glycol (MIRALAX / GLYCOLAX) packet Take 17 g by mouth daily as needed.  14 each  0  . tobramycin (TOBREX) 0.3 % ophthalmic solution Place 1 drop into the left eye 3 (three) times daily.       No current facility-administered medications for this visit.    Physical Exam: Filed Vitals:   03/14/13 1018  Height: 5\' 4"  (1.626 m)    GEN- The patient is ill appearing, alert on a stretcher, singing in the lobby, advanced dementia  Head- normocephalic, atraumatic Eyes-  Yellow discharge from the L eye Ears- hearing intact Oropharynx- clear Lungs- Clear to ausculation bilaterally, normal work of breathing Chest- pacemaker  pocket is well healed Heart- Regular rate and rhythm  GI- soft, NT, ND, + BS Extremities- no clubbing, cyanosis, or edema Diffuse muscle atrophy  Pacemaker interrogation- reviewed in detail today,  See PACEART report  Assessment and Plan:  1. Tachycardia/bradycardia syndrome Normal pacemaker function See Pace Art report No changes today  2. Paroxysmal atrial fibrillation Stable Not a candidate for anticoagulation  Return to the device clinic in 2 years

## 2013-03-14 NOTE — Patient Instructions (Addendum)
Your physician wants you to follow-up in: 2 years with Rick Duff, PA.  You will receive a reminder letter in the mail two months in advance. If you don't receive a letter, please call our office to schedule the follow-up appointment.

## 2013-03-16 ENCOUNTER — Encounter: Payer: Self-pay | Admitting: Internal Medicine

## 2013-09-30 DEATH — deceased

## 2014-06-27 ENCOUNTER — Telehealth: Payer: Self-pay | Admitting: Cardiology

## 2014-06-27 NOTE — Telephone Encounter (Signed)
Pt son called and stated that they received a letter informing pt that a home monitor was being ordered for her and shipped to her. He wanted to let us know that pt had passed away and that the monitor would not be needed. I informed pt son that the monitor had already been ordered and that he can keep the monitor or recycle it. He verbalized understanding.

## 2014-09-12 ENCOUNTER — Encounter (HOSPITAL_COMMUNITY): Payer: Self-pay | Admitting: Orthopedic Surgery

## 2015-04-17 IMAGING — CR DG FEMUR 2V*L*
4 series · 4 of 4 positions shown · non-contrast
Comparison: Pelvic radiograph same date, prior exam 05/24/2009

CLINICAL DATA: Fall, leg pain

LEFT FEMUR - 2 VIEW

[t femur proximal ap left]
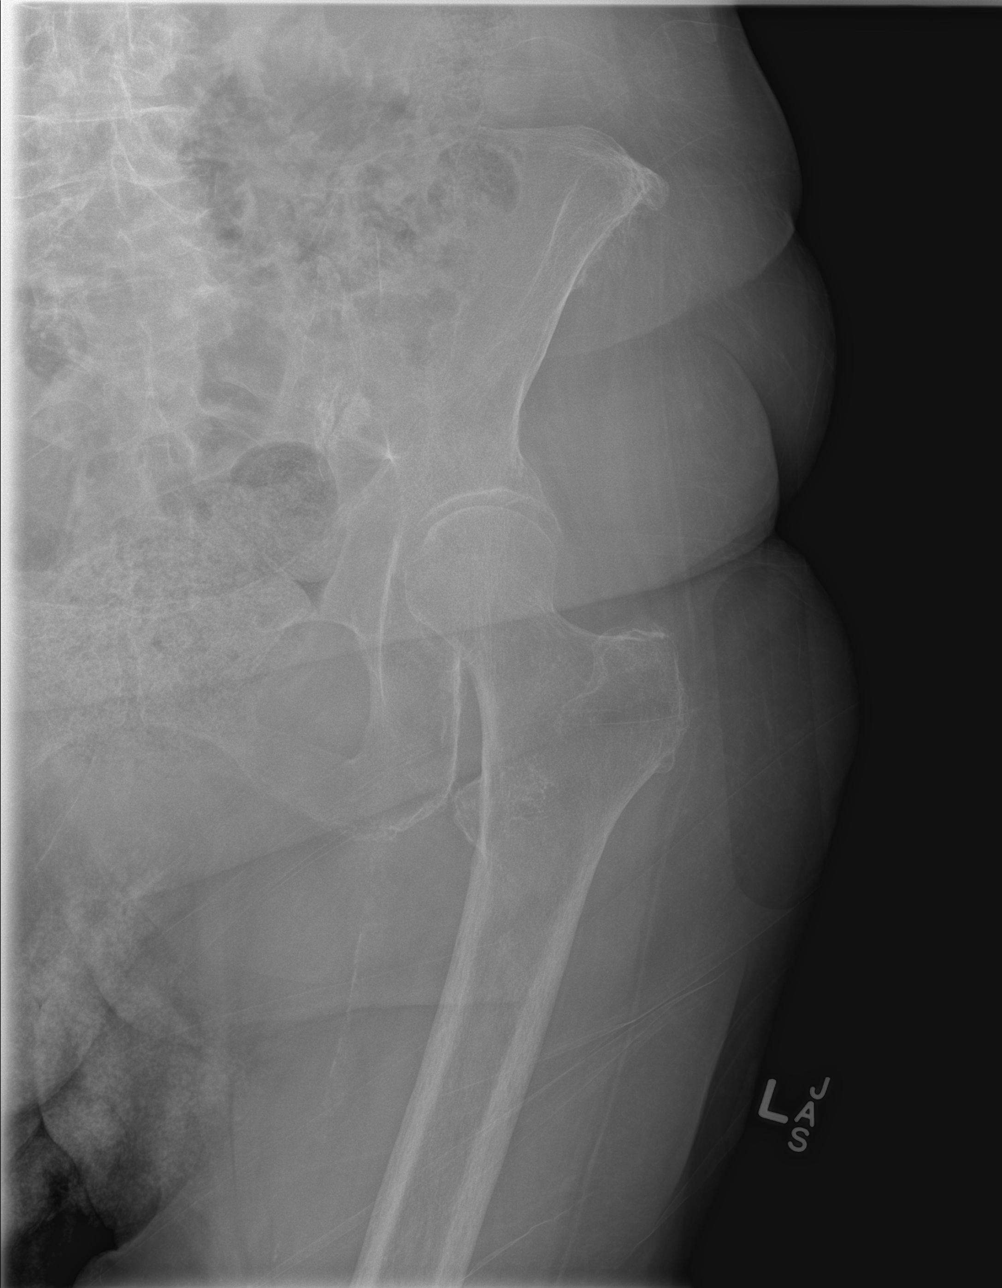

[t femur distal ap left]
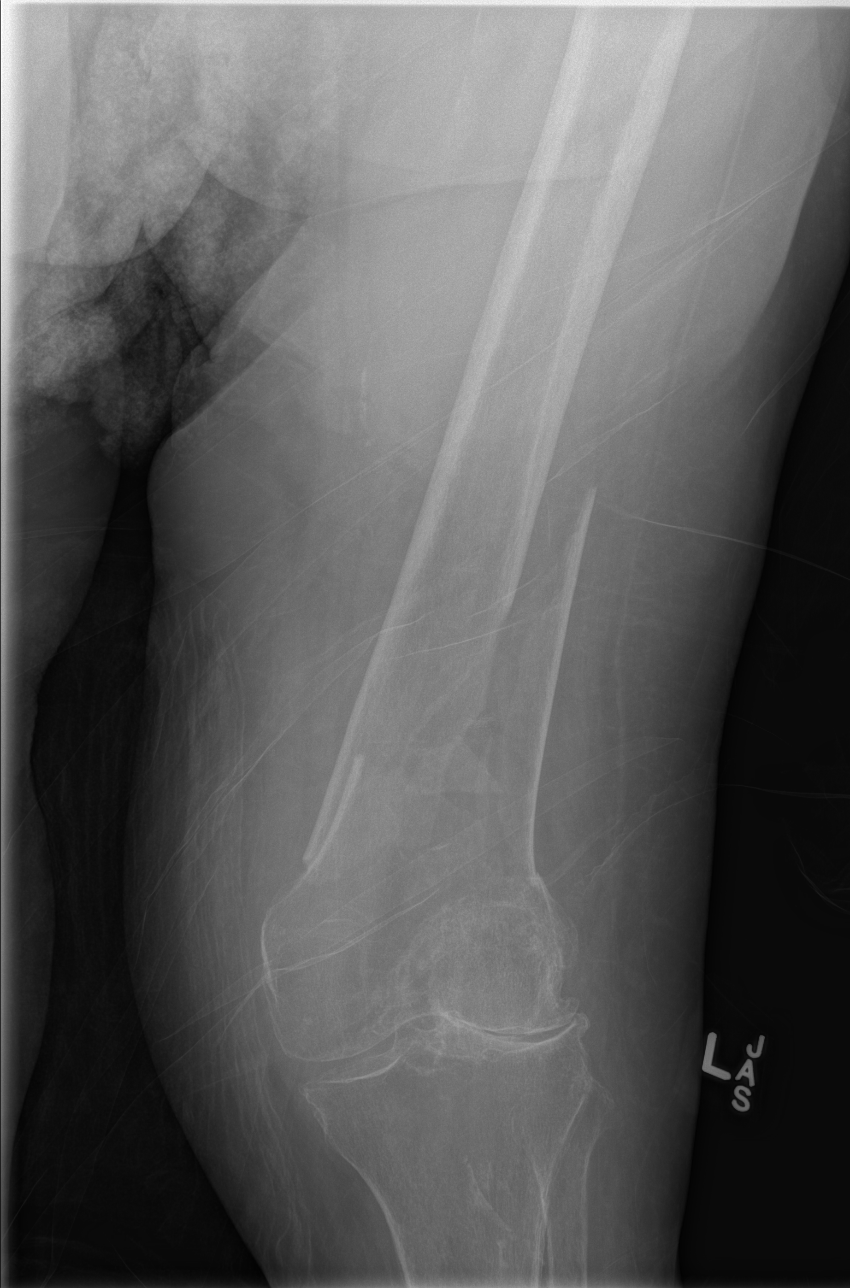

[x femur distal lat left]
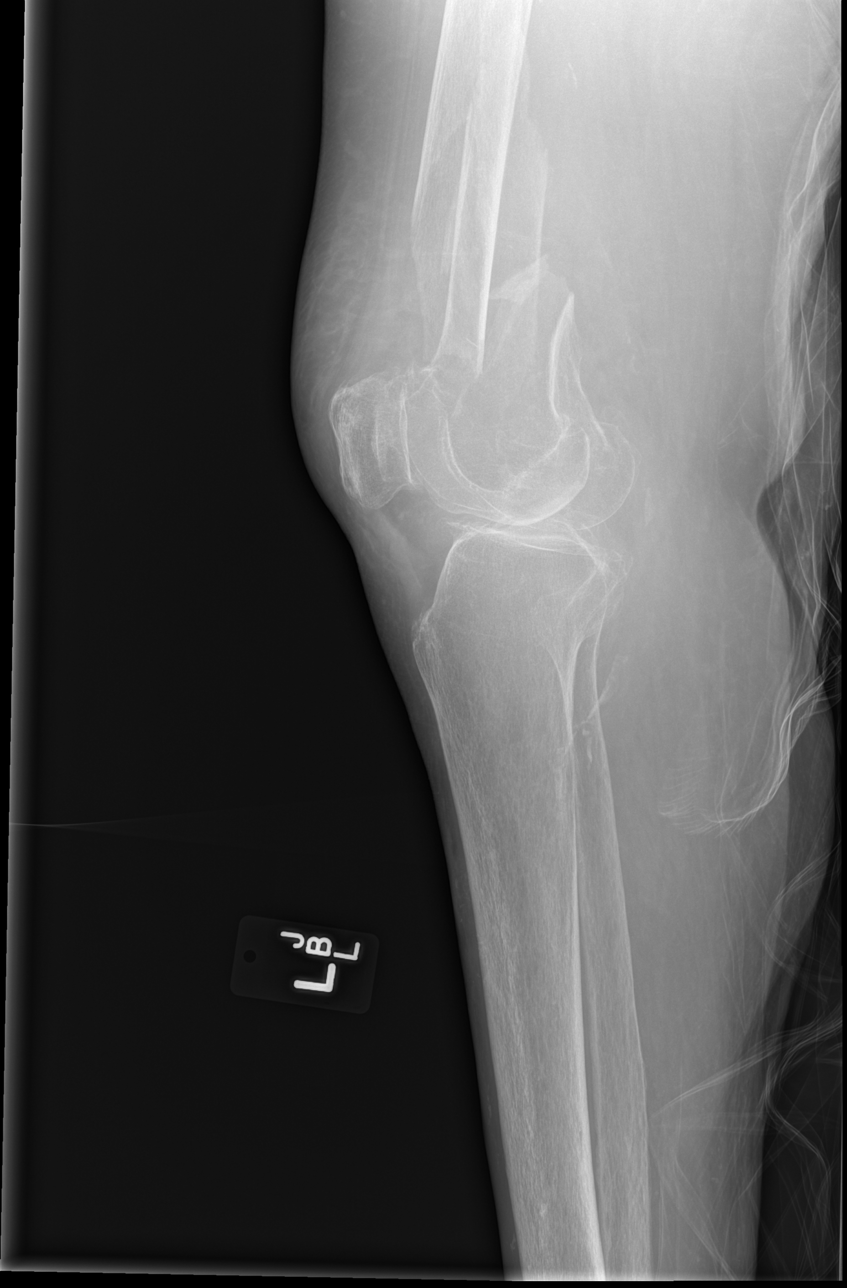

[t femur distal lat left]
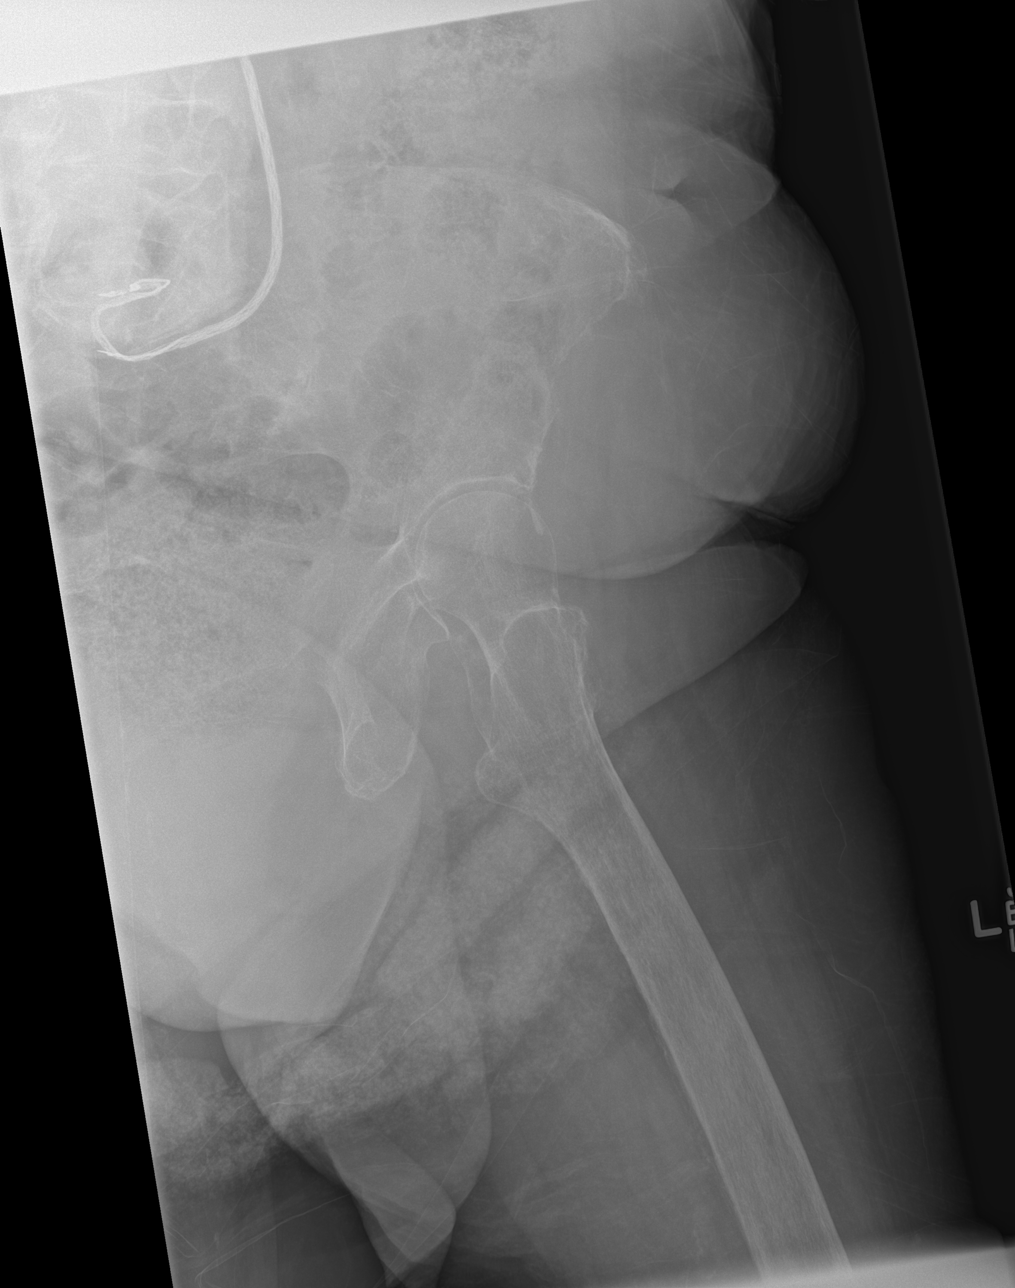

[4 of 4 positions shown; findings below may reference images not displayed]

FINDINGS: Mild left hip degenerative change is identified. Bones
are osteopenic, which may mask subtle fracture.  Moderate stool
volume noted over the sigmoid and descending colon.  Lumbar spine
central compression deformities re-identified.  Vascular
calcifications noted. There is a comminuted oblique distal left
femoral fracture with approximately one half shaft width overlap of
the fracture fragments.  Tricompartmental degenerative changes are
noted in the knee.  Mild varus angulation at the fracture site.
Prepatellar soft tissue swelling is identified.
IMPRESSION: A comminuted oblique distal left femoral fracture without apparent
intra-articular extension.

## 2015-04-17 IMAGING — CR DG PELVIS 1-2V
1 series · 1 of 1 positions shown · non-contrast
Comparison: 05/24/2009

CLINICAL DATA: Fall and hip injury

PELVIS - 1-2 VIEW

[t pelvis ap]
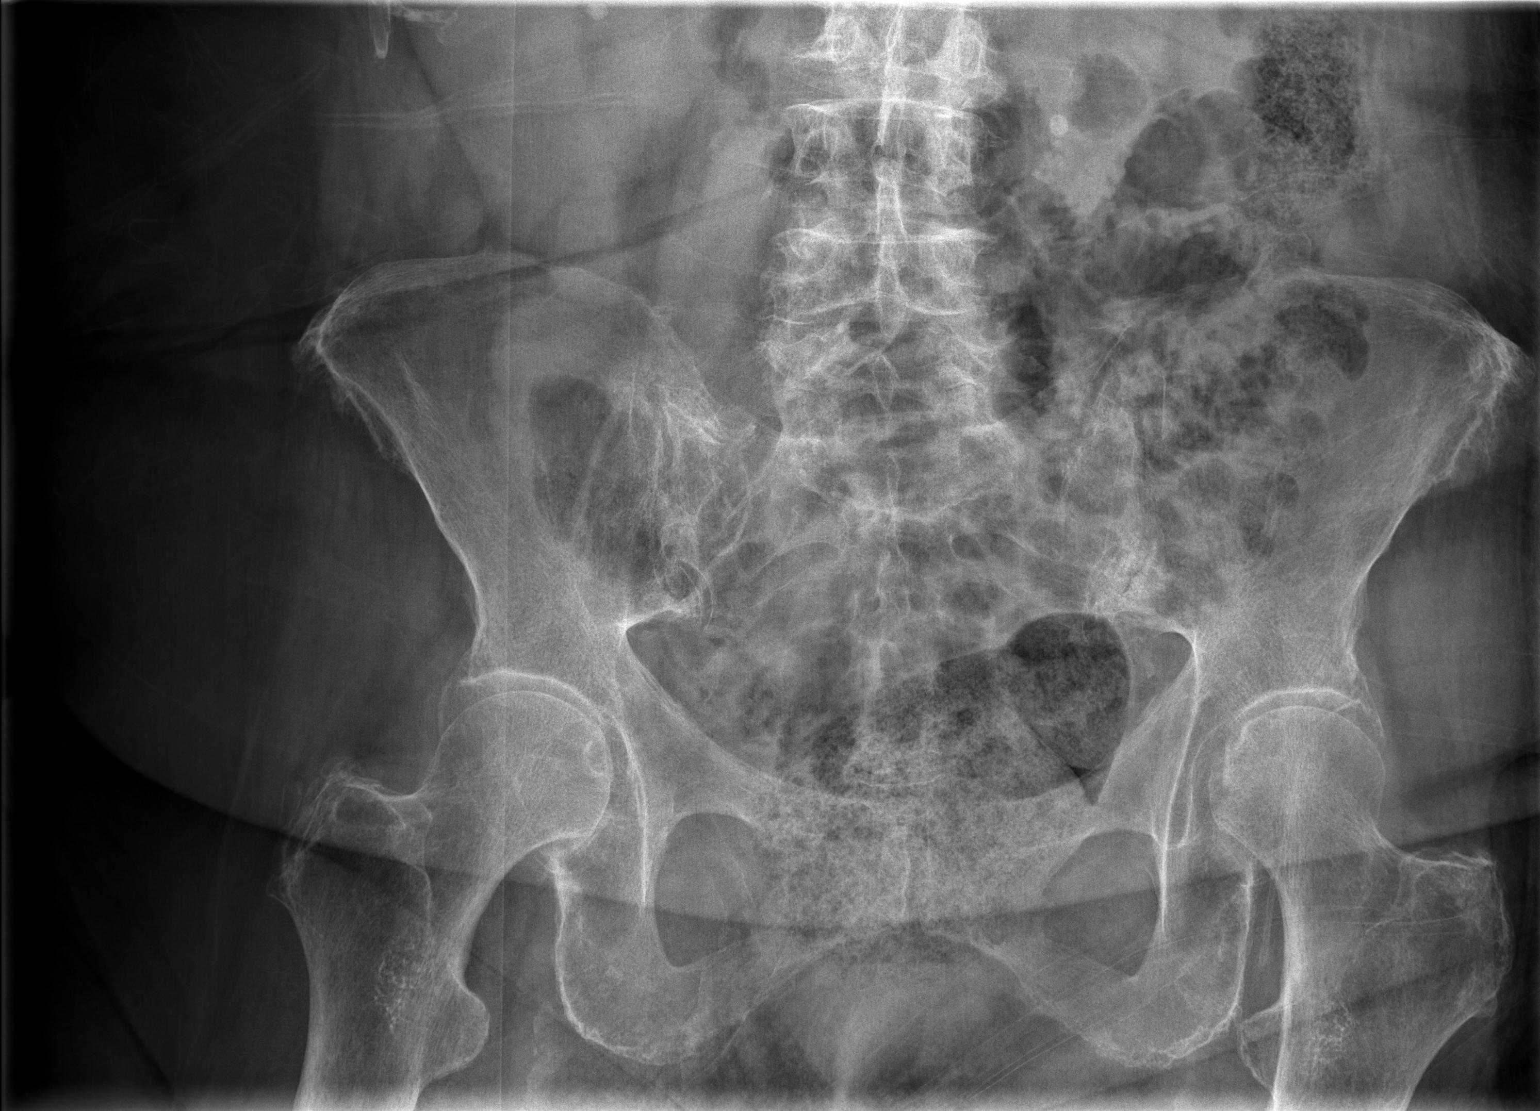

[1 of 1 positions shown; findings below may reference images not displayed]

FINDINGS: Thoracic spine incompletely imaged, with suggestion of
central possible endplate compression deformities, not further
evaluated on this exam, unchanged.  Moderate stool volume
projecting over descending and sigmoid colon.  No displaced pelvic
fracture identified.
IMPRESSION: No displaced pelvic fracture.

Stable appearance to the inferior lumbar spine with suggestion of
central endplate compression deformities again noted.

## 2015-04-20 IMAGING — RF DG FEMUR 2+V*R*
1 series · 4 of 4 positions shown · non-contrast
Comparison: Radiographs [DATE].

CLINICAL DATA: ORIF.  Bilateral femur fractures.

DG C-ARM 61-120 MIN,RIGHT FEMUR - 2 VIEW,LEFT FEMUR - 2 VIEW
TECHNIQUE: Five intraoperative fluoroscopic spot films.

[Series 1: run · 4 of 4 slices shown]
[im 1/4]
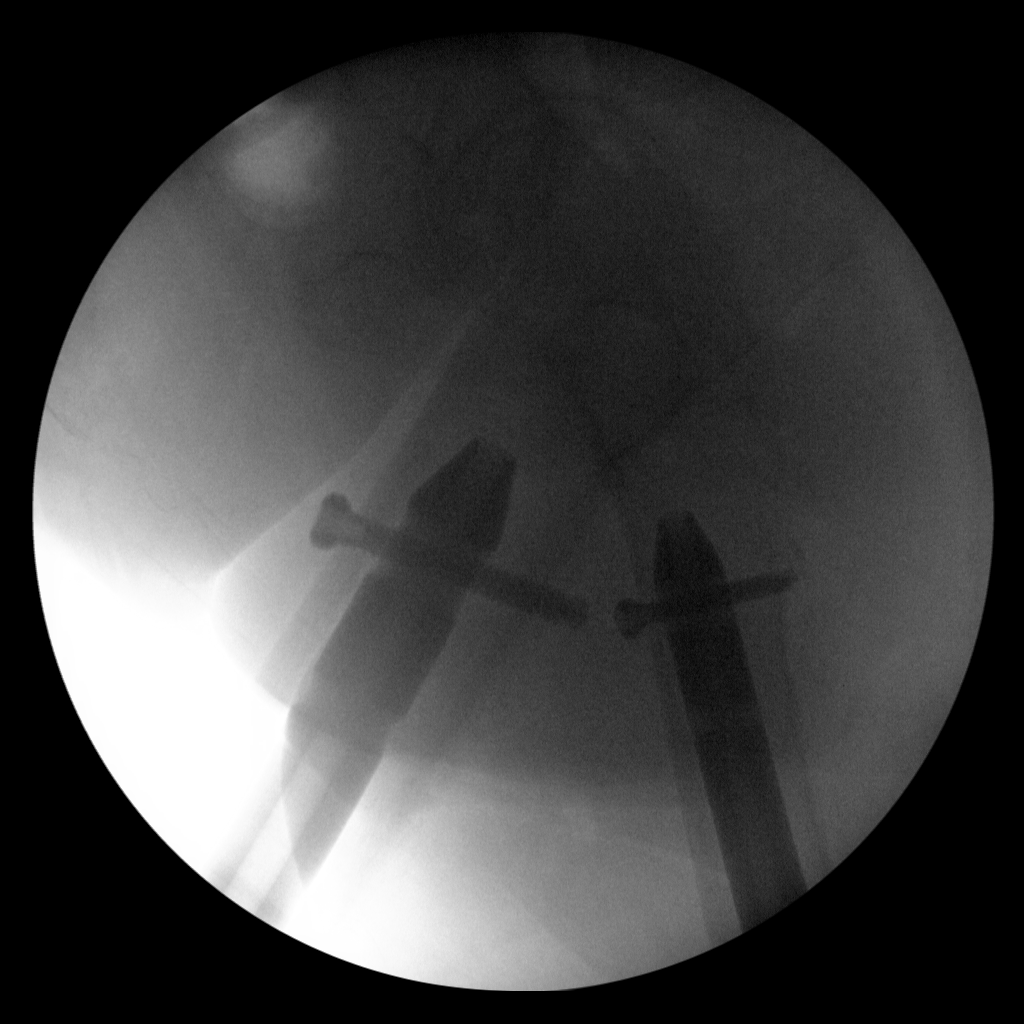
[im 2/4]
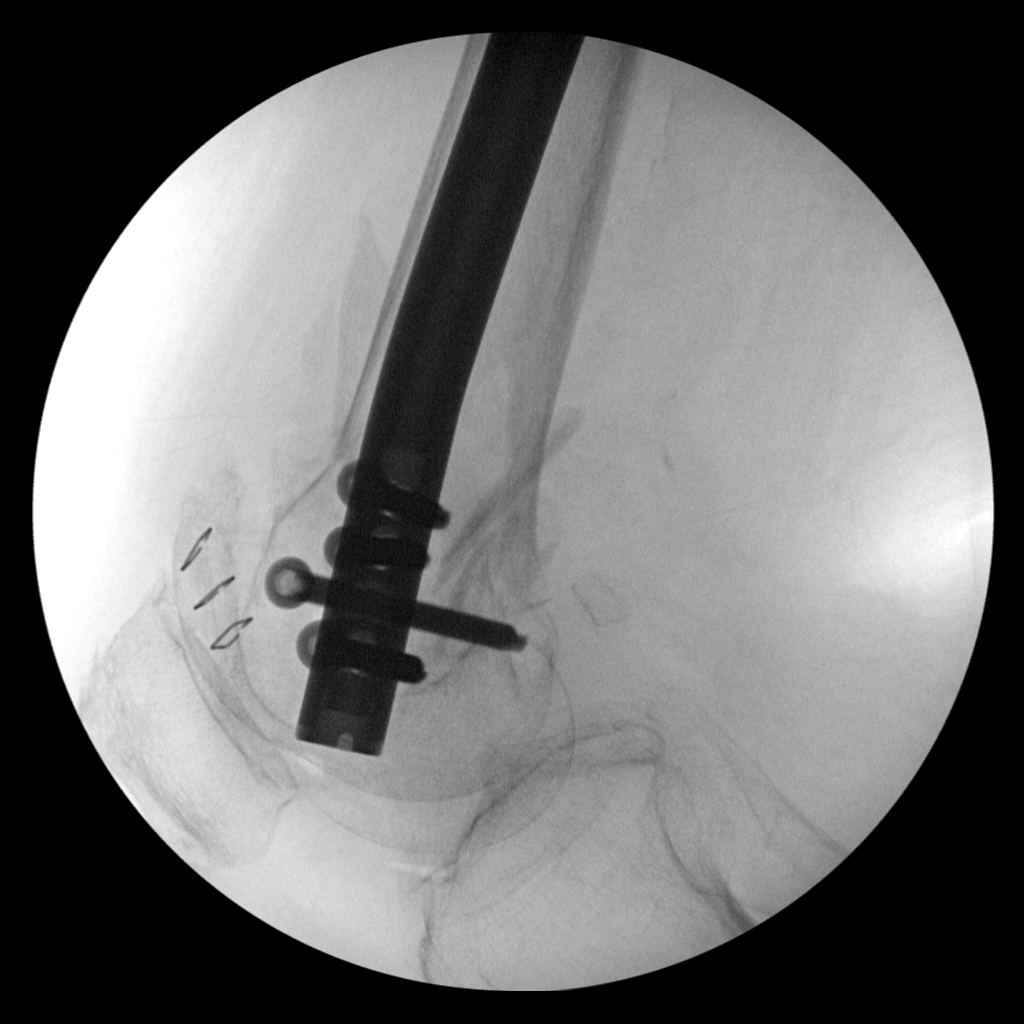
[im 3/4]
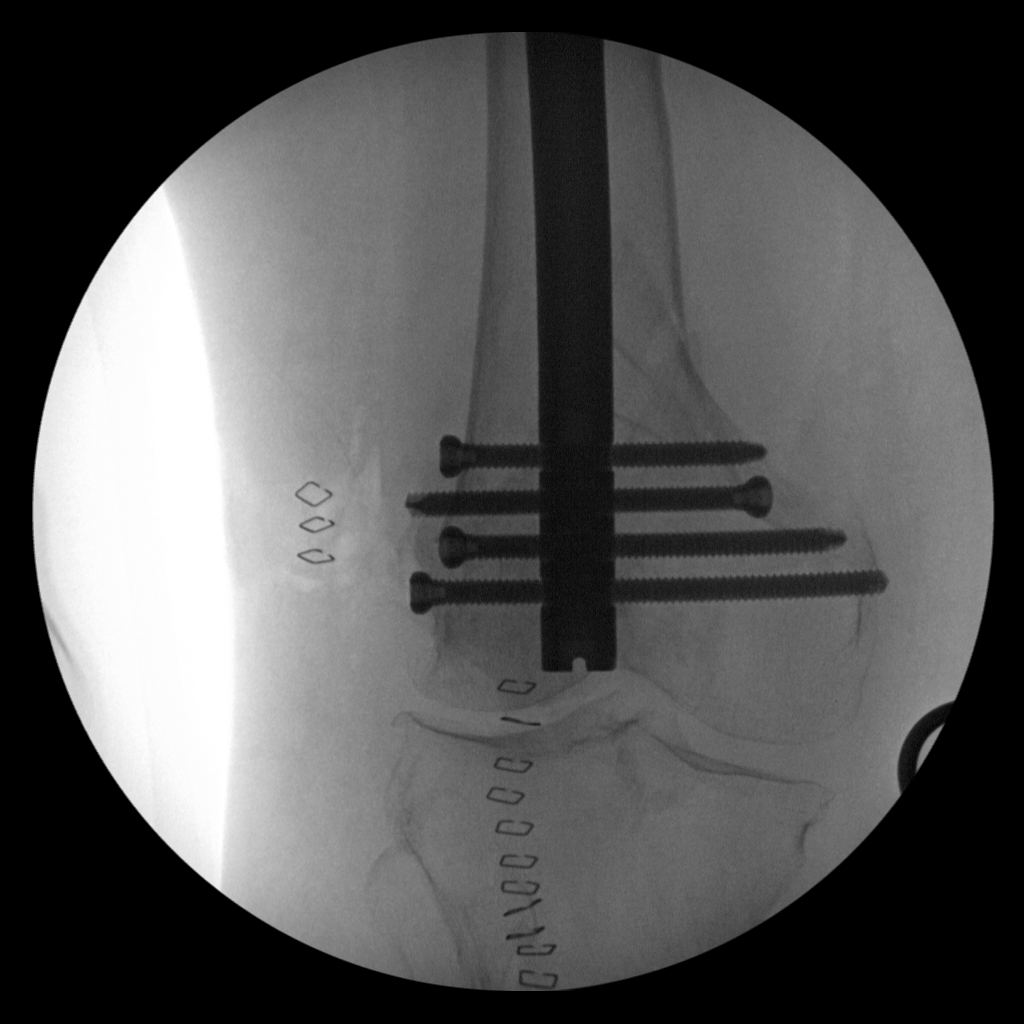
[im 4/4]
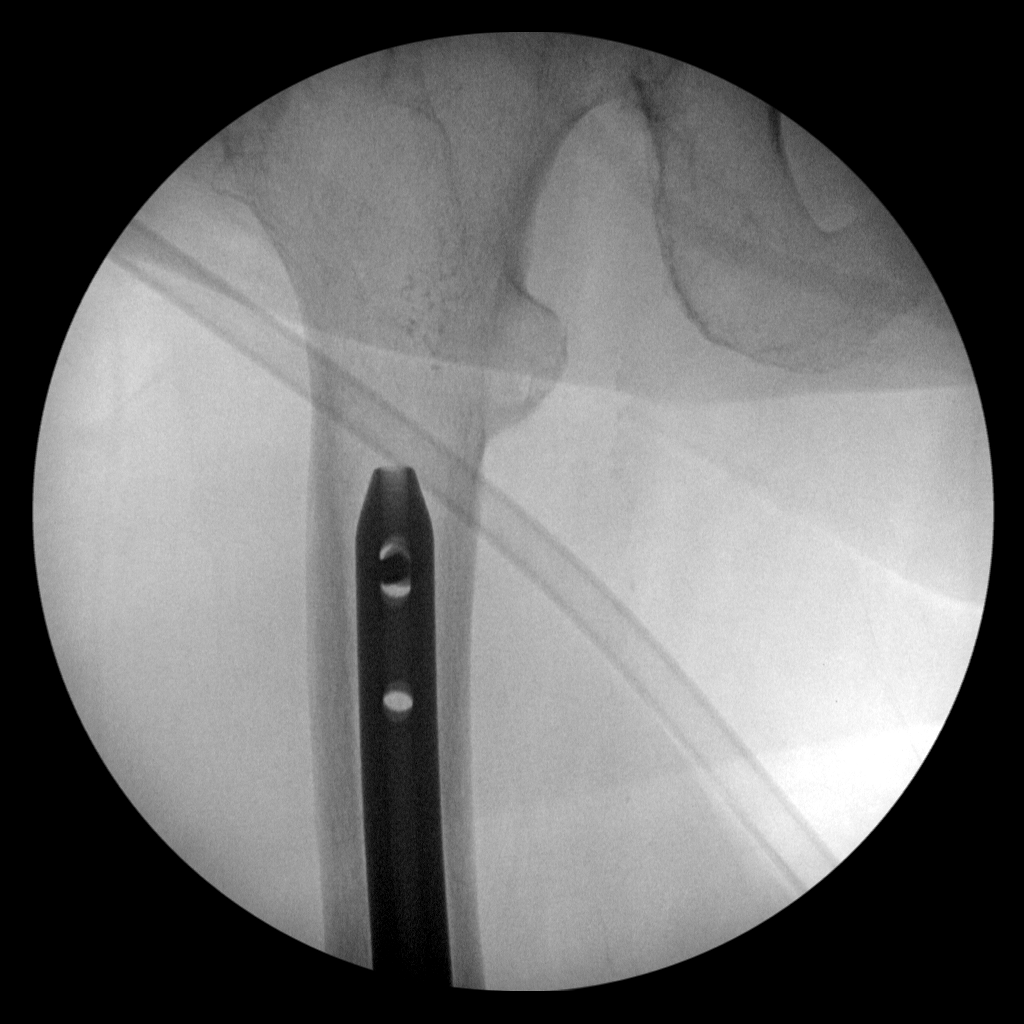

[4 of 4 positions shown; findings below may reference images not displayed]

FINDINGS: Left femur retrograde nail is present with proximal and
distal interlocking screws.  There is also a right femoral
retrograde nail with proximal and distal interlocking screws.
IMPRESSION: Bilateral retrograde femoral nails.

## 2015-04-21 IMAGING — CR DG CHEST 1V PORT
1 series · 1 of 1 positions shown · non-contrast
Comparison: 01/28/2013.

CLINICAL DATA: Unable to hear the patient's lungs sounds due to her
combativeness.  Given fluid.

PORTABLE CHEST - 1 VIEW

[AP]
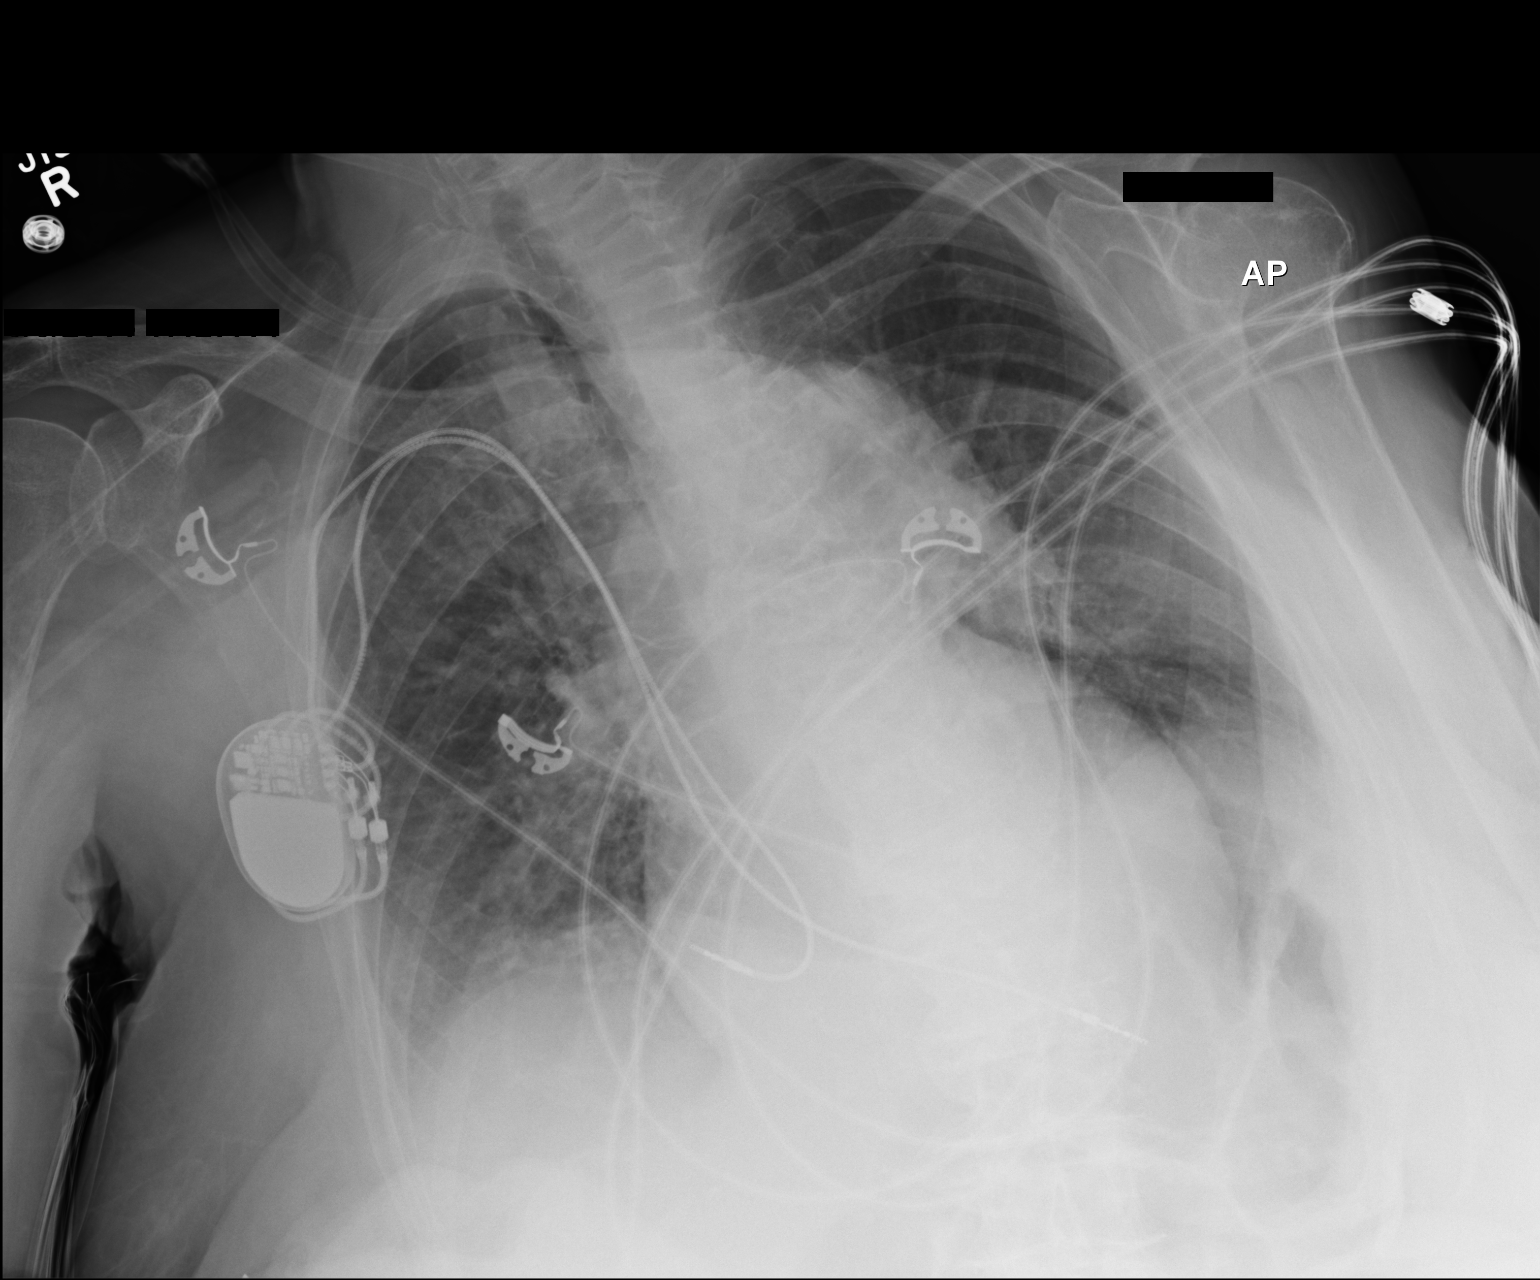

[1 of 1 positions shown; findings below may reference images not displayed]

FINDINGS: Interval mild cardiomegaly and increased prominence of
the pulmonary vasculature and interstitial markings, including some
Kerley lines.  Stable right subclavian pacemaker leads.  Thoracic
spine degenerative changes.  Diffuse osteopenia.
IMPRESSION: Interval mild cardiomegaly and mild changes of congestive heart
failure.
